# Patient Record
Sex: Female | Born: 1955 | Race: White | Hispanic: No | Marital: Married | Smoking: Never smoker
Health system: Southern US, Community
[De-identification: ages and names within clinical notes are randomized; demographics above are authoritative.]

## PROBLEM LIST (undated history)

## (undated) DIAGNOSIS — D250 Submucous leiomyoma of uterus: Secondary | ICD-10-CM

## (undated) DIAGNOSIS — M7752 Other enthesopathy of left foot: Secondary | ICD-10-CM

## (undated) DIAGNOSIS — G8918 Other acute postprocedural pain: Secondary | ICD-10-CM

## (undated) DIAGNOSIS — C801 Malignant (primary) neoplasm, unspecified: Secondary | ICD-10-CM

## (undated) DIAGNOSIS — D638 Anemia in other chronic diseases classified elsewhere: Secondary | ICD-10-CM

## (undated) DIAGNOSIS — E039 Hypothyroidism, unspecified: Secondary | ICD-10-CM

## (undated) DIAGNOSIS — E119 Type 2 diabetes mellitus without complications: Secondary | ICD-10-CM

## (undated) DIAGNOSIS — N289 Disorder of kidney and ureter, unspecified: Secondary | ICD-10-CM

## (undated) DIAGNOSIS — I10 Essential (primary) hypertension: Secondary | ICD-10-CM

## (undated) DIAGNOSIS — G4730 Sleep apnea, unspecified: Secondary | ICD-10-CM

## (undated) HISTORY — PX: TUMOR REMOVAL: SHX12

---

## 2017-03-27 ENCOUNTER — Encounter

## 2017-04-03 ENCOUNTER — Inpatient Hospital Stay: Admit: 2017-04-03 | Payer: BLUE CROSS/BLUE SHIELD | Attending: Sports Medicine | Primary: "Endocrinology

## 2017-04-03 DIAGNOSIS — Q6689 Other  specified congenital deformities of feet: Secondary | ICD-10-CM

## 2017-07-29 ENCOUNTER — Inpatient Hospital Stay
Admit: 2017-07-29 | Discharge: 2017-07-29 | Disposition: A | Discharge status: Home / Self Care | Payer: BLUE CROSS/BLUE SHIELD | Attending: Emergency Medicine

## 2017-07-29 ENCOUNTER — Emergency Department: Admit: 2017-07-29 | Payer: BLUE CROSS/BLUE SHIELD | Primary: "Endocrinology

## 2017-07-29 DIAGNOSIS — S86811A Strain of other muscle(s) and tendon(s) at lower leg level, right leg, initial encounter: Secondary | ICD-10-CM

## 2017-07-29 MED ORDER — HYDROCODONE-ACETAMINOPHEN 5 MG-325 MG TAB
5-325 mg | ORAL_TABLET | Freq: Three times a day (TID) | ORAL | 0 refills | Status: DC | PRN
Start: 2017-07-29 — End: 2017-08-04

## 2017-07-29 NOTE — ED Provider Notes (Signed)
Patient is a 61 y.o. female presenting with knee injury. The history is provided by the patient.   Knee Injury    This is a new problem. The current episode started yesterday. The problem has been gradually worsening. The pain is present in the right knee. The quality of the pain is described as aching. The pain is moderate. Associated symptoms include limited range of motion. Pertinent negatives include no numbness, no back pain and no neck pain. The symptoms are aggravated by standing. She has tried nothing for the symptoms. There has been no history of extremity trauma.      Patient reports yesterday while exiting a car, she was stepping out of the vehicle when placing right leg on the ground she felt a pop. She was ambulatory however had pain with weightbearing, went to a movie yesterday requiring a cane for ambulation and this morning woke up with significant amount of discomfort. Due to kidney disease she limits her medications. Other than the ice heat  Bio freeze and topical rubs no other agents were used.    No past medical history on file.    No past surgical history on file.      No family history on file.    Social History     Social History   ??? Marital status: MARRIED     Spouse name: N/A   ??? Number of children: N/A   ??? Years of education: N/A     Occupational History   ??? Not on file.     Social History Main Topics   ??? Smoking status: Not on file   ??? Smokeless tobacco: Not on file   ??? Alcohol use Not on file   ??? Drug use: Not on file   ??? Sexual activity: Not on file     Other Topics Concern   ??? Not on file     Social History Narrative         ALLERGIES: Bactrim [sulfamethoprim]; Sulfa (sulfonamide antibiotics); Demerol [meperidine]; Macrobid [nitrofurantoin monohyd/m-cryst]; and Macrodantin [nitrofurantoin macrocrystal]    Review of Systems   Constitutional: Negative for activity change, appetite change, chills, diaphoresis and fatigue.    Respiratory: Negative for apnea, cough, choking and chest tightness.    Musculoskeletal: Positive for gait problem. Negative for back pain, joint swelling, myalgias, neck pain and neck stiffness.   Neurological: Negative for numbness.       Vitals:    07/29/17 1057   BP: 168/78   Pulse: 94   Resp: 18   Temp: 97.7 ??F (36.5 ??C)   SpO2: 96%   Weight: 127 kg (280 lb)   Height: 5\' 8"  (1.727 m)            Physical Exam   Constitutional: She is oriented to person, place, and time. She appears well-developed and well-nourished. No distress.   HENT:   Head: Normocephalic and atraumatic.   Eyes: Pupils are equal, round, and reactive to light.   Musculoskeletal:        Right knee: She exhibits decreased range of motion and swelling. She exhibits no effusion, no ecchymosis, no deformity, no laceration and no erythema. Tenderness found. Lateral joint line tenderness noted. No LCL tenderness noted.   Neurological: She is alert and oriented to person, place, and time.   Skin: Skin is warm and dry. No rash noted. She is not diaphoretic. No erythema. No pallor.   Psychiatric: She has a normal mood and affect. Her behavior is normal. Judgment  and thought content normal.        MDM    Reviewed film results with patient, placed in knee immobilizer and crutches, PRn pain medications and follow up with Orthopedics.   ED Course       Procedures

## 2017-07-29 NOTE — ED Triage Notes (Signed)
Twisted right knee yesterday getting out of car, progressively worsened, tried ice, heat and RX medication. NO relief and this morning unable to bear weight on RLE.

## 2017-07-29 NOTE — ED Notes (Signed)
I have reviewed discharge instructions with the patient.  The patient verbalized understanding. Pt confirmed understanding of need for follow up with primary care provider.  Important sections of discharge instructions highlighted for patient.   Chauncy Lean, RN      Pt is not in any current distress and shows no evidence of clinical instability.   Pt is hemodynamically/respiratorily stable.      Armband removed.    Paperwork given by provider and reviewed with patient had the  opportunity for questions/clarification given.     Pt denies any additional complaints.     Pt was wheeled out of ED.    Patient Vitals for the past 4 hrs:   Temp Pulse Resp BP SpO2   07/29/17 1057 97.7 ??F (36.5 ??C) 94 18 168/78 96 %         Chauncy Lean, RN

## 2017-07-29 NOTE — ED Notes (Signed)
Pt had knee immobilizer placed on right knee, pt had good pms pre and post application. Pt given crutches with instructions.

## 2017-08-04 ENCOUNTER — Inpatient Hospital Stay
Admit: 2017-08-04 | Discharge: 2017-08-05 | Disposition: A | Discharge status: Home / Self Care | Payer: BLUE CROSS/BLUE SHIELD | Attending: Emergency Medicine

## 2017-08-04 DIAGNOSIS — D62 Acute posthemorrhagic anemia: Secondary | ICD-10-CM

## 2017-08-04 DIAGNOSIS — E1129 Type 2 diabetes mellitus with other diabetic kidney complication: Secondary | ICD-10-CM

## 2017-08-04 DIAGNOSIS — E039 Hypothyroidism, unspecified: Secondary | ICD-10-CM | POA: Diagnosis present

## 2017-08-04 LAB — CBC WITH AUTOMATED DIFF
ABS. BASOPHILS: 0 10*3/uL (ref 0.0–0.1)
ABS. EOSINOPHILS: 0.1 10*3/uL (ref 0.0–0.4)
ABS. IMM. GRANS.: 0.1 10*3/uL — ABNORMAL HIGH (ref 0.00–0.04)
ABS. LYMPHOCYTES: 1.2 10*3/uL (ref 0.8–3.5)
ABS. MONOCYTES: 0.7 10*3/uL (ref 0.0–1.0)
ABS. NEUTROPHILS: 11 10*3/uL — ABNORMAL HIGH (ref 1.8–8.0)
ABSOLUTE NRBC: 0 10*3/uL (ref 0.00–0.01)
BASOPHILS: 0 % (ref 0–1)
EOSINOPHILS: 1 % (ref 0–7)
HCT: 21.7 % — ABNORMAL LOW (ref 35.0–47.0)
HGB: 6.6 g/dL — ABNORMAL LOW (ref 11.5–16.0)
IMMATURE GRANULOCYTES: 1 % — ABNORMAL HIGH (ref 0.0–0.5)
LYMPHOCYTES: 9 % — ABNORMAL LOW (ref 12–49)
MCH: 25.4 PG — ABNORMAL LOW (ref 26.0–34.0)
MCHC: 30.4 g/dL (ref 30.0–36.5)
MCV: 83.5 FL (ref 80.0–99.0)
MONOCYTES: 5 % (ref 5–13)
MPV: 9.2 FL (ref 8.9–12.9)
NEUTROPHILS: 84 % — ABNORMAL HIGH (ref 32–75)
NRBC: 0 PER 100 WBC
PLATELET: 406 10*3/uL — ABNORMAL HIGH (ref 150–400)
RBC: 2.6 M/uL — ABNORMAL LOW (ref 3.80–5.20)
RDW: 14.1 % (ref 11.5–14.5)
WBC: 13.1 10*3/uL — ABNORMAL HIGH (ref 3.6–11.0)

## 2017-08-04 LAB — METABOLIC PANEL, BASIC
Anion gap: 6 mmol/L (ref 5–15)
BUN/Creatinine ratio: 20 (ref 12–20)
BUN: 53 MG/DL — ABNORMAL HIGH (ref 6–20)
CO2: 25 mmol/L (ref 21–32)
Calcium: 8.7 MG/DL (ref 8.5–10.1)
Chloride: 101 mmol/L (ref 97–108)
Creatinine: 2.6 MG/DL — ABNORMAL HIGH (ref 0.55–1.02)
GFR est AA: 23 mL/min/{1.73_m2} — ABNORMAL LOW (ref >60)
GFR est non-AA: 19 mL/min/{1.73_m2} — ABNORMAL LOW (ref >60)
Glucose: 360 mg/dL — ABNORMAL HIGH (ref 65–100)
Potassium: 5 mmol/L (ref 3.5–5.1)
Sodium: 132 mmol/L — ABNORMAL LOW (ref 136–145)

## 2017-08-04 MED ORDER — ACETAMINOPHEN 325 MG TABLET
325 mg | ORAL | Status: DC | PRN
Start: 2017-08-04 — End: 2017-08-05
  Administered 2017-08-05: 16:00:00 via ORAL

## 2017-08-04 MED ORDER — ONDANSETRON (PF) 4 MG/2 ML INJECTION
4 mg/2 mL | INTRAMUSCULAR | Status: DC | PRN
Start: 2017-08-04 — End: 2017-08-05

## 2017-08-04 MED ORDER — MORPHINE 4 MG/ML INTRAVENOUS SOLUTION
4 mg/mL | INTRAVENOUS | Status: DC | PRN
Start: 2017-08-04 — End: 2017-08-05

## 2017-08-04 MED ORDER — DEXTROSE 50% IN WATER (D50W) IV SYRG
INTRAVENOUS | Status: DC | PRN
Start: 2017-08-04 — End: 2017-08-05

## 2017-08-04 MED ORDER — INSULIN GLARGINE 100 UNIT/ML INJECTION
100 unit/mL | Freq: Every evening | SUBCUTANEOUS | Status: DC
Start: 2017-08-04 — End: 2017-08-05
  Administered 2017-08-05: 03:00:00 via SUBCUTANEOUS

## 2017-08-04 MED ORDER — LEVOTHYROXINE 125 MCG TAB
125 mcg | Freq: Every day | ORAL | Status: DC
Start: 2017-08-04 — End: 2017-08-05

## 2017-08-04 MED ORDER — SODIUM CHLORIDE 0.9 % IJ SYRG
INTRAMUSCULAR | Status: DC | PRN
Start: 2017-08-04 — End: 2017-08-05

## 2017-08-04 MED ORDER — LIRAGLUTIDE 0.6 MG/0.1 ML (18 MG/3 ML) SUB-Q PEN INJECTOR
0.6 mg/0.1 mL (18 mg/3 mL) | Freq: Every evening | SUBCUTANEOUS | Status: DC
Start: 2017-08-04 — End: 2017-08-05
  Administered 2017-08-05: 03:00:00 via SUBCUTANEOUS

## 2017-08-04 MED ORDER — GLUCOSE 4 GRAM CHEWABLE TAB
4 gram | ORAL | Status: DC | PRN
Start: 2017-08-04 — End: 2017-08-05

## 2017-08-04 MED ORDER — NALOXONE 0.4 MG/ML INJECTION
0.4 mg/mL | INTRAMUSCULAR | Status: DC | PRN
Start: 2017-08-04 — End: 2017-08-05

## 2017-08-04 MED ORDER — GABAPENTIN 100 MG CAP
100 mg | Freq: Two times a day (BID) | ORAL | Status: DC
Start: 2017-08-04 — End: 2017-08-05
  Administered 2017-08-05 (×2): via ORAL

## 2017-08-04 MED ORDER — LOSARTAN 50 MG TAB
50 mg | Freq: Every day | ORAL | Status: DC
Start: 2017-08-04 — End: 2017-08-05

## 2017-08-04 MED ORDER — INSULIN LISPRO 100 UNIT/ML INJECTION
100 unit/mL | Freq: Four times a day (QID) | SUBCUTANEOUS | Status: DC
Start: 2017-08-04 — End: 2017-08-05
  Administered 2017-08-05: 03:00:00 via SUBCUTANEOUS

## 2017-08-04 MED ORDER — LEVOTHYROXINE 75 MCG TAB
75 mcg | Freq: Every day | ORAL | Status: DC
Start: 2017-08-04 — End: 2017-08-05
  Administered 2017-08-05: 11:00:00 via ORAL

## 2017-08-04 MED ORDER — SODIUM CHLORIDE 0.9 % IJ SYRG
Freq: Three times a day (TID) | INTRAMUSCULAR | Status: DC
Start: 2017-08-04 — End: 2017-08-05
  Administered 2017-08-05 (×2): via INTRAVENOUS

## 2017-08-04 MED ORDER — DIPHENHYDRAMINE HCL 50 MG/ML IJ SOLN
50 mg/mL | INTRAMUSCULAR | Status: DC | PRN
Start: 2017-08-04 — End: 2017-08-05

## 2017-08-04 MED ORDER — SODIUM CHLORIDE 0.9 % IV
INTRAVENOUS | Status: DC | PRN
Start: 2017-08-04 — End: 2017-08-05

## 2017-08-04 MED ORDER — GLUCAGON 1 MG INJECTION
1 mg | INTRAMUSCULAR | Status: DC | PRN
Start: 2017-08-04 — End: 2017-08-05

## 2017-08-04 MED ORDER — CHOLECALCIFEROL (VITAMIN D3) 1,000 UNIT (25 MCG) TAB
Freq: Every day | ORAL | Status: DC
Start: 2017-08-04 — End: 2017-08-05
  Administered 2017-08-05: 16:00:00 via ORAL

## 2017-08-04 MED FILL — SODIUM CHLORIDE 0.9 % IV: INTRAVENOUS | Qty: 250

## 2017-08-04 MED FILL — BD POSIFLUSH NORMAL SALINE 0.9 % INJECTION SYRINGE: INTRAMUSCULAR | Qty: 10

## 2017-08-04 NOTE — ED Triage Notes (Signed)
Pt states she started her period yesterday and has been having vaginal bleeding and clots and feels faint. Pt states she has been soaking several pads in an hour.

## 2017-08-04 NOTE — H&P (Signed)
[facility]  [address], [place]  [address]  [phone]    Admission History and Physical      NAME:              Evelyn Jones   DOB:   10-11-1956   MRN:  [id number]     PCP:  Cannon Kettle, MD     Date:     08/04/2017     Chief  Complaint: Fatigue, heavy menstrual bleeding    History Of Presenting Illness:       Evelyn Jones is a 61 y.o. female who is being admitted for symptomatic acute blood loss anemia. Evelyn Jones has a Hx of uterine fibroids and a thickened endometrial lining and follows up with Dr Jackie Plum. She says she started having heavy menstrual bleeding last night consisting of both fresh blood as well as clotted blood. This has not stopped since. She has been feeling weak, dizzy and with a poor exercise tolerance and presented to our Emergency Department today where she was found to have a Hgb of 6.6. She will be admitted for transfusion and a gynecological evaluation.     Allergies   Allergen Reactions   ??? Bactrim [Sulfamethoprim] Anaphylaxis   ??? Sulfa (Sulfonamide Antibiotics) Anaphylaxis   ??? Compazine [Prochlorperazine] Rash   ??? Demerol [Meperidine] Other (comments)     Agitation and nervousness   ??? Keflex [Cephalexin] Hives   ??? Macrobid [Nitrofurantoin Monohyd/M-Cryst] Hives   ??? Macrodantin [Nitrofurantoin Macrocrystal] Hives       Prior to Admission medications    Medication Sig Start Date End Date Taking? Authorizing Provider   loratadine (CLARITIN) 10 mg tablet Take 10 mg by mouth.   Yes Phys Other, MD   levothyroxine (SYNTHROID) 150 mcg tablet Take  by mouth Daily (before breakfast).   Yes Phys Other, MD   valsartan (DIOVAN) 160 mg tablet Take  by mouth daily.   Yes Phys Other, MD   repaglinide (PRANDIN) 2 mg tablet Take 2 mg by mouth Before breakfast, lunch, and dinner.   Yes Phys Other, MD    furosemide (LASIX) 20 mg tablet Take  by mouth daily.   Yes Phys Other, MD   gabapentin (NEURONTIN) 100 mg capsule Take 200 mg by mouth three (3) times daily.   Yes Phys Other, MD   cholecalciferol, vitamin D3, (VITAMIN D3) 2,000 unit tab Take  by mouth.   Yes Phys Other, MD   insulin glargine (LANTUS U-100 INSULIN) 100 unit/mL injection 28 Units by SubCUTAneous route nightly.   Yes Phys Other, MD   Liraglutide (VICTOZA 2-PAK) 0.6 mg/0.1 mL (18 mg/3 mL) pnij 0.6 mg by SubCUTAneous route.   Yes Phys Other, MD       Past Medical History:   Diagnosis Date   ??? Chronic kidney disease     stage 3    ??? DM type 2 (diabetes mellitus, type 2) ([place])    ??? Hypertension    ??? Ill-defined condition     anemia        Past Surgical History:   Procedure Laterality Date   ??? HX CESAREAN SECTION      c section 1993   ??? HX GYN      D&C 1988   ??? HX ORTHOPAEDIC      left knee surgery       Social History   Substance Use Topics   ??? Smoking status: Never Smoker   ???  Smokeless tobacco: Never Used   ??? Alcohol use No        Family History   Problem Relation Age of Onset   ??? Cancer Mother    ??? Cancer Father         Review of Systems:    Constitutional ROS: no fever, chills, rigors or night sweats  Respiratory ROS: no cough, sputum, hemoptysis, dyspnea or pleuritic pain.  Cardiovascular ROS: no chest pain, orthopnea, PND or syncope. + palpitations  Endocrine ROS: no polydispsia, polyuria, heat or cold intolerance or major weight change.  Gastrointestinal ROS: no dysphagia, abdominal pain, nausea, vomiting or diarrhea    Genito-Urinary ROS: no dysuria, frequency, hematuria, retention or flank pain but vaginal bleeding  Musculoskeletal ROS: no joint pain, swelling or muscular tenderness  Neurological ROS: + headache but no confusion, focal weakness or any other neurological symptoms  Psychiatric ROS: no depression, anxiety, mood swings  Dermatological ROS: no rash, pruritis, or urticaria  Heme-Lymph ROS: no swollen glands, bleeding    Examination:     Constitutional:    Visit Vitals   ??? BP 155/86   ??? Pulse (!) 112   ??? Temp 98.6 ??F (37 ??C)   ??? Resp 18   ??? Ht 5\' 8"  (1.727 m)   ??? Wt 130.6 kg (288 lb)   ??? SpO2 95%   ??? BMI 43.79 kg/m2         General:  Weak and ill looking patient in no acute distress  Eyes: Pale conjunctivae, PERRLA with no discharge. Normal eye movements  Ear, Nose, Mouth & Throat: No ottorrhea, rhinorrhea, non tender sinuses, dry mucous membranes  Respiratory:  No accessory muscle use, clear breath sounds without crackles or wheezes  Cardiovascular:  No JVD or murmurs, sinus tachycardia, without thrills, bruits. + peripheral edema.   GI & GU:  Soft abdomen, obese, non-tender, non-distended, normoactive bowel sounds with no palpable organomegaly  Heme:  No cervical or axillary adenopathy.   Musculoskeletal:  No cyanosis, clubbing, atrophy or deformities  Skin:  No rashes, bruising or ulcers   Neurological: Awake and alert, speech is clear, CNs 2-12 are grossly intact and otherwise non focal  Psychiatric:  Has a good insight and is oriented x 3  ________________________________________________________________________    Data Review:    Labs:    Recent Labs      08/04/17   1537   WBC  13.1*   HGB  6.6*   HCT  21.7*   PLT  406*     Recent Labs      08/04/17   1537   NA  132*   K  5.0   CL  101   CO2  25   GLU  360*   BUN  53*   CREA  2.60*   CA  8.7     No components found for: GLPOC  No results for input(s): PH, PCO2, PO2, HCO3, FIO2 in the last 72 hours.  No results for input(s): INR in the last 72 hours.    No lab exists for component: INREXT    Other Medical tests:    Personally reviewed 12 lead EKG - sinus tachycardia with no ischemic changes    I have also reviewed available old medical records.     Assessment & Impression:     Evelyn Jones is a 61 y.o. female being evaluated for:     Active Problems:    CKD (chronic kidney disease) stage 4, GFR 15-29 ml/min (  [place]) (08/04/2017)      Type 2 diabetes mellitus with kidney complication, with long-term  current use of insulin ([place]) (08/04/2017)      Acute blood loss anemia (08/04/2017)      Vaginal bleeding, abnormal (08/04/2017)      HTN (hypertension), benign (08/04/2017)      Hyponatremia (08/04/2017)         Plan of management:    Acute blood loss anemia (08/04/2017): due to vaginal bleeding. She may also have a chronic component to this related to her CKD. Now symptomatic. Admit to hospital. Transfuse 2 units PRBCs. Follow Hgb    Vaginal bleeding, abnormal (08/04/2017): in the setting of uterine fibroids and endometrial thickening by Hx. ED has already discussed with Dr Ferrel Logan, covering for Dr Owens Shark. Team will review patient. Follow Hgb as above    HTN (hypertension), benign (08/04/2017): BP elevated. DC Valsartan due to recall. Start Losartan and follow BP    Hyponatremia (08/04/2017): mild. Maybe related to her CKD and diuretic use. Monitor    Type 2 diabetes mellitus with kidney complication, with long-term current use of insulin ([place]) (08/04/2017): A1c useless in the setting of anemia. Monitor blood glucose, SSI per protocol. DC Prandin. Resume Lantus and perhaps adjust dose, SSI per protocol, diabetic diet    Hypothyroidism (08/04/2017): resume Levothyroxine    CKD (chronic kidney disease) stage 4, GFR 15-29 ml/min ([facility]) (08/04/2017): outpatient follow up.     Code Status:  Full    Surrogate decision maker: Family    Risk of deterioration: high      Total time spent for the care of the patient: [place] discussed with: Patient, Family, Nursing Staff and ED physician    Discussed:  Code Status, Care Plan and D/C Planning    Prophylaxis:  SCD's    Probable Disposition:  Home w/Family           ___________________________________________________    Attending Physician: Wilber Bihari, MD

## 2017-08-04 NOTE — ED Notes (Signed)
TRANSFER - OUT REPORT:    Verbal report given to [place] RN on Evelyn Jones  being transferred to 524 for routine progression of care       Report consisted of patient???s Situation, Background, Assessment and   Recommendations(SBAR).     Information from the following report(s) SBAR, ED Summary, [facility] and Recent Results was reviewed with the receiving nurse.    Opportunity for questions and clarification was provided.

## 2017-08-04 NOTE — Progress Notes (Signed)
Pt. hgb 7.0 post 1st unit of RBC transfusion. RN notifies Dr. Percell Miller. MD states to complete 2nd transfusion now. Will continue to monitor.

## 2017-08-04 NOTE — Progress Notes (Addendum)
[facility]: MED RECONCILIATION    Comments/Recommendations:  ?? Patient reports compliance with current medications. Took last doses this morning.  ?? Hypertension - Patient aware that Claritin D (which contains pseudoephedrine) will adversely affect her BP, but it significantly helps her sinuses. Patient is prescribed valsartan 160 mg PO BID, however she is only taking this once daily currently. She thinks she might have just been taking it once daily by mistake.     Medications added:     ?? Claritin D (12 hour formulation) - only takes once daily usually  ?? Voltaren gel  ?? Biofreeze cream    Medications removed:    ?? Loratadine - takes Claritin D    Medications adjusted:    ?? Vitamin D3 - changed to 5000 units daily  ?? Victoza - changed dose to 1.8 mg QHS  ?? Repaglinide - changed dose to 4 mg (two of the 2 mg tablets) PO TID before meals    Information obtained from: patient, medication list from home, Rx query    Significant PMH/Disease States:   Past Medical History:   Diagnosis Date   ??? Chronic kidney disease     stage 3    ??? DM type 2 (diabetes mellitus, type 2) ([place])    ??? Hypertension    ??? Ill-defined condition     anemia     Chief Complaint for this Admission:   Chief Complaint   Patient presents with   ??? Vaginal Bleeding     Allergies: Bactrim [sulfamethoprim]; Sulfa (sulfonamide antibiotics); Compazine [prochlorperazine]; Demerol [meperidine]; Keflex [cephalexin]; Macrobid [nitrofurantoin monohyd/m-cryst]; and Macrodantin [nitrofurantoin macrocrystal]    Prior to Admission Medications:     Medication Documentation Review Audit       Reviewed by Koleen Distance, PHARMD (Pharmacist) on 08/04/17 at 1708         Medication Sig Documenting Provider Last Dose Status Taking?      cholecalciferol, VITAMIN D3, (VITAMIN D3) 5,000 unit tab tablet Take 5,000 Units by mouth daily (with lunch). Historical Provider 08/03/2017 lunch Active Yes    diclofenac (VOLTAREN) 1 % gel Apply 2 g to affected area two (2) times a  day. Apply to knees and feet Historical Provider 08/04/2017 am Active Yes    furosemide (LASIX) 20 mg tablet Take 20 mg by mouth two (2) times a day. Phys Other, MD 08/04/2017 am Active Yes    gabapentin (NEURONTIN) 100 mg capsule Take 200 mg by mouth two (2) times a day. Phys Other, MD 08/04/2017 am Active Yes    insulin glargine (LANTUS U-100 INSULIN) 100 unit/mL injection 28 Units by SubCUTAneous route nightly. Phys Other, MD 08/03/2017 pm Active Yes    levothyroxine (SYNTHROID) 125 mcg tablet Take 125 mcg by mouth daily (after lunch). Take 150 mcg in the morning and 125 mcg in the afternoon Historical Provider 08/03/2017 1500 Active Yes    levothyroxine (SYNTHROID) 150 mcg tablet Take 150 mcg by mouth Daily (before breakfast). Take 150 mcg in the morning and 125 mcg in the afternoon Phys Other, MD 08/04/2017 am Active Yes    Liraglutide (VICTOZA 2-PAK) 0.6 mg/0.1 mL (18 mg/3 mL) pnij 1.8 mg by SubCUTAneous route nightly. Phys Other, MD 08/03/2017 pm Active Yes             Med Note Glo Herring, BRAD L   Sat Aug 04, 2017  5:05 PM): Patient can provide medication from home ir ordered      loratadine-pseudoephedrine (CLARITIN-D 12 HOUR) 5-120 mg per tablet  Take 1 Tab by mouth daily. Historical Provider 08/04/2017 am Active Yes    menthol (BIOFREEZE, MENTHOL,) 4 % gel by Apply Externally route daily as needed. Historical Provider  Active Yes    repaglinide (PRANDIN) 2 mg tablet Take 4 mg by mouth Before breakfast, lunch, and dinner. Phys Other, MD 08/04/2017 am Active Yes    valsartan (DIOVAN) 160 mg tablet Take 160 mg by mouth daily. Phys Other, MD 08/04/2017 am Active Yes             Med Note Koleen Distance   Sat Aug 04, 2017  5:06 PM): Takes once daily but prescribed twice daily                    Thank you for the consult,  Brad L. Felecia Shelling D, [place]

## 2017-08-04 NOTE — Progress Notes (Signed)
TRANSFER - IN REPORT:    Verbal report received from Amanda(name) on Narissa Beaufort  being received from ED(unit) for routine progression of care      Report consisted of patient???s Situation, Background, Assessment and   Recommendations(SBAR).     Information from the following report(s) SBAR, Kardex, ED Summary, Acc[facility]ion and Recent Results was reviewed with the receiving nurse.    Opportunity for questions and clarification was provided.      Assessment completed upon patient???s arrival to unit and care assumed.

## 2017-08-04 NOTE — Other (Signed)
Primary Nurse Alfonzo Feller and Kait, RN performed a dual skin assessment on this patient No impairment noted  Braden score is 21

## 2017-08-04 NOTE — ED Notes (Signed)
Attempted to call report, unable to receive at this time

## 2017-08-04 NOTE — ED Provider Notes (Signed)
HPI Comments: 61 y.o. female with past medical history significant for hypertension, diabetes, and stage 3 chronic kidney disease who presents from home via a private vehicle with chief complaint of excessive menstrual bleeding. Pt reports she started her menstrual period yesterday around 2300 and has had unusual, excessive amounts of bleeding. Pt reports she has been going through 3-4 soaked pads every hour since yesterday. Furthermore, per spouse, pt has passed well-formed, palm sized blood clots. Pt reports feeling as if she has lost a significant amount of bleed and feels lightheaded. Pt admits she is anemic at baseline, but is unsure of what her hemoglobin is. Pt reports her last normal menstrual period was 4 weeks ago and it lasted 5 days. Pt states she does not typically have heavy periods. Pt denies being on any aspirin or other anticoagulants. Pt denies any nausea, vomiting, vaginal discharge, or syncope.    There are no other acute medical concerns at this time.    Social hx - Tobacco use: none, Alcohol Use: none    OB/GYN: Jackie Plum, MD  Nephrologist: Jinger Neighbors, MD  PCP: Cannon Kettle, MD    Note written by Kemper Durie, Scribe, as dictated by Dennison Mascot, MD 3:14 PM.        The history is provided by the patient and the spouse. No language interpreter was used.        No past medical history on file.    No past surgical history on file.      No family history on file.    Social History     Social History   ??? Marital status: MARRIED     Spouse name: N/A   ??? Number of children: N/A   ??? Years of education: N/A     Occupational History   ??? Not on file.     Social History Main Topics   ??? Smoking status: Not on file   ??? Smokeless tobacco: Not on file   ??? Alcohol use Not on file   ??? Drug use: Not on file   ??? Sexual activity: Not on file     Other Topics Concern   ??? Not on file     Social History Narrative         ALLERGIES: Bactrim [sulfamethoprim]; Sulfa (sulfonamide antibiotics);  Compazine [prochlorperazine]; Demerol [meperidine]; Macrobid [nitrofurantoin monohyd/m-cryst]; and Macrodantin [nitrofurantoin macrocrystal]    Review of Systems   Constitutional: Negative for chills and fever.   HENT: Negative for ear pain and sore throat.    Eyes: Negative for pain.   Respiratory: Negative for chest tightness and shortness of breath.    Cardiovascular: Negative for chest pain and leg swelling.   Gastrointestinal: Negative for abdominal pain, nausea and vomiting.   Genitourinary: Positive for menstrual problem and vaginal bleeding. Negative for dysuria, flank pain and vaginal discharge.   Musculoskeletal: Negative for back pain.   Skin: Negative for rash.   Neurological: Negative for syncope and headaches.   All other systems reviewed and are negative.      Vitals:    08/04/17 1442   BP: 197/88   Pulse: (!) 112   Resp: 18   Temp: 98.6 ??F (37 ??C)   SpO2: 98%   Weight: 130.6 kg (288 lb)   Height: 5\' 8"  (1.727 m)            Physical Exam   Constitutional: She appears well-developed and well-nourished. No distress.   HENT:   Head:  Normocephalic and atraumatic.   Eyes:   Pt has conjunctival pallor.    Neck: Neck supple.   Cardiovascular: Regular rhythm.  Tachycardia present.    Mildly tachycardic.    Pulmonary/Chest: Effort normal. No stridor. No respiratory distress.   Abdominal: Soft. She exhibits no distension. There is no tenderness. There is no rebound and no guarding.   Genitourinary:   Genitourinary Comments: Pelvic: clots present, no pooling, no adnexal tenderness   Musculoskeletal: Normal range of motion.   Neurological: She is alert. Coordination normal.   Skin: Skin is warm and dry.   Psychiatric: She has a normal mood and affect.   Nursing note and vitals reviewed.     Note written by Kemper Durie, Scribe, as dictated by Dennison Mascot, MD 3:15 PM.    MDM    61 y.o. female presents with menstrual bleeding starting yesterday, h/o  CKD and baseline anemia. Hb <7 here warranting transfusion. Discussed with Gyn who recommend treatment of anemia and monitoring but no acute intervention. Hospitalist was consulted for admission and will see the patient in the emergency department.     ED Course       Procedures    ED EKG interpretation:  2:48 PM  Rhythm: sinus tachycardia; and regular . Rate (approx.): 107; no other abnormalities  Note written by Kemper Durie, Scribe, as dictated by Dennison Mascot, MD 3:15 PM.    CONSULT NOTE:  4:14 PM Dennison Mascot, MD spoke with Dr. Dr. Ferrel Logan, Consult for OB/Gynecology.  Discussed available diagnostic tests and clinical findings.  Dr. Ferrel Logan recommends admitting the pt to the hospitalist to treat the anemia.    CONSULT NOTE:  4:17 PM Dennison Mascot, MD spoke with Dr. Odette Horns, Consult for Hospitalist.  Discussed available diagnostic tests and clinical findings.  Dr. Odette Horns will see and admit the pt.     5:45 PM  Patient is being admitted to the hospital.  The results of their tests and reasons for their admission have been discussed with them and/or available family.  They convey agreement and understanding for the need to be admitted and for their admission diagnosis.  Consultation will be made now with the inpatient physician for hospitalization.

## 2017-08-05 LAB — CBC WITH AUTOMATED DIFF
ABS. BASOPHILS: 0.1 10*3/uL (ref 0.0–0.1)
ABS. EOSINOPHILS: 0.4 10*3/uL (ref 0.0–0.4)
ABS. IMM. GRANS.: 0.1 10*3/uL — ABNORMAL HIGH (ref 0.00–0.04)
ABS. LYMPHOCYTES: 2.4 10*3/uL (ref 0.8–3.5)
ABS. MONOCYTES: 1 10*3/uL (ref 0.0–1.0)
ABS. NEUTROPHILS: 7.9 10*3/uL (ref 1.8–8.0)
ABSOLUTE NRBC: 0 10*3/uL (ref 0.00–0.01)
BASOPHILS: 0 % (ref 0–1)
EOSINOPHILS: 3 % (ref 0–7)
HCT: 25.8 % — ABNORMAL LOW (ref 35.0–47.0)
HGB: 8.1 g/dL — ABNORMAL LOW (ref 11.5–16.0)
IMMATURE GRANULOCYTES: 1 % — ABNORMAL HIGH (ref 0.0–0.5)
LYMPHOCYTES: 20 % (ref 12–49)
MCH: 26.4 PG (ref 26.0–34.0)
MCHC: 31.4 g/dL (ref 30.0–36.5)
MCV: 84 FL (ref 80.0–99.0)
MONOCYTES: 9 % (ref 5–13)
MPV: 9.4 FL (ref 8.9–12.9)
NEUTROPHILS: 67 % (ref 32–75)
NRBC: 0 PER 100 WBC
PLATELET: 388 10*3/uL (ref 150–400)
RBC: 3.07 M/uL — ABNORMAL LOW (ref 3.80–5.20)
RDW: 14.1 % (ref 11.5–14.5)
WBC: 11.9 10*3/uL — ABNORMAL HIGH (ref 3.6–11.0)

## 2017-08-05 LAB — IRON PROFILE
Iron % saturation: 65 % — ABNORMAL HIGH (ref 20–50)
Iron: 191 ug/dL — ABNORMAL HIGH (ref 35–150)
TIBC: 292 ug/dL (ref 250–450)

## 2017-08-05 LAB — METABOLIC PANEL, COMPREHENSIVE
A-G Ratio: 0.7 — ABNORMAL LOW (ref 1.1–2.2)
ALT (SGPT): 22 U/L (ref 12–78)
AST (SGOT): 16 U/L (ref 15–37)
Albumin: 2.5 g/dL — ABNORMAL LOW (ref 3.5–5.0)
Alk. phosphatase: 105 U/L (ref 45–117)
Anion gap: 9 mmol/L (ref 5–15)
BUN/Creatinine ratio: 20 (ref 12–20)
BUN: 50 MG/DL — ABNORMAL HIGH (ref 6–20)
Bilirubin, total: 0.5 MG/DL (ref 0.2–1.0)
CO2: 23 mmol/L (ref 21–32)
Calcium: 8.3 MG/DL — ABNORMAL LOW (ref 8.5–10.1)
Chloride: 103 mmol/L (ref 97–108)
Creatinine: 2.51 MG/DL — ABNORMAL HIGH (ref 0.55–1.02)
GFR est AA: 24 mL/min/{1.73_m2} — ABNORMAL LOW (ref >60)
GFR est non-AA: 20 mL/min/{1.73_m2} — ABNORMAL LOW (ref >60)
Globulin: 3.7 g/dL (ref 2.0–4.0)
Glucose: 208 mg/dL — ABNORMAL HIGH (ref 65–100)
Potassium: 4.7 mmol/L (ref 3.5–5.1)
Protein, total: 6.2 g/dL — ABNORMAL LOW (ref 6.4–8.2)
Sodium: 135 mmol/L — ABNORMAL LOW (ref 136–145)

## 2017-08-05 LAB — GLUCOSE, POC
Glucose (POC): 328 mg/dL — ABNORMAL HIGH (ref 65–100)
Glucose (POC): 250 mg/dL — ABNORMAL HIGH (ref 65–100)
Glucose (POC): 250 mg/dL — ABNORMAL HIGH (ref 65–100)

## 2017-08-05 LAB — HEMOGLOBIN A1C WITH EAG
Est. average glucose: 246 mg/dL
Hemoglobin A1c: 10.2 % — ABNORMAL HIGH (ref 4.2–6.3)

## 2017-08-05 LAB — FERRITIN: Ferritin: 21 NG/ML (ref 8–252)

## 2017-08-05 LAB — HGB & HCT
HCT: 22.3 % — ABNORMAL LOW (ref 35.0–47.0)
HGB: 7 g/dL — ABNORMAL LOW (ref 11.5–16.0)

## 2017-08-05 LAB — MAGNESIUM: Magnesium: 2 mg/dL (ref 1.6–2.4)

## 2017-08-05 LAB — PHOSPHORUS: Phosphorus: 3.6 MG/DL (ref 2.6–4.7)

## 2017-08-05 MED ORDER — FUROSEMIDE 20 MG TAB
20 mg | Freq: Two times a day (BID) | ORAL | Status: DC
Start: 2017-08-05 — End: 2017-08-05

## 2017-08-05 MED ORDER — HYDRALAZINE 20 MG/ML IJ SOLN
20 mg/mL | Freq: Four times a day (QID) | INTRAMUSCULAR | Status: DC | PRN
Start: 2017-08-05 — End: 2017-08-05

## 2017-08-05 MED ORDER — LABETALOL 200 MG TAB
200 mg | Freq: Two times a day (BID) | ORAL | Status: DC
Start: 2017-08-05 — End: 2017-08-05
  Administered 2017-08-05: 13:00:00 via ORAL

## 2017-08-05 MED ORDER — INSULIN LISPRO 100 UNIT/ML INJECTION
100 unit/mL | Freq: Four times a day (QID) | SUBCUTANEOUS | Status: DC
Start: 2017-08-05 — End: 2017-08-05
  Administered 2017-08-05 (×2): via SUBCUTANEOUS

## 2017-08-05 MED ORDER — REPAGLINIDE 1 MG TAB
1 mg | Freq: Three times a day (TID) | ORAL | Status: DC
Start: 2017-08-05 — End: 2017-08-05

## 2017-08-05 MED ORDER — IRON SUCROSE 100 MG/5 ML IV SOLN
100 mg iron/5 mL | Freq: Once | INTRAVENOUS | Status: AC
Start: 2017-08-05 — End: 2017-08-05
  Administered 2017-08-05: 16:00:00 via INTRAVENOUS

## 2017-08-05 MED ORDER — LABETALOL 5 MG/ML IV SYRINGE
20 mg/4 mL (5 mg/mL) | INTRAVENOUS | Status: DC | PRN
Start: 2017-08-05 — End: 2017-08-05

## 2017-08-05 MED ORDER — LOSARTAN 50 MG TAB
50 mg | ORAL_TABLET | Freq: Every day | ORAL | 1 refills | Status: DC
Start: 2017-08-05 — End: 2017-09-27

## 2017-08-05 MED ORDER — SODIUM CHLORIDE 0.9 % IV
INTRAVENOUS | Status: DC
Start: 2017-08-05 — End: 2017-08-05
  Administered 2017-08-05: 13:00:00 via INTRAVENOUS

## 2017-08-05 MED ORDER — LABETALOL 200 MG TAB
200 mg | ORAL_TABLET | Freq: Two times a day (BID) | ORAL | 1 refills | Status: DC
Start: 2017-08-05 — End: 2017-09-27

## 2017-08-05 MED FILL — INSULIN LISPRO 100 UNIT/ML INJECTION: 100 unit/mL | SUBCUTANEOUS | Qty: 1

## 2017-08-05 MED FILL — GLUCAGEN DIAGNOSTIC KIT 1 MG/ML INJECTION: 1 mg/mL | INTRAMUSCULAR | Qty: 1

## 2017-08-05 MED FILL — GABAPENTIN 100 MG CAP: 100 mg | ORAL | Qty: 2

## 2017-08-05 MED FILL — MAPAP (ACETAMINOPHEN) 325 MG TABLET: 325 mg | ORAL | Qty: 2

## 2017-08-05 MED FILL — SODIUM CHLORIDE 0.9 % IV: INTRAVENOUS | Qty: 250

## 2017-08-05 MED FILL — LEVOTHYROXINE 75 MCG TAB: 75 mcg | ORAL | Qty: 2

## 2017-08-05 MED FILL — INSULIN GLARGINE 100 UNIT/ML INJECTION: 100 unit/mL | SUBCUTANEOUS | Qty: 1

## 2017-08-05 MED FILL — VENOFER 100 MG IRON/5 ML INTRAVENOUS SOLUTION: 100 mg iron/5 mL | INTRAVENOUS | Qty: 5

## 2017-08-05 MED FILL — VITAMIN D3 25 MCG (1,000 UNIT) TABLET: 25 mcg (1,000 unit) | ORAL | Qty: 5

## 2017-08-05 MED FILL — LABETALOL 200 MG TAB: 200 mg | ORAL | Qty: 1

## 2017-08-05 NOTE — Progress Notes (Signed)
I have reviewed discharge instructions with the patient.  The patient verbalized understanding. Opportunity for question and answer provided. Patient left via wheelchair with spouse in no distress.

## 2017-08-05 NOTE — Discharge Summary (Addendum)
[facility]  [place], [place] [address]  [phone]    Physician Discharge Summary     Patient ID:  Evelyn Jones  [id number]  61 y.o.  [birth year]    Admit date: 08/04/2017    Discharge date and time: 08/05/2017    Admission Diagnoses: Acute blood loss anemia    Discharge Diagnoses:  Principal Diagnosis Acute blood loss anemia                                            Principal Problem:    Acute blood loss anemia (08/04/2017)    Active Problems:    CKD (chronic kidney disease) stage 4, GFR 15-29 ml/min (HCC) (08/04/2017)      Type 2 diabetes mellitus with kidney complication, with long-term current use of insulin ([place]) (08/04/2017)      Vaginal bleeding, abnormal (08/04/2017)      HTN (hypertension), benign (08/04/2017)      Hyponatremia (08/04/2017)      Hypothyroidism (08/04/2017)           Hospital Course:     61 yo hx of HTN, DM, CKD 4, uterine fibroids, presented w/ vaginal bleeding, blood loss anemia    1) Abnormal vaginal bleeding/uterine fibroids: bleeding now better.  Patient will f/u with her OB/GYN (Dr. Jackie Plum) this week    2) Acute blood loss anemia: due to vaginal bleeding. She may also have a chronic component to this related to her CKD.  S/p 2u RBCs.  Also checking iron levels.  Give IV iron x1 dose prior to discharge.  Hgb was stable on discharge.  patient will cont home iron supplements.  Repeat CBC with PCP or nephrologist   ??  3) HTN (hypertension), benign: uncontrolled.  Valsartan was stopped due to recall.  Will start losartan.  Also started oral labetalol  ??  4) Hyponatremia: due to volume depletion  ??  5) Type 2 diabetes mellitus with kidney complication, with long-term current use of insulin: A1c useless in the setting of anemia.  Cont home Lantus, Victoza  ??  6) Hypothyroidism: resume Levothyroxine  ??  7) CKD (chronic kidney disease) stage 4, GFR 15-29 ml/min: follows by Dr. Garner Gavel.  Recheck BMP as an outpatient     PCP: Cannon Kettle, MD      Consults: GYN    Significant Diagnostic Studies: blood transfusions    Discharge Exam:  Physical Exam:    Gen: Well-developed, well-nourished, morbidly obese, in no acute distress  HEENT:  Pink conjunctivae, PERRL, hearing intact to voice, moist mucous membranes  Neck: Supple, without masses, thyroid non-tender  Resp: No accessory muscle use, clear breath sounds without wheezes rales or rhonchi  Card: No murmurs, normal S1, S2 without thrills, bruits or peripheral edema  Abd:  Soft, non-tender, non-distended, normoactive bowel sounds are present  Lymph:  No cervical or inguinal adenopathy  Musc: No cyanosis or clubbing  Skin: No rashes  Neuro:  Cranial nerves are grossly intact, no focal motor weakness, follows commands appropriately  Psych:  Good insight, oriented to person, place and time, alert    Disposition: home  Discharge Condition: Stable    Patient Instructions:   Current Discharge Medication List      START taking these medications    Details   labetalol (NORMODYNE) 200 mg tablet Take 1 Tab  by mouth every twelve (12) hours.  Qty: 60 Tab, Refills: 1      losartan (COZAAR) 50 mg tablet Take 1 Tab by mouth daily.  Qty: 30 Tab, Refills: 1         CONTINUE these medications which have NOT CHANGED    Details   !! levothyroxine (SYNTHROID) 150 mcg tablet Take 150 mcg by mouth Daily (before breakfast). Take 150 mcg in the morning and 125 mcg in the afternoon      repaglinide (PRANDIN) 2 mg tablet Take 4 mg by mouth Before breakfast, lunch, and dinner.      furosemide (LASIX) 20 mg tablet Take 20 mg by mouth two (2) times a day.      gabapentin (NEURONTIN) 100 mg capsule Take 200 mg by mouth two (2) times a day.      insulin glargine (LANTUS U-100 INSULIN) 100 unit/mL injection 28 Units by SubCUTAneous route nightly.      Liraglutide (VICTOZA 2-PAK) 0.6 mg/0.1 mL (18 mg/3 mL) pnij 1.8 mg by SubCUTAneous route nightly.      cholecalciferol, VITAMIN D3, (VITAMIN D3) 5,000 unit tab tablet Take 5,000  Units by mouth daily (with lunch).      !! levothyroxine (SYNTHROID) 125 mcg tablet Take 125 mcg by mouth daily (after lunch). Take 150 mcg in the morning and 125 mcg in the afternoon      loratadine-pseudoephedrine (CLARITIN-D 12 HOUR) 5-120 mg per tablet Take 1 Tab by mouth daily.      diclofenac (VOLTAREN) 1 % gel Apply 2 g to affected area two (2) times a day. Apply to knees and feet      menthol (BIOFREEZE, MENTHOL,) 4 % gel by Apply Externally route daily as needed.       !! - Potential duplicate medications found. Please discuss with provider.      STOP taking these medications       valsartan (DIOVAN) 160 mg tablet Comments:   Reason for Stopping:             Activity: Activity as tolerated  Diet: Regular Diet  Wound Care: None needed    Follow-up with  Follow-up Information     Follow up With Details Comments Contact Info    Victoza - patient's own medication  Victoza - patient's own medication. Return to patient at discharge.     Rebekah [place], MD Schedule an appointment as soon as possible for a visit in 1 week  [address]  [place] [address]  [phone]      Hilda Blades, MD Schedule an appointment as soon as possible for a visit in 2 days  [place] [address]  [phone]      Edmonia Gair, MD Schedule an appointment as soon as possible for a visit in 1 week  [address]  [address]  [place] [address]  [phone]            Follow-up tests/labs: CBC, iron panel    Signed:  Brynda Greathouse, MD  08/05/2017  10:55 AM    I spent 36 min on discharge

## 2017-08-05 NOTE — Discharge Summary (Signed)
[facility]  [place], [place] [address]  [phone]    Physician Discharge Summary     Patient ID:  Evelyn Jones  [id number]  61 y.o.  December 26, 1955    Admit date: 08/04/2017    Discharge date and time: 08/05/2017    Admission Diagnoses: Acute blood loss anemia    Discharge Diagnoses:  Principal Diagnosis Acute blood loss anemia                                            Principal Problem:    Acute blood loss anemia (08/04/2017)    Active Problems:    CKD (chronic kidney disease) stage 4, GFR 15-29 ml/min (HCC) (08/04/2017)      Type 2 diabetes mellitus with kidney complication, with long-term current use of insulin ([place]) (08/04/2017)      Vaginal bleeding, abnormal (08/04/2017)      HTN (hypertension), benign (08/04/2017)      Hyponatremia (08/04/2017)      Hypothyroidism (08/04/2017)           Hospital Course:     61 yo hx of HTN, DM, CKD 4, uterine fibroids, presented w/ vaginal bleeding, blood loss anemia    1) Abnormal vaginal bleeding/uterine fibroids: bleeding now better.  Patient will f/u with her OB/GYN (Dr. Jackie Plum) this week    2) Acute blood loss anemia: due to vaginal bleeding. She may also have a chronic component to this related to her CKD.  S/p 2u RBCs.  Also checking iron levels.  Give IV iron x1 dose prior to discharge.  Hgb was stable on discharge.  patient will cont home iron supplements.  Repeat CBC with PCP or nephrologist   ??  3) HTN (hypertension), benign: uncontrolled.  Valsartan was stopped due to recall.  Will start losartan.  Also started oral labetalol  ??  4) Hyponatremia: due to volume depletion  ??  5) Type 2 diabetes mellitus with kidney complication, with long-term current use of insulin: A1c useless in the setting of anemia.  Cont home Lantus, Victoza  ??  6) Hypothyroidism: resume Levothyroxine  ??  7) CKD (chronic kidney disease) stage 4, GFR 15-29 ml/min: follows by Dr. Garner Gavel.  Recheck BMP as an outpatient     PCP: Cannon Kettle, MD     Consults:  GYN    Significant Diagnostic Studies: blood transfusions    Discharge Exam:  Physical Exam:    Gen: Well-developed, well-nourished, morbidly obese, in no acute distress  HEENT:  Pink conjunctivae, PERRL, hearing intact to voice, moist mucous membranes  Neck: Supple, without masses, thyroid non-tender  Resp: No accessory muscle use, clear breath sounds without wheezes rales or rhonchi  Card: No murmurs, normal S1, S2 without thrills, bruits or peripheral edema  Abd:  Soft, non-tender, non-distended, normoactive bowel sounds are present  Lymph:  No cervical or inguinal adenopathy  Musc: No cyanosis or clubbing  Skin: No rashes  Neuro:  Cranial nerves are grossly intact, no focal motor weakness, follows commands appropriately  Psych:  Good insight, oriented to person, place and time, alert    Disposition: home  Discharge Condition: Stable    Patient Instructions:   Current Discharge Medication List      START taking these medications    Details   labetalol (NORMODYNE) 200 mg tablet Take 1 Tab  by mouth every twelve (12) hours.  Qty: 60 Tab, Refills: 1      losartan (COZAAR) 50 mg tablet Take 1 Tab by mouth daily.  Qty: 30 Tab, Refills: 1         CONTINUE these medications which have NOT CHANGED    Details   !! levothyroxine (SYNTHROID) 150 mcg tablet Take 150 mcg by mouth Daily (before breakfast). Take 150 mcg in the morning and 125 mcg in the afternoon      repaglinide (PRANDIN) 2 mg tablet Take 4 mg by mouth Before breakfast, lunch, and dinner.      furosemide (LASIX) 20 mg tablet Take 20 mg by mouth two (2) times a day.      gabapentin (NEURONTIN) 100 mg capsule Take 200 mg by mouth two (2) times a day.      insulin glargine (LANTUS U-100 INSULIN) 100 unit/mL injection 28 Units by SubCUTAneous route nightly.      Liraglutide (VICTOZA 2-PAK) 0.6 mg/0.1 mL (18 mg/3 mL) pnij 1.8 mg by SubCUTAneous route nightly.      cholecalciferol, VITAMIN D3, (VITAMIN D3) 5,000 unit tab tablet Take 5,000 Units by mouth daily (with  lunch).      !! levothyroxine (SYNTHROID) 125 mcg tablet Take 125 mcg by mouth daily (after lunch). Take 150 mcg in the morning and 125 mcg in the afternoon      loratadine-pseudoephedrine (CLARITIN-D 12 HOUR) 5-120 mg per tablet Take 1 Tab by mouth daily.      diclofenac (VOLTAREN) 1 % gel Apply 2 g to affected area two (2) times a day. Apply to knees and feet      menthol (BIOFREEZE, MENTHOL,) 4 % gel by Apply Externally route daily as needed.       !! - Potential duplicate medications found. Please discuss with provider.      STOP taking these medications       valsartan (DIOVAN) 160 mg tablet Comments:   Reason for Stopping:             Activity: Activity as tolerated  Diet: Regular Diet  Wound Care: None needed    Follow-up with  Follow-up Information     Follow up With Details Comments Contact Info    Victoza - patient's own medication  Victoza - patient's own medication. Return to patient at discharge.     Rebekah [place], MD Schedule an appointment as soon as possible for a visit in 1 week  [address]  [place] [address]  [phone]      Hilda Blades, MD Schedule an appointment as soon as possible for a visit in 2 days  [place] [address]  [phone]      Edmonia Oshana, MD Schedule an appointment as soon as possible for a visit in 1 week  [address]  [address]  [place] [address]  [phone]            Follow-up tests/labs: CBC, iron panel    Signed:  Brynda Greathouse, MD  08/05/2017  10:55 AM    I spent 36 min on discharge

## 2017-08-06 LAB — TYPE AND SCREEN
ABO/Rh: A POS
Antibody Screen: NEGATIVE
Status: TRANSFUSED
Status: TRANSFUSED
Unit Divison: 0
Unit Divison: 0
Unit Divison: 0

## 2017-08-06 LAB — TYPE & SCREEN
ABO/Rh(D): A POS
Antibody screen: NEGATIVE
Status of unit: TRANSFUSED
Status of unit: TRANSFUSED
Unit division: 0
Unit division: 0
Unit division: 0

## 2017-08-06 LAB — CULTURE, MRSA

## 2017-08-07 LAB — EKG 12-LEAD
Atrial Rate: 107 {beats}/min
P Axis: 49 degrees
P-R Interval: 144 ms
Q-T Interval: 336 ms
QRS Duration: 90 ms
QTc Calculation (Bazett): 448 ms
R Axis: 20 degrees
T Axis: 26 degrees
Ventricular Rate: 107 {beats}/min

## 2017-08-07 LAB — EKG, 12 LEAD, INITIAL
Atrial Rate: 107 {beats}/min
Calculated P Axis: 49 degrees
Calculated R Axis: 20 degrees
Calculated T Axis: 26 degrees
P-R Interval: 144 ms
Q-T Interval: 336 ms
QRS Duration: 90 ms
QTC Calculation (Bezet): 448 ms
Ventricular Rate: 107 {beats}/min

## 2017-09-27 ENCOUNTER — Inpatient Hospital Stay: Admit: 2017-09-27 | Payer: BLUE CROSS/BLUE SHIELD | Primary: "Endocrinology

## 2017-09-27 ENCOUNTER — Inpatient Hospital Stay: Admit: 2017-09-27 | Payer: BLUE CROSS/BLUE SHIELD | Attending: Obstetrics & Gynecology | Primary: "Endocrinology

## 2017-09-27 DIAGNOSIS — Z01818 Encounter for other preprocedural examination: Secondary | ICD-10-CM

## 2017-09-27 LAB — TYPE AND SCREEN
ABO/Rh: A POS
Antibody Screen: NEGATIVE

## 2017-09-27 LAB — METABOLIC PANEL, COMPREHENSIVE
A-G Ratio: 0.8 — ABNORMAL LOW (ref 1.1–2.2)
ALT (SGPT): 24 U/L (ref 12–78)
AST (SGOT): 15 U/L (ref 15–37)
Albumin: 3 g/dL — ABNORMAL LOW (ref 3.5–5.0)
Alk. phosphatase: 132 U/L — ABNORMAL HIGH (ref 45–117)
Anion gap: 9 mmol/L (ref 5–15)
BUN/Creatinine ratio: 18 (ref 12–20)
BUN: 47 MG/DL — ABNORMAL HIGH (ref 6–20)
Bilirubin, total: 0.1 MG/DL — ABNORMAL LOW (ref 0.2–1.0)
CO2: 24 mmol/L (ref 21–32)
Calcium: 9.2 MG/DL (ref 8.5–10.1)
Chloride: 104 mmol/L (ref 97–108)
Creatinine: 2.57 MG/DL — ABNORMAL HIGH (ref 0.55–1.02)
GFR est AA: 23 mL/min/{1.73_m2} — ABNORMAL LOW (ref >60)
GFR est non-AA: 19 mL/min/{1.73_m2} — ABNORMAL LOW (ref >60)
Globulin: 3.8 g/dL (ref 2.0–4.0)
Glucose: 279 mg/dL — ABNORMAL HIGH (ref 65–100)
Potassium: 4.6 mmol/L (ref 3.5–5.1)
Protein, total: 6.8 g/dL (ref 6.4–8.2)
Sodium: 137 mmol/L (ref 136–145)

## 2017-09-27 LAB — CBC WITH AUTOMATED DIFF
ABS. BASOPHILS: 0 10*3/uL (ref 0.0–0.1)
ABS. EOSINOPHILS: 0.2 10*3/uL (ref 0.0–0.4)
ABS. IMM. GRANS.: 0 10*3/uL (ref 0.00–0.04)
ABS. LYMPHOCYTES: 1.7 10*3/uL (ref 0.8–3.5)
ABS. MONOCYTES: 0.9 10*3/uL (ref 0.0–1.0)
ABS. NEUTROPHILS: 8.8 10*3/uL — ABNORMAL HIGH (ref 1.8–8.0)
ABSOLUTE NRBC: 0 10*3/uL (ref 0.00–0.01)
BASOPHILS: 0 % (ref 0–1)
EOSINOPHILS: 2 % (ref 0–7)
HCT: 32.8 % — ABNORMAL LOW (ref 35.0–47.0)
HGB: 9.9 g/dL — ABNORMAL LOW (ref 11.5–16.0)
IMMATURE GRANULOCYTES: 0 % (ref 0.0–0.5)
LYMPHOCYTES: 15 % (ref 12–49)
MCH: 27.3 PG (ref 26.0–34.0)
MCHC: 30.2 g/dL (ref 30.0–36.5)
MCV: 90.6 FL (ref 80.0–99.0)
MONOCYTES: 8 % (ref 5–13)
MPV: 10.2 FL (ref 8.9–12.9)
NEUTROPHILS: 75 % (ref 32–75)
NRBC: 0 PER 100 WBC
PLATELET: 359 10*3/uL (ref 150–400)
RBC: 3.62 M/uL — ABNORMAL LOW (ref 3.80–5.20)
RDW: 16.1 % — ABNORMAL HIGH (ref 11.5–14.5)
WBC: 11.7 10*3/uL — ABNORMAL HIGH (ref 3.6–11.0)

## 2017-09-27 LAB — TYPE & SCREEN
ABO/Rh(D): A POS
Antibody screen: NEGATIVE

## 2017-09-27 NOTE — Other (Signed)
[place]   [address]    MAIN OR [phone]    MAIN PRE OP [phone]    AMBULATORY PRE OP [phone]    PRE-ADMISSION TESTING [phone]       Surgery Date:   10/05/17      Is surgery arrival time given by surgeon?  NO  If ???NO???, [facility] staff will call you between 3 and 7pm the day before your surgery with your arrival time. (If your surgery is on a Monday, we will call you the Friday before.)    Call [phone] after 7pm Monday-Friday if you did not receive your arrival time.    Answers to Common Questions   When You  Arrive   Arrive at the [facility] on the day of your surgery  Have your insurance card, photo ID, and any copayment (if needed)     Food   and   Drink   NO food or drink after midnight the night before surgery    This means NO water, gum, mints, coffee, juice, etc.  No alcohol (beer, wine, liquor) 24 hours before and after surgery     Medicine to   TAKE   Morning of Surgery   MEDICATIONS TO TAKE THE MORNING OF SURGERY WITH A SIP OF WATER:   ? Neurontin,Synthroid  Patient will call Dr.Wigand for insulin instructions   Medicine  To  STOP   FOR PAIN  ? You can take Tylenol ??? follow instructions on the bottle  ? NO Aspirin for pain   ? NO Non-Steroidal Anti-Inflammatory Drugs (NSAID???s:   for example, Ibuprofen (Advil, Motrin), Naproxen (Aleve)  ? STOP herbal supplements and vitamins 1 week before surgery     Blood  Thinners   ? If you take Aspirin, Plavix, Coumadin, blood-thinning or anti-clot medicine, talk to your surgeon and/or follow the instructions from the doctor who told you to take that [place]  ? Wear loose, comfortable clothes  ? Wear glasses instead of contacts  ? Leave money, jewelry and valuables at home  ? No make-up, particularly mascara, the day of surgery   ? REMOVE ALL piercings, rings, and jewelry - leave at home  ? Wear hair loose or down; no pony-tails, buns, or metal hair clips    BATHING  ? Follow all special bath instructions (for total joint replacement, spine and bowel surgeries.)  ? If you shower the morning of surgery, please do not apply any lotions, powders, or deodorants afterwards.  Do not shave or trim anywhere 24 hours before surgery.     Going Home  or  Spending the Night   ? SAME-DAY SURGERY: You must have a responsible adult drive you home and stay with you 24 hours after surgery  ? ADMITS: If your doctor is keeping you into the hospital after surgery, leave personal belongings/luggage in your car until you have a hospital room number.    Hospital discharge time is 12 noon  Drivers must be here before 12 noon unless you are told differently       Follow all instructions so your surgery won???t be cancelled.  Please, be on time.  If a situation occurs and you are delayed the day of surgery, call [phone] .    If your physical  condition changes (like a fever, cold, flu, etc.) call your surgeon as soon as possible.      The Preadmission Testing staff can be reached at [phone].    OTHER SPECIAL INSTRUCTIONS:  Free  Valet parking,Bring completed PTA medication list with you on the day of your surgery.    The patient was contacted  in person.   She  verbalize  understanding of all instructions and does not  need reinforcement.

## 2017-09-27 NOTE — Other (Signed)
Ts plan faxed to Dr. Dutch Gray for Nasal swab result.

## 2017-09-27 NOTE — Other (Signed)
Patient with a history of +MRSA in groin in 2017.  Order for contact isolation placed in connect care under multiphase, sign and held orders for the day of surgery.  DOS: 10/05/2017

## 2017-09-28 ENCOUNTER — Encounter: Primary: "Endocrinology

## 2017-09-29 LAB — CULTURE, MRSA

## 2017-10-05 ENCOUNTER — Ambulatory Visit

## 2017-10-05 ENCOUNTER — Inpatient Hospital Stay: Payer: BLUE CROSS/BLUE SHIELD

## 2017-10-05 LAB — TYPE AND SCREEN
ABO/Rh: A POS
Antibody Screen: NEGATIVE

## 2017-10-05 LAB — GLUCOSE, POC
Glucose (POC): 283 mg/dL — ABNORMAL HIGH (ref 65–100)
Glucose (POC): 196 mg/dL — ABNORMAL HIGH (ref 65–100)

## 2017-10-05 LAB — TYPE & SCREEN
ABO/Rh(D): A POS
Antibody screen: NEGATIVE

## 2017-10-05 LAB — HCG URINE, QL. - POC: Pregnancy test,urine (POC): NEGATIVE

## 2017-10-05 MED ORDER — FAMOTIDINE (PF) 20 MG/2 ML IV
20 mg/2 mL | INTRAVENOUS | Status: AC
Start: 2017-10-05 — End: ?

## 2017-10-05 MED ORDER — GLYCOPYRROLATE 0.2 MG/ML IJ SOLN
0.2 mg/mL | INTRAMUSCULAR | Status: DC | PRN
Start: 2017-10-05 — End: 2017-10-05
  Administered 2017-10-05: 15:00:00 via INTRAVENOUS

## 2017-10-05 MED ORDER — LIDOCAINE (PF) 20 MG/ML (2 %) IV SYRINGE
100 mg/5 mL (2 %) | INTRAVENOUS | Status: AC
Start: 2017-10-05 — End: ?

## 2017-10-05 MED ORDER — ESMOLOL 10 MG/ML IV SOLN
100 mg/10 mL (10 mg/mL) | INTRAVENOUS | Status: AC
Start: 2017-10-05 — End: ?

## 2017-10-05 MED ORDER — MIDAZOLAM 1 MG/ML IJ SOLN
1 mg/mL | INTRAMUSCULAR | Status: DC | PRN
Start: 2017-10-05 — End: 2017-10-05
  Administered 2017-10-05: 14:00:00 via INTRAVENOUS

## 2017-10-05 MED ORDER — MIDAZOLAM 1 MG/ML IJ SOLN
1 mg/mL | INTRAMUSCULAR | Status: AC
Start: 2017-10-05 — End: ?

## 2017-10-05 MED ORDER — ONDANSETRON (PF) 4 MG/2 ML INJECTION
4 mg/2 mL | INTRAMUSCULAR | Status: DC | PRN
Start: 2017-10-05 — End: 2017-10-05
  Administered 2017-10-05: 14:00:00 via INTRAVENOUS

## 2017-10-05 MED ORDER — SODIUM CHLORIDE 0.9 % IJ SYRG
INTRAMUSCULAR | Status: DC | PRN
Start: 2017-10-05 — End: 2017-10-05

## 2017-10-05 MED ORDER — DEXAMETHASONE SODIUM PHOSPHATE 4 MG/ML IJ SOLN
4 mg/mL | INTRAMUSCULAR | Status: AC
Start: 2017-10-05 — End: ?

## 2017-10-05 MED ORDER — ROCURONIUM 10 MG/ML IV
10 mg/mL | INTRAVENOUS | Status: DC | PRN
Start: 2017-10-05 — End: 2017-10-05
  Administered 2017-10-05 (×2): via INTRAVENOUS

## 2017-10-05 MED ORDER — CLINDAMYCIN PHOSPHATE 150 MG/ML IJ SOLN
150 mg/mL | INTRAMUSCULAR | Status: AC
Start: 2017-10-05 — End: ?

## 2017-10-05 MED ORDER — GLYCOPYRROLATE 0.2 MG/ML IJ SOLN
0.2 mg/mL | INTRAMUSCULAR | Status: AC
Start: 2017-10-05 — End: ?

## 2017-10-05 MED ORDER — FENTANYL CITRATE (PF) 50 MCG/ML IJ SOLN
50 mcg/mL | INTRAMUSCULAR | Status: DC | PRN
Start: 2017-10-05 — End: 2017-10-05
  Administered 2017-10-05 (×3): via INTRAVENOUS

## 2017-10-05 MED ORDER — SILVER NITRATE APPLICATORS 75 %-25 % TOPICAL STICK
75-25 % | CUTANEOUS | Status: AC
Start: 2017-10-05 — End: ?

## 2017-10-05 MED ORDER — PROTAMINE 10 MG/ML IV
10 mg/mL | INTRAVENOUS | Status: AC
Start: 2017-10-05 — End: ?

## 2017-10-05 MED ORDER — PROPOFOL 10 MG/ML IV EMUL
10 mg/mL | INTRAVENOUS | Status: DC | PRN
Start: 2017-10-05 — End: 2017-10-05
  Administered 2017-10-05 (×2): via INTRAVENOUS

## 2017-10-05 MED ORDER — SUCCINYLCHOLINE CHLORIDE 100 MG/5 ML (20 MG/ML) IV SYRINGE
100 mg/5 mL (20 mg/mL) | INTRAVENOUS | Status: AC
Start: 2017-10-05 — End: ?

## 2017-10-05 MED ORDER — PROPOFOL 10 MG/ML IV EMUL
10 mg/mL | INTRAVENOUS | Status: AC
Start: 2017-10-05 — End: ?

## 2017-10-05 MED ORDER — INSULIN LISPRO 100 UNIT/ML INJECTION
100 unit/mL | Freq: Once | SUBCUTANEOUS | Status: AC
Start: 2017-10-05 — End: 2017-10-05
  Administered 2017-10-05: 13:00:00 via SUBCUTANEOUS

## 2017-10-05 MED ORDER — NEOSTIGMINE METHYLSULFATE 3 MG/3 ML (1 MG/ML) IV SYRINGE
3 mg/ mL (1 mg/mL) | INTRAVENOUS | Status: AC
Start: 2017-10-05 — End: ?

## 2017-10-05 MED ORDER — DEXTROSE 50% IN WATER (D50W) IV
INTRAVENOUS | Status: DC | PRN
Start: 2017-10-05 — End: 2017-10-05

## 2017-10-05 MED ORDER — FENTANYL CITRATE (PF) 50 MCG/ML IJ SOLN
50 mcg/mL | INTRAMUSCULAR | Status: AC
Start: 2017-10-05 — End: ?

## 2017-10-05 MED ORDER — HYDROMORPHONE 0.5 MG/0.5 ML SYRINGE
0.5 mg/ mL | INTRAMUSCULAR | Status: DC | PRN
Start: 2017-10-05 — End: 2017-10-05
  Administered 2017-10-05: 16:00:00 via INTRAVENOUS

## 2017-10-05 MED ORDER — KETOROLAC TROMETHAMINE 30 MG/ML INJECTION
30 mg/mL (1 mL) | INTRAMUSCULAR | Status: DC | PRN
Start: 2017-10-05 — End: 2017-10-05
  Administered 2017-10-05: 15:00:00 via INTRAVENOUS

## 2017-10-05 MED ORDER — CLINDAMYCIN IN D5W 600 MG/50 ML IV PIGGY BACK
600 mg/50 mL | INTRAVENOUS | Status: DC | PRN
Start: 2017-10-05 — End: 2017-10-05
  Administered 2017-10-05: 15:00:00 via INTRAVENOUS

## 2017-10-05 MED ORDER — METOCLOPRAMIDE 5 MG/ML IJ SOLN
5 mg/mL | INTRAMUSCULAR | Status: DC | PRN
Start: 2017-10-05 — End: 2017-10-05
  Administered 2017-10-05: 14:00:00 via INTRAVENOUS

## 2017-10-05 MED ORDER — ONDANSETRON (PF) 4 MG/2 ML INJECTION
4 mg/2 mL | INTRAMUSCULAR | Status: AC
Start: 2017-10-05 — End: ?

## 2017-10-05 MED ORDER — LACTATED RINGERS IV
INTRAVENOUS | Status: DC
Start: 2017-10-05 — End: 2017-10-05
  Administered 2017-10-05: 13:00:00 via INTRAVENOUS

## 2017-10-05 MED ORDER — LIDOCAINE (PF) 20 MG/ML (2 %) IJ SOLN
20 mg/mL (2 %) | INTRAMUSCULAR | Status: DC | PRN
Start: 2017-10-05 — End: 2017-10-05
  Administered 2017-10-05: 14:00:00 via INTRAVENOUS

## 2017-10-05 MED ORDER — SUCCINYLCHOLINE CHLORIDE 20 MG/ML INJECTION
20 mg/mL | INTRAMUSCULAR | Status: DC | PRN
Start: 2017-10-05 — End: 2017-10-05
  Administered 2017-10-05: 14:00:00 via INTRAVENOUS

## 2017-10-05 MED ORDER — GLUCAGON 1 MG INJECTION
1 mg | INTRAMUSCULAR | Status: DC | PRN
Start: 2017-10-05 — End: 2017-10-05

## 2017-10-05 MED ORDER — GLUCOSE 4 GRAM CHEWABLE TAB
4 gram | ORAL | Status: DC | PRN
Start: 2017-10-05 — End: 2017-10-05

## 2017-10-05 MED ORDER — OXYCODONE-ACETAMINOPHEN 5 MG-325 MG TAB
5-325 mg | ORAL_TABLET | ORAL | 0 refills | Status: AC | PRN
Start: 2017-10-05 — End: ?

## 2017-10-05 MED ORDER — FAMOTIDINE (PF) 20 MG/2 ML IV
20 mg/2 mL | INTRAVENOUS | Status: DC | PRN
Start: 2017-10-05 — End: 2017-10-05
  Administered 2017-10-05: 14:00:00 via INTRAVENOUS

## 2017-10-05 MED ORDER — OXYCODONE-ACETAMINOPHEN 5 MG-325 MG TAB
5-325 mg | Freq: Once | ORAL | Status: DC
Start: 2017-10-05 — End: 2017-10-05

## 2017-10-05 MED ORDER — SODIUM CHLORIDE 0.9 % IJ SYRG
Freq: Three times a day (TID) | INTRAMUSCULAR | Status: DC
Start: 2017-10-05 — End: 2017-10-05

## 2017-10-05 MED ORDER — ROCURONIUM 10 MG/ML IV
10 mg/mL | INTRAVENOUS | Status: AC
Start: 2017-10-05 — End: ?

## 2017-10-05 MED ORDER — METOCLOPRAMIDE 5 MG/ML IJ SOLN
5 mg/mL | INTRAMUSCULAR | Status: AC
Start: 2017-10-05 — End: ?

## 2017-10-05 MED ORDER — NEOSTIGMINE METHYLSULFATE 1 MG/ML INJECTION
1 mg/mL | INTRAMUSCULAR | Status: DC | PRN
Start: 2017-10-05 — End: 2017-10-05
  Administered 2017-10-05: 15:00:00 via INTRAVENOUS

## 2017-10-05 MED ORDER — LACTATED RINGERS IV
INTRAVENOUS | Status: DC
Start: 2017-10-05 — End: 2017-10-05

## 2017-10-05 MED ORDER — DIPHENHYDRAMINE HCL 50 MG/ML IJ SOLN
50 mg/mL | INTRAMUSCULAR | Status: DC | PRN
Start: 2017-10-05 — End: 2017-10-05

## 2017-10-05 MED FILL — ROCURONIUM 10 MG/ML IV: 10 mg/mL | INTRAVENOUS | Qty: 10

## 2017-10-05 MED FILL — BD POSIFLUSH NORMAL SALINE 0.9 % INJECTION SYRINGE: INTRAMUSCULAR | Qty: 10

## 2017-10-05 MED FILL — DILAUDID (PF) 0.5 MG/0.5 ML INJECTION SYRINGE: 0.5 mg/ mL | INTRAMUSCULAR | Qty: 1

## 2017-10-05 MED FILL — ESMOLOL 10 MG/ML IV SOLN: 100 mg/10 mL (10 mg/mL) | INTRAVENOUS | Qty: 10

## 2017-10-05 MED FILL — SILVER NITRATE APPLICATORS 75 %-25 % TOPICAL STICK: 75-25 % | CUTANEOUS | Qty: 1

## 2017-10-05 MED FILL — CLINDAMYCIN PHOSPHATE 150 MG/ML IJ SOLN: 150 mg/mL | INTRAMUSCULAR | Qty: 4

## 2017-10-05 MED FILL — DIPRIVAN 10 MG/ML INTRAVENOUS EMULSION: 10 mg/mL | INTRAVENOUS | Qty: 50

## 2017-10-05 MED FILL — ONDANSETRON (PF) 4 MG/2 ML INJECTION: 4 mg/2 mL | INTRAMUSCULAR | Qty: 4

## 2017-10-05 MED FILL — INSULIN LISPRO 100 UNIT/ML INJECTION: 100 unit/mL | SUBCUTANEOUS | Qty: 1

## 2017-10-05 MED FILL — FAMOTIDINE (PF) 20 MG/2 ML IV: 20 mg/2 mL | INTRAVENOUS | Qty: 2

## 2017-10-05 MED FILL — GLYCOPYRROLATE 0.2 MG/ML IJ SOLN: 0.2 mg/mL | INTRAMUSCULAR | Qty: 2

## 2017-10-05 MED FILL — PROTAMINE 10 MG/ML IV: 10 mg/mL | INTRAVENOUS | Qty: 5

## 2017-10-05 MED FILL — SUCCINYLCHOLINE CHLORIDE 100 MG/5 ML (20 MG/ML) IV SYRINGE: 100 mg/5 mL (20 mg/mL) | INTRAVENOUS | Qty: 5

## 2017-10-05 MED FILL — NEOSTIGMINE METHYLSULFATE 3 MG/3 ML (1 MG/ML) IV SYRINGE: 3 mg/ mL (1 mg/mL) | INTRAVENOUS | Qty: 3

## 2017-10-05 MED FILL — LACTATED RINGERS IV: INTRAVENOUS | Qty: 1000

## 2017-10-05 MED FILL — METOCLOPRAMIDE 5 MG/ML IJ SOLN: 5 mg/mL | INTRAMUSCULAR | Qty: 2

## 2017-10-05 MED FILL — FENTANYL CITRATE (PF) 50 MCG/ML IJ SOLN: 50 mcg/mL | INTRAMUSCULAR | Qty: 5

## 2017-10-05 MED FILL — LIDOCAINE (PF) 20 MG/ML (2 %) IV SYRINGE: 100 mg/5 mL (2 %) | INTRAVENOUS | Qty: 15

## 2017-10-05 MED FILL — DIPRIVAN 10 MG/ML INTRAVENOUS EMULSION: 10 mg/mL | INTRAVENOUS | Qty: 100

## 2017-10-05 MED FILL — MIDAZOLAM 1 MG/ML IJ SOLN: 1 mg/mL | INTRAMUSCULAR | Qty: 2

## 2017-10-05 MED FILL — DEXAMETHASONE SODIUM PHOSPHATE 4 MG/ML IJ SOLN: 4 mg/mL | INTRAMUSCULAR | Qty: 1

## 2017-10-05 NOTE — Anesthesia Pre-Procedure Evaluation (Signed)
Anesthetic History   No history of anesthetic complications            Review of Systems / Medical History  Patient summary reviewed and pertinent labs reviewed    Pulmonary        Sleep apnea: No treatment        Comments: Seasonal allergies   Neuro/Psych   Within defined limits           Cardiovascular    Hypertension              Exercise tolerance: >4 METS     GI/Hepatic/Renal     GERD (food related): well controlled    Renal disease: CRI       Endo/Other    Diabetes: well controlled, type 2, using insulin  Hypothyroidism: well controlled  Arthritis and anemia     Other Findings   Comments: Diabetic neuropathy           Physical Exam    Airway  Mallampati: III  TM Distance: 4 - 6 cm  Neck ROM: normal range of motion   Mouth opening: Normal     Cardiovascular    Rhythm: regular  Rate: normal         Dental    Dentition: Upper dentition intact and Lower dentition intact     Pulmonary  Breath sounds clear to auscultation               Abdominal         Other Findings            Anesthetic Plan    ASA: 3  Anesthesia type: general          Induction: Intravenous  Anesthetic plan and risks discussed with: Patient

## 2017-10-05 NOTE — Brief Op Note (Signed)
BRIEF OPERATIVE NOTE    Date of Procedure: 10/05/2017   Preoperative Diagnosis: SUBMUCOSAL FIBROID, POST MENOPAUSAL BLEEDING, PROLAPSING FIBROID  Postoperative Diagnosis: SUBMUCOSAL FIBROID, POST MENOPAUSAL BLEEDING,     Procedure(s):  HYSTEROSCOPY, DILITATION AND CURETTAGE, MYOMECTOMY, polypectomy  Surgeon(s) and Role:     * Erasmo Downer, MD - Primary         Surgical Assistant: Dyann Ruddle    Surgical Staff:  Circ-1: Shaune Spittle, RN  Circ-2: Dora Sims, RN  Scrub RN-1: Annalee Genta, RN  Surg Asst-1: Berline Lopes Mlot, RN  Event Time In   Incision Start 1016   Incision Close 1122     Anesthesia: General   Estimated Blood Loss: 25cc  Specimens:   ID Type Source Tests Collected by Time Destination   1 : polyp, fibroma Preservative Uterus  Erasmo Downer, MD 10/05/2017 1115 Pathology      Findings: multiple fibroids, endometrial polyps   Complications: none  Implants: * No implants in log *

## 2017-10-05 NOTE — Other (Signed)
Blood sugar 283.  Dr Toribio Harbour ordered 7 units of insulin.

## 2017-10-05 NOTE — Op Note (Signed)
[facility]  OPERATIVE REPORT    Name:Evelyn Jones, Evelyn Jones  MR#: [id number]  DOB: 07/06/1956  ACCOUNT #: [id number]   DATE OF SERVICE: 10/05/2017    PREOPERATIVE DIAGNOSIS:  Postmenopausal bleeding, prolapsing submucosal fibroid.    POSTOPERATIVE DIAGNOSIS:  Postmenopausal bleeding, prolapsing submucosal fibroid and endometrial polyps.    PROCEDURES PERFORMED:  Hysteroscopy, myomectomy, D and C.  TruClear device was used.    SURGEON:  P. Mckenzye Cutright     ANESTHESIA:  General, Dr. Toribio Harbour is the MD and Mr. Caroline More is the CRNA.       ASSISTANT:  Lenon Oms     TECH:  Coralyn Mark.    Patient was advised of risks, benefits and alternatives of the procedure.  Risks including those of bleeding, infection, injury to surrounding structures.  All questions were answered and the procedure was done.  She was placed in lithotomy position after anesthetic was administered.  She was prepped and draped.  Findings were as follows:  She was morbidly obese and it was difficult to do the procedure and several assistants were scrubbed in to help with retracting vaginal walls.  The labia were gently taped to the drapes as they were over shadowing the entrance and making surgery suboptimal, so they were taped away from the surgical area with Allis forceps.  Self-retaining speculum was placed in the vaginal wall.  Anterior and 2 sidewall retractors were used because of the limited visualization.  These measures helped improve visualization significantly.  She had a large submucosal fibroid prolapsing out of the cervix.  The cervix was high above the level of the fibroid, which was about 4-5 cm in dimension.  The fibroid was then grasped with Kocher clamp and at first I transfixed the base of it, which was high up into the uterine cavity, but I went up just at the level of the endocervix where I could reach, tied off with an 0 Vicryl and then resected most of it and then I held the  stump with Kocher clamps again and then placed a figure-of-eight suture and was able to push it up with a laparoscopic knot pusher away from the endocervical level and tied it off and then some of the stalk was resected below the tie and hemostasis was achieved.  The cervical os was extremely patulous and now the cervix was seen and appeared to be normal.  The lateral part of the cervical tissue was held with a long Allis forceps and then the cervical os was already open to 6-8 Hegar dilator without any dilatation.  The uterine length measured at 7 cm.  The hysteroscope was introduced into the uterine cavity.  Visualization was excellent.  Normal saline with distention, medium use.  350 mL deficit was noted at the end of the procedure.  She had multiple endometrial polyps coming from the right uterine wall and also submucosal fibroid.  The stalk of which went high up into the right fundal area and the suture was helping with the hemostasis.  The TruClear device was introduced into the uterine cavity and the polyp was then resected with that and some of the myoma was also resected with that under direct visualization.  The instruments were then removed.  Cavity was curetted and endometrial curettings were sent to Pathology along with the polyp and the TruClear excision as well.      SPECIMENS REMOVED:  Fibroid and curettings    ESTIMATED BLOOD LOSS:  About 25 mL.  COMPLICATIONS:  There were no complications.      IMPLANTS:  none    Sponge count and instrument counts were correct.  The patient was taken to recovery room in stable condition at the end of the operative procedure.  She did receive 1 dose of antibiotic at the beginning of the surgery and timeout was done prior to the start of the surgery as well.      Erasmo Downer, MD       PS / MN  D: 10/05/2017 11:31     T: 10/05/2017 15:33  JOB #: [id number]

## 2017-10-05 NOTE — Other (Signed)
Fluid deficit 350

## 2017-10-05 NOTE — Anesthesia Post-Procedure Evaluation (Signed)
Post-Anesthesia Evaluation and Assessment    Patient: Asako Saliba MRN: [id number]  SSN: [id number]    Date of Birth: [birth year]  Age: 61 y.o.  Sex: female       Cardiovascular Function/Vital Signs  Visit Vitals   ??? BP 123/59   ??? Pulse 82   ??? Temp 37.1 ??C (98.8 ??F)   ??? Resp 17   ??? SpO2 93%       Patient is status post general anesthesia for Procedure(s):  HYSTEROSCOPY, DILITATION AND CURETTAGE, MYOMECTOMY, polypectomy.    Nausea/Vomiting: None    Postoperative hydration reviewed and adequate.    Pain:  Pain Scale 1: Numeric (0 - 10) (10/05/17 1158)  Pain Intensity 1: 4 (10/05/17 1158)   Managed    Neurological Status:   Neuro (WDL): Within Defined Limits (10/05/17 1135)   At baseline    Mental Status and Level of Consciousness: Arousable    Pulmonary Status:   O2 Device: Nasal cannula (10/05/17 1137)   Adequate oxygenation and airway patent    Complications related to anesthesia: None    Post-anesthesia assessment completed. No concerns    Signed By: Danella Sensing, MD     October 05, 2017

## 2017-10-05 NOTE — Other (Signed)
Pt fall protocol  Yellow arm band on patient, yellow non skid socks on   bed in low position, all side rails up, call bell in reach  Pt  & family instructed in "call don't fall" protocol     Use your call bell,and wait for assistance, staff not family will assist you to get up &   move about  Pt & family verbalize understanding of fall precautions and "call don't fall" protocol

## 2017-10-05 NOTE — Op Note (Signed)
[place]  OPERATIVE REPORT    Name:Sedeno, Yasmene  MR#: [id number]  DOB: 12-Aug-1956  ACCOUNT #: [id number]   DATE OF SERVICE: 10/05/2017    PREOPERATIVE DIAGNOSIS:  Postmenopausal bleeding, prolapsing submucosal fibroid.    POSTOPERATIVE DIAGNOSIS:  Postmenopausal bleeding, prolapsing submucosal fibroid and endometrial polyps.    PROCEDURES PERFORMED:  Hysteroscopy, myomectomy, D and C.  TruClear device was used.    SURGEON:  P. Tage Feggins     ANESTHESIA:  General, Dr. Toribio Harbour is the MD and Mr. Caroline More is the CRNA.       ASSISTANT:  Lenon Oms     TECH:  Coralyn Mark.    Patient was advised of risks, benefits and alternatives of the procedure.  Risks including those of bleeding, infection, injury to surrounding structures.  All questions were answered and the procedure was done.  She was placed in lithotomy position after anesthetic was administered.  She was prepped and draped.  Findings were as follows:  She was morbidly obese and it was difficult to do the procedure and several assistants were scrubbed in to help with retracting vaginal walls.  The labia were gently taped to the drapes as they were over shadowing the entrance and making surgery suboptimal, so they were taped away from the surgical area with Allis forceps.  Self-retaining speculum was placed in the vaginal wall.  Anterior and 2 sidewall retractors were used because of the limited visualization.  These measures helped improve visualization significantly.  She had a large submucosal fibroid prolapsing out of the cervix.  The cervix was high above the level of the fibroid, which was about 4-5 cm in dimension.  The fibroid was then grasped with Kocher clamp and at first I transfixed the base of it, which was high up into the uterine cavity, but I went up just at the level of the endocervix where I could reach, tied off with an 0 Vicryl and then resected most of it and then I held the stump with Kocher clamps again and then  placed a figure-of-eight suture and was able to push it up with a laparoscopic knot pusher away from the endocervical level and tied it off and then some of the stalk was resected below the tie and hemostasis was achieved.  The cervical os was extremely patulous and now the cervix was seen and appeared to be normal.  The lateral part of the cervical tissue was held with a long Allis forceps and then the cervical os was already open to 6-8 Hegar dilator without any dilatation.  The uterine length measured at 7 cm.  The hysteroscope was introduced into the uterine cavity.  Visualization was excellent.  Normal saline with distention, medium use.  350 mL deficit was noted at the end of the procedure.  She had multiple endometrial polyps coming from the right uterine wall and also submucosal fibroid.  The stalk of which went high up into the right fundal area and the suture was helping with the hemostasis.  The TruClear device was introduced into the uterine cavity and the polyp was then resected with that and some of the myoma was also resected with that under direct visualization.  The instruments were then removed.  Cavity was curetted and endometrial curettings were sent to Pathology along with the polyp and the TruClear excision as well.      SPECIMENS REMOVED:  Fibroid and curettings    ESTIMATED BLOOD LOSS:  About 25 mL.  COMPLICATIONS:  There were no complications.      IMPLANTS:  none    Sponge count and instrument counts were correct.  The patient was taken to recovery room in stable condition at the end of the operative procedure.  She did receive 1 dose of antibiotic at the beginning of the surgery and timeout was done prior to the start of the surgery as well.      Erasmo Downer, MD       PS / MN  D: 10/05/2017 11:31     T: 10/05/2017 15:33  JOB #: [id number]

## 2017-10-07 MED FILL — ONDANSETRON (PF) 4 MG/2 ML INJECTION: 4 mg/2 mL | INTRAMUSCULAR | Qty: 4

## 2017-10-07 MED FILL — GLYCOPYRROLATE 0.2 MG/ML IJ SOLN: 0.2 mg/mL | INTRAMUSCULAR | Qty: 0.5

## 2017-10-07 MED FILL — CLEOCIN 600 MG/50 ML IN 5 % DEXTROSE INTRAVENOUS PIGGYBACK: 600 mg/50 mL | INTRAVENOUS | Qty: 600

## 2017-10-07 MED FILL — FAMOTIDINE (PF) 20 MG/2 ML IV: 20 mg/2 mL | INTRAVENOUS | Qty: 20

## 2017-10-07 MED FILL — DIPRIVAN 10 MG/ML INTRAVENOUS EMULSION: 10 mg/mL | INTRAVENOUS | Qty: 220

## 2017-10-07 MED FILL — METOCLOPRAMIDE 5 MG/ML IJ SOLN: 5 mg/mL | INTRAMUSCULAR | Qty: 10

## 2017-10-07 MED FILL — KETOROLAC TROMETHAMINE 30 MG/ML INJECTION: 30 mg/mL (1 mL) | INTRAMUSCULAR | Qty: 30

## 2017-10-07 MED FILL — ROCURONIUM 10 MG/ML IV: 10 mg/mL | INTRAVENOUS | Qty: 30

## 2017-10-07 MED FILL — LIDOCAINE (PF) 20 MG/ML (2 %) IJ SOLN: 20 mg/mL (2 %) | INTRAMUSCULAR | Qty: 50

## 2017-10-07 MED FILL — NEOSTIGMINE METHYLSULFATE 1 MG/ML INJECTION: 1 mg/mL | INTRAMUSCULAR | Qty: 3

## 2017-10-07 MED FILL — ANECTINE 20 MG/ML INJECTION SOLUTION: 20 mg/mL | INTRAMUSCULAR | Qty: 100

## 2017-11-27 DIAGNOSIS — G4733 Obstructive sleep apnea (adult) (pediatric): Secondary | ICD-10-CM

## 2018-11-29 DIAGNOSIS — I10 Essential (primary) hypertension: Secondary | ICD-10-CM | POA: Insufficient documentation

## 2022-01-17 ENCOUNTER — Inpatient Hospital Stay
Admission: EM | Admit: 2022-01-17 | Discharge: 2022-02-22 | DRG: 091 | Disposition: E | Payer: BLUE CROSS/BLUE SHIELD | Attending: Internal Medicine | Admitting: Internal Medicine

## 2022-01-17 ENCOUNTER — Emergency Department: Payer: BLUE CROSS/BLUE SHIELD

## 2022-01-17 ENCOUNTER — Other Ambulatory Visit: Payer: Self-pay

## 2022-01-17 DIAGNOSIS — Z882 Allergy status to sulfonamides status: Secondary | ICD-10-CM

## 2022-01-17 DIAGNOSIS — E669 Obesity, unspecified: Secondary | ICD-10-CM | POA: Diagnosis present

## 2022-01-17 DIAGNOSIS — G9341 Metabolic encephalopathy: Secondary | ICD-10-CM

## 2022-01-17 DIAGNOSIS — Z881 Allergy status to other antibiotic agents status: Secondary | ICD-10-CM

## 2022-01-17 DIAGNOSIS — Z978 Presence of other specified devices: Secondary | ICD-10-CM

## 2022-01-17 DIAGNOSIS — Z992 Dependence on renal dialysis: Secondary | ICD-10-CM | POA: Diagnosis not present

## 2022-01-17 DIAGNOSIS — J9602 Acute respiratory failure with hypercapnia: Secondary | ICD-10-CM | POA: Diagnosis not present

## 2022-01-17 DIAGNOSIS — C541 Malignant neoplasm of endometrium: Secondary | ICD-10-CM

## 2022-01-17 DIAGNOSIS — Z794 Long term (current) use of insulin: Secondary | ICD-10-CM

## 2022-01-17 DIAGNOSIS — E875 Hyperkalemia: Secondary | ICD-10-CM | POA: Diagnosis present

## 2022-01-17 DIAGNOSIS — I6782 Cerebral ischemia: Secondary | ICD-10-CM | POA: Diagnosis not present

## 2022-01-17 DIAGNOSIS — E039 Hypothyroidism, unspecified: Secondary | ICD-10-CM | POA: Diagnosis present

## 2022-01-17 DIAGNOSIS — N2581 Secondary hyperparathyroidism of renal origin: Secondary | ICD-10-CM | POA: Diagnosis present

## 2022-01-17 DIAGNOSIS — J9601 Acute respiratory failure with hypoxia: Secondary | ICD-10-CM | POA: Diagnosis not present

## 2022-01-17 DIAGNOSIS — I120 Hypertensive chronic kidney disease with stage 5 chronic kidney disease or end stage renal disease: Secondary | ICD-10-CM | POA: Diagnosis present

## 2022-01-17 DIAGNOSIS — Z8616 Personal history of COVID-19: Secondary | ICD-10-CM | POA: Diagnosis not present

## 2022-01-17 DIAGNOSIS — Z7189 Other specified counseling: Secondary | ICD-10-CM | POA: Diagnosis not present

## 2022-01-17 DIAGNOSIS — I674 Hypertensive encephalopathy: Secondary | ICD-10-CM | POA: Diagnosis present

## 2022-01-17 DIAGNOSIS — Z20822 Contact with and (suspected) exposure to covid-19: Secondary | ICD-10-CM | POA: Diagnosis present

## 2022-01-17 DIAGNOSIS — Z046 Encounter for general psychiatric examination, requested by authority: Secondary | ICD-10-CM

## 2022-01-17 DIAGNOSIS — Z888 Allergy status to other drugs, medicaments and biological substances status: Secondary | ICD-10-CM

## 2022-01-17 DIAGNOSIS — N186 End stage renal disease: Secondary | ICD-10-CM | POA: Diagnosis present

## 2022-01-17 DIAGNOSIS — Z66 Do not resuscitate: Secondary | ICD-10-CM | POA: Diagnosis not present

## 2022-01-17 DIAGNOSIS — Z9115 Patient's noncompliance with renal dialysis: Secondary | ICD-10-CM

## 2022-01-17 DIAGNOSIS — I160 Hypertensive urgency: Secondary | ICD-10-CM | POA: Diagnosis present

## 2022-01-17 DIAGNOSIS — G928 Other toxic encephalopathy: Principal | ICD-10-CM | POA: Diagnosis present

## 2022-01-17 DIAGNOSIS — R001 Bradycardia, unspecified: Secondary | ICD-10-CM | POA: Diagnosis not present

## 2022-01-17 DIAGNOSIS — I639 Cerebral infarction, unspecified: Secondary | ICD-10-CM | POA: Diagnosis not present

## 2022-01-17 DIAGNOSIS — E1122 Type 2 diabetes mellitus with diabetic chronic kidney disease: Secondary | ICD-10-CM | POA: Diagnosis present

## 2022-01-17 DIAGNOSIS — E872 Acidosis, unspecified: Secondary | ICD-10-CM | POA: Diagnosis present

## 2022-01-17 DIAGNOSIS — E8770 Fluid overload, unspecified: Secondary | ICD-10-CM

## 2022-01-17 DIAGNOSIS — E1129 Type 2 diabetes mellitus with other diabetic kidney complication: Secondary | ICD-10-CM

## 2022-01-17 DIAGNOSIS — E1142 Type 2 diabetes mellitus with diabetic polyneuropathy: Secondary | ICD-10-CM | POA: Diagnosis present

## 2022-01-17 DIAGNOSIS — D631 Anemia in chronic kidney disease: Secondary | ICD-10-CM | POA: Diagnosis present

## 2022-01-17 DIAGNOSIS — Z9911 Dependence on respirator [ventilator] status: Secondary | ICD-10-CM

## 2022-01-17 DIAGNOSIS — G4733 Obstructive sleep apnea (adult) (pediatric): Secondary | ICD-10-CM | POA: Diagnosis present

## 2022-01-17 DIAGNOSIS — R778 Other specified abnormalities of plasma proteins: Secondary | ICD-10-CM | POA: Diagnosis present

## 2022-01-17 DIAGNOSIS — Z6839 Body mass index (BMI) 39.0-39.9, adult: Secondary | ICD-10-CM

## 2022-01-17 DIAGNOSIS — I2609 Other pulmonary embolism with acute cor pulmonale: Secondary | ICD-10-CM

## 2022-01-17 DIAGNOSIS — R0902 Hypoxemia: Secondary | ICD-10-CM

## 2022-01-17 DIAGNOSIS — E785 Hyperlipidemia, unspecified: Secondary | ICD-10-CM | POA: Diagnosis present

## 2022-01-17 DIAGNOSIS — D472 Monoclonal gammopathy: Secondary | ICD-10-CM | POA: Diagnosis present

## 2022-01-17 DIAGNOSIS — Z515 Encounter for palliative care: Secondary | ICD-10-CM | POA: Diagnosis not present

## 2022-01-17 DIAGNOSIS — Z79890 Hormone replacement therapy (postmenopausal): Secondary | ICD-10-CM

## 2022-01-17 DIAGNOSIS — J9600 Acute respiratory failure, unspecified whether with hypoxia or hypercapnia: Secondary | ICD-10-CM

## 2022-01-17 DIAGNOSIS — A419 Sepsis, unspecified organism: Secondary | ICD-10-CM | POA: Diagnosis not present

## 2022-01-17 DIAGNOSIS — E1165 Type 2 diabetes mellitus with hyperglycemia: Secondary | ICD-10-CM | POA: Diagnosis present

## 2022-01-17 DIAGNOSIS — R6521 Severe sepsis with septic shock: Secondary | ICD-10-CM | POA: Diagnosis not present

## 2022-01-17 DIAGNOSIS — Z7985 Long-term (current) use of injectable non-insulin antidiabetic drugs: Secondary | ICD-10-CM

## 2022-01-17 DIAGNOSIS — Z79899 Other long term (current) drug therapy: Secondary | ICD-10-CM

## 2022-01-17 DIAGNOSIS — J15211 Pneumonia due to Methicillin susceptible Staphylococcus aureus: Secondary | ICD-10-CM | POA: Diagnosis not present

## 2022-01-17 DIAGNOSIS — Z8542 Personal history of malignant neoplasm of other parts of uterus: Secondary | ICD-10-CM

## 2022-01-17 DIAGNOSIS — N185 Chronic kidney disease, stage 5: Secondary | ICD-10-CM

## 2022-01-17 DIAGNOSIS — G939 Disorder of brain, unspecified: Secondary | ICD-10-CM

## 2022-01-17 DIAGNOSIS — E035 Myxedema coma: Secondary | ICD-10-CM | POA: Diagnosis present

## 2022-01-17 DIAGNOSIS — Z923 Personal history of irradiation: Secondary | ICD-10-CM

## 2022-01-17 DIAGNOSIS — Z4659 Encounter for fitting and adjustment of other gastrointestinal appliance and device: Secondary | ICD-10-CM

## 2022-01-17 DIAGNOSIS — Z885 Allergy status to narcotic agent status: Secondary | ICD-10-CM

## 2022-01-17 DIAGNOSIS — Z9114 Patient's other noncompliance with medication regimen: Secondary | ICD-10-CM

## 2022-01-17 HISTORY — DX: Disorder of kidney and ureter, unspecified: N28.9

## 2022-01-17 HISTORY — DX: Sleep apnea, unspecified: G47.30

## 2022-01-17 HISTORY — DX: Type 2 diabetes mellitus without complications: E11.9

## 2022-01-17 HISTORY — DX: Hypothyroidism, unspecified: E03.9

## 2022-01-17 HISTORY — DX: Anemia in other chronic diseases classified elsewhere: D63.8

## 2022-01-17 HISTORY — DX: Essential (primary) hypertension: I10

## 2022-01-17 HISTORY — DX: Malignant (primary) neoplasm, unspecified: C80.1

## 2022-01-17 LAB — CBG MONITORING, ED: Glucose-Capillary: 215 mg/dL — ABNORMAL HIGH (ref 70–99)

## 2022-01-17 LAB — COMPREHENSIVE METABOLIC PANEL
ALT: 22 U/L (ref 0–44)
AST: 39 U/L (ref 15–41)
Albumin: 3.5 g/dL (ref 3.5–5.0)
Alkaline Phosphatase: 132 U/L — ABNORMAL HIGH (ref 38–126)
Anion gap: 13 (ref 5–15)
BUN: 49 mg/dL — ABNORMAL HIGH (ref 8–23)
CO2: 16 mmol/L — ABNORMAL LOW (ref 22–32)
Calcium: 9.7 mg/dL (ref 8.9–10.3)
Chloride: 105 mmol/L (ref 98–111)
Creatinine, Ser: 5.9 mg/dL — ABNORMAL HIGH (ref 0.44–1.00)
GFR, Estimated: 8 mL/min — ABNORMAL LOW (ref >60)
Glucose, Bld: 211 mg/dL — ABNORMAL HIGH (ref 70–99)
Potassium: 4.4 mmol/L (ref 3.5–5.1)
Sodium: 134 mmol/L — ABNORMAL LOW (ref 135–145)
Total Bilirubin: 0.8 mg/dL (ref 0.3–1.2)
Total Protein: 7.6 g/dL (ref 6.5–8.1)

## 2022-01-17 LAB — CBC WITH DIFFERENTIAL/PLATELET
Abs Immature Granulocytes: 0.04 10*3/uL (ref 0.00–0.07)
Basophils Absolute: 0.1 10*3/uL (ref 0.0–0.1)
Basophils Relative: 0 %
Eosinophils Absolute: 0.3 10*3/uL (ref 0.0–0.5)
Eosinophils Relative: 3 %
HCT: 33.7 % — ABNORMAL LOW (ref 36.0–46.0)
Hemoglobin: 10.9 g/dL — ABNORMAL LOW (ref 12.0–15.0)
Immature Granulocytes: 0 %
Lymphocytes Relative: 10 %
Lymphs Abs: 1.1 10*3/uL (ref 0.7–4.0)
MCH: 29.8 pg (ref 26.0–34.0)
MCHC: 32.3 g/dL (ref 30.0–36.0)
MCV: 92.1 fL (ref 80.0–100.0)
Monocytes Absolute: 0.8 10*3/uL (ref 0.1–1.0)
Monocytes Relative: 7 %
Neutro Abs: 8.9 10*3/uL — ABNORMAL HIGH (ref 1.7–7.7)
Neutrophils Relative %: 80 %
Platelets: 281 10*3/uL (ref 150–400)
RBC: 3.66 MIL/uL — ABNORMAL LOW (ref 3.87–5.11)
RDW: 13.6 % (ref 11.5–15.5)
WBC: 11.3 10*3/uL — ABNORMAL HIGH (ref 4.0–10.5)
nRBC: 0 % (ref 0.0–0.2)

## 2022-01-17 LAB — BLOOD GAS, VENOUS
Acid-base deficit: 9.3 mmol/L — ABNORMAL HIGH (ref 0.0–2.0)
Bicarbonate: 16.2 mmol/L — ABNORMAL LOW (ref 20.0–28.0)
O2 Saturation: 83.8 %
Patient temperature: 37
pCO2, Ven: 33 mmHg — ABNORMAL LOW (ref 44.0–60.0)
pH, Ven: 7.3 (ref 7.250–7.430)
pO2, Ven: 54 mmHg — ABNORMAL HIGH (ref 32.0–45.0)

## 2022-01-17 LAB — RESP PANEL BY RT-PCR (FLU A&B, COVID) ARPGX2
Influenza A by PCR: NEGATIVE
Influenza B by PCR: NEGATIVE
SARS Coronavirus 2 by RT PCR: NEGATIVE

## 2022-01-17 LAB — TROPONIN I (HIGH SENSITIVITY)
Troponin I (High Sensitivity): 42 ng/L — ABNORMAL HIGH (ref <18)
Troponin I (High Sensitivity): 43 ng/L — ABNORMAL HIGH (ref <18)

## 2022-01-17 LAB — BRAIN NATRIURETIC PEPTIDE: B Natriuretic Peptide: 329.4 pg/mL — ABNORMAL HIGH (ref 0.0–100.0)

## 2022-01-17 IMAGING — CT CT HEAD W/O CM
4 series · 16 of 47 positions shown, 18 images · non-contrast
Comparison: None.

CLINICAL DATA: Mental status change, unknown cause



[Series 2: head bone · axial · 0.42mm/px · z∈[-113,-81]mm · 3 of 81 slices shown]
[im 9/81  bone]
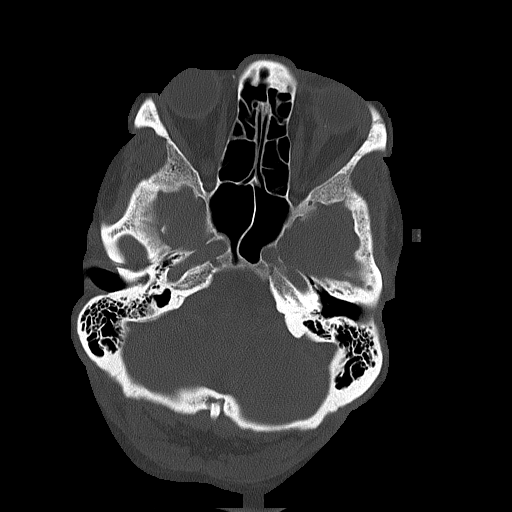
[im 17/81  bone]
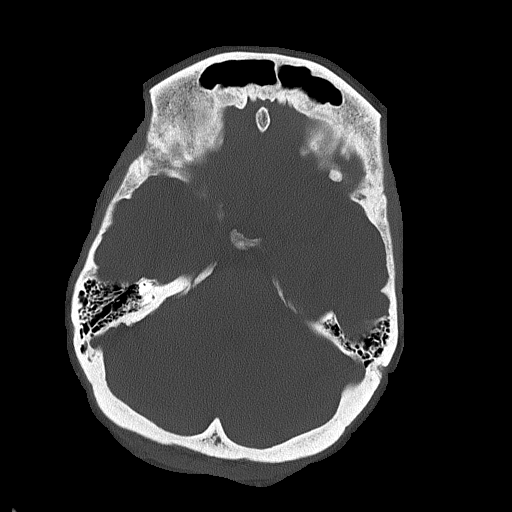
[im 25/81  bone]
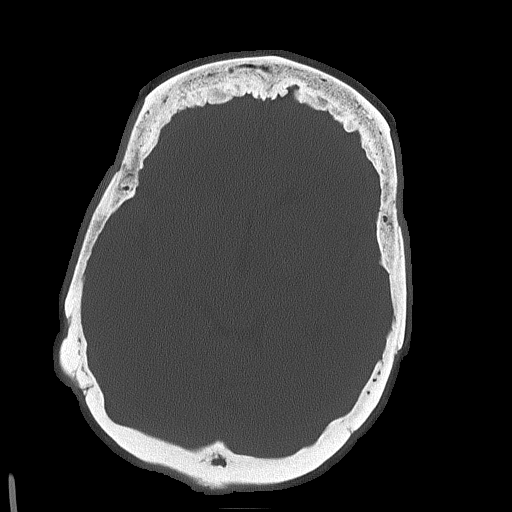

[Series 3: head wo · axial · 0.42mm/px · z∈[-109,+11]mm · 7 of 33 slices shown, 9 images]
[im 5/33  brain]
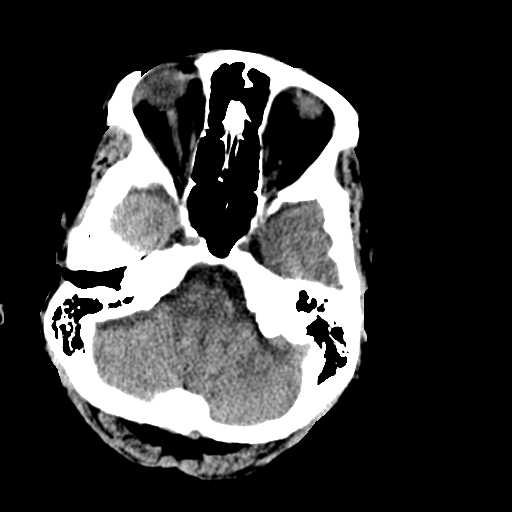
[im 5/33  bone]
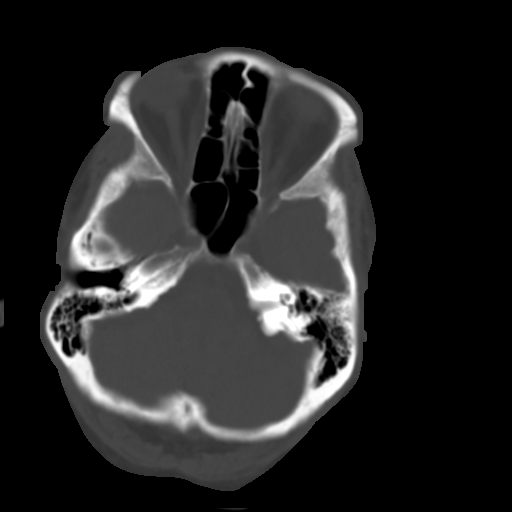
[im 9/33  brain]
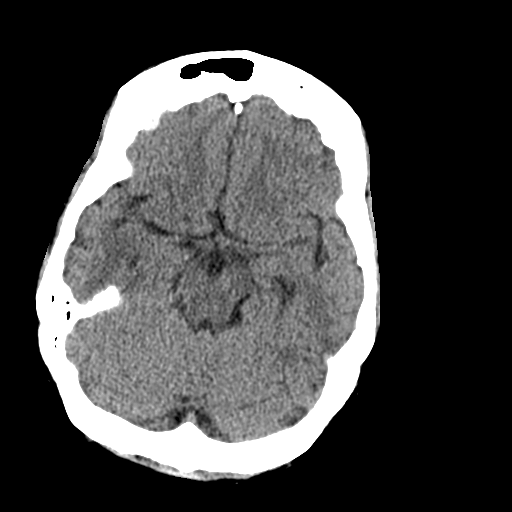
[im 13/33  brain]
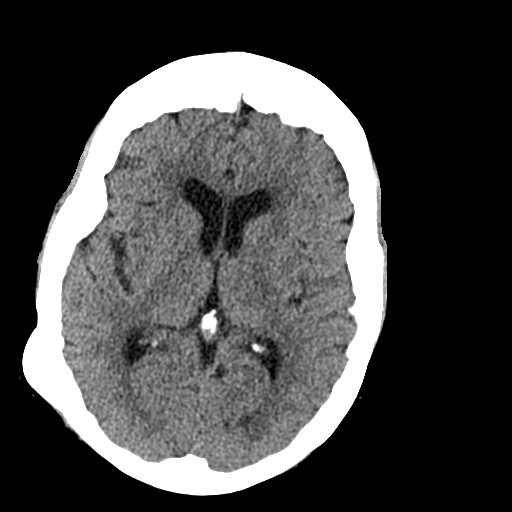
[im 17/33  brain]
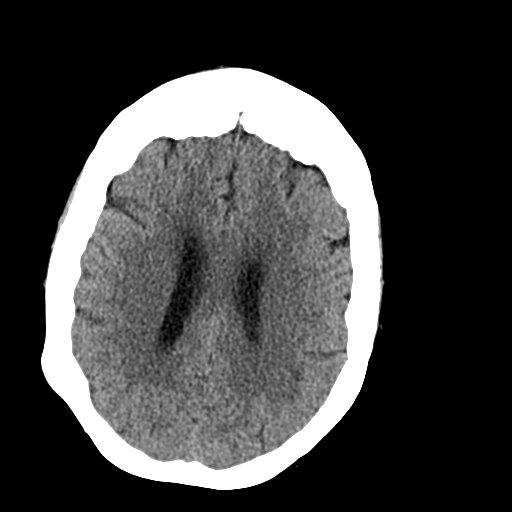
[im 21/33  brain]
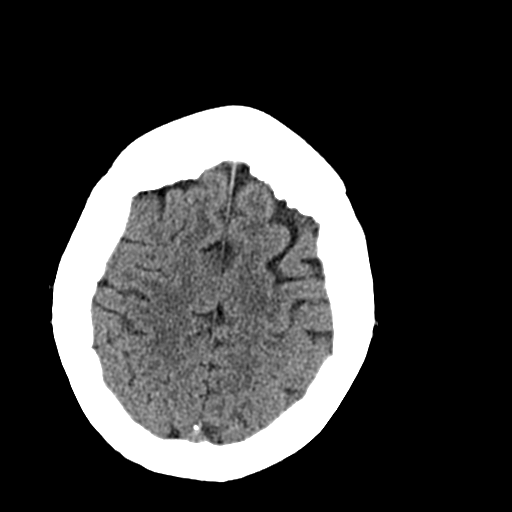
[im 21/33  bone]
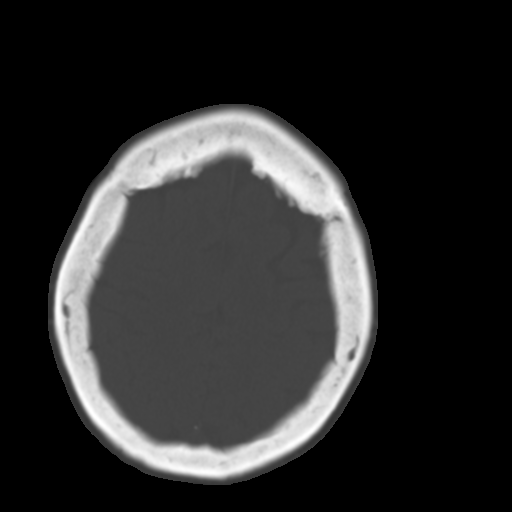
[im 25/33  brain]
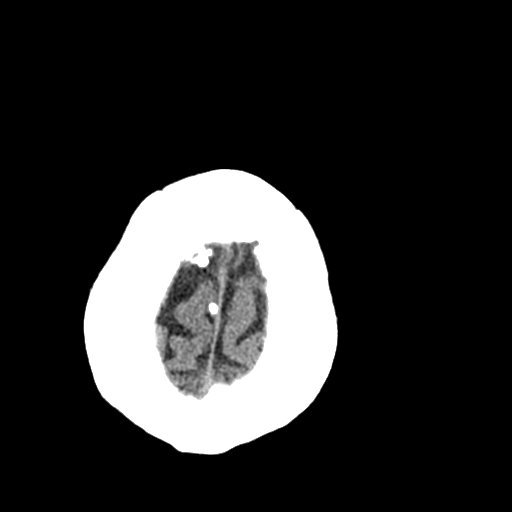
[im 29/33  brain]
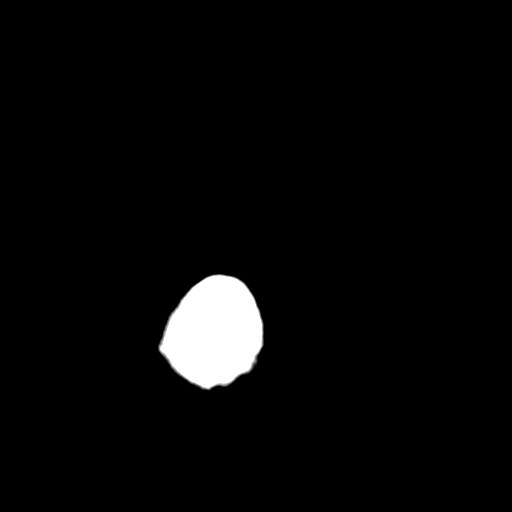

[Series 4: coronal soft tissue · coronal · 0.32mm/px · 3 of 65 slices shown]
[im 22/65  brain]
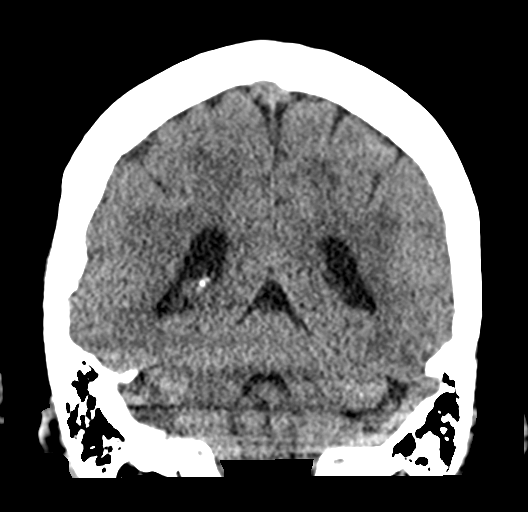
[im 29/65  brain]
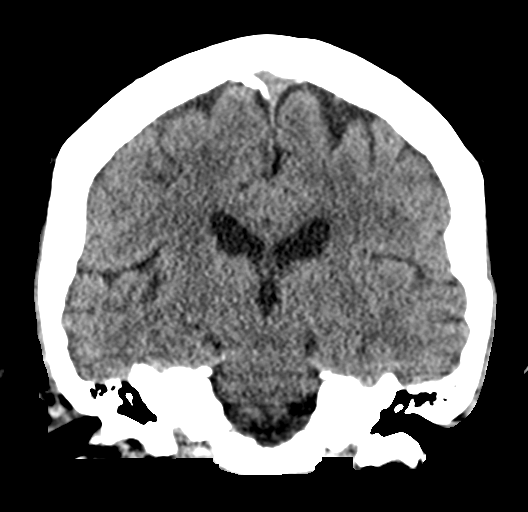
[im 36/65  brain]
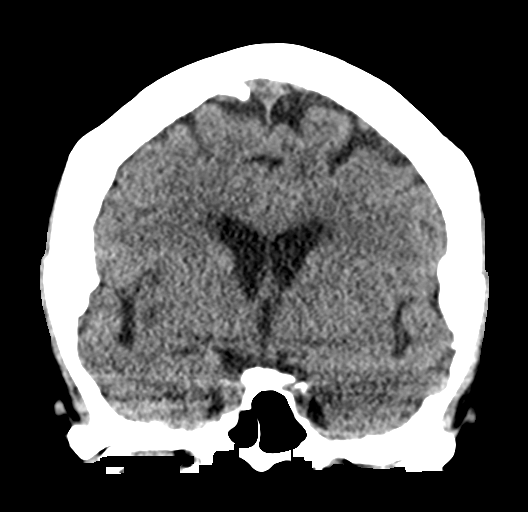

[Series 5: sagittal soft tissue · sagittal · 0.29mm/px · 3 of 56 slices shown]
[im 19/56  brain]
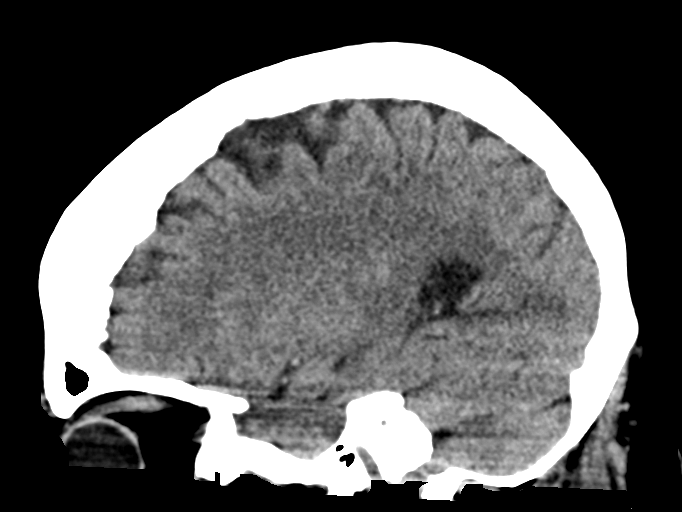
[im 28/56  brain]
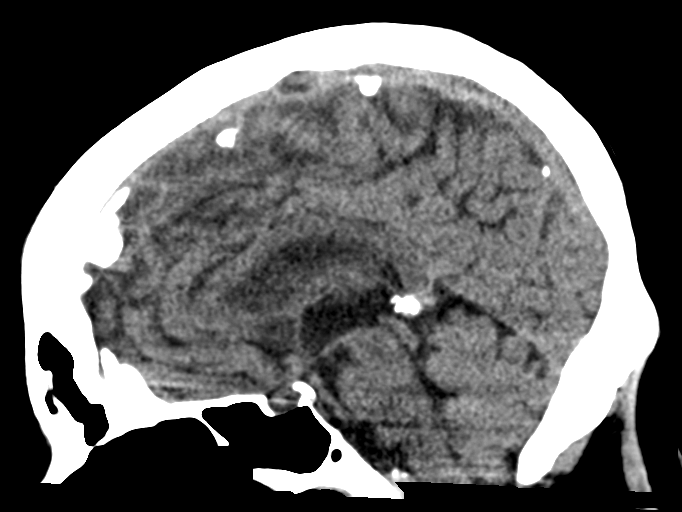
[im 37/56  brain]
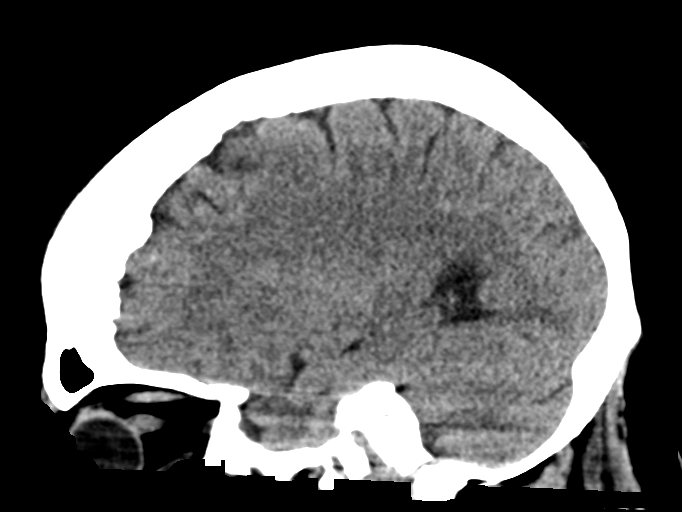

[16 of 47 positions shown; findings below may reference images not displayed]

FINDINGS: Brain: No intracranial hemorrhage, mass effect, or midline shift. No
hydrocephalus. The basilar cisterns are patent. Mild periventricular
and deep white matter hypodensity typical of chronic small vessel
ischemia. No evidence of territorial infarct or acute ischemia. No
extra-axial or intracranial fluid collection.

Vascular: Atherosclerosis of skullbase vasculature without
hyperdense vessel or abnormal calcification.

Skull: Sclerotic cortical thickening of the outer table of the right
occipital bone, nonaggressive in appearance. Frontal hyperostosis.
No fracture or suspicious bone lesion.

Sinuses/Orbits: Paranasal sinuses and mastoid air cells are clear.
The visualized orbits are unremarkable.

Other: None.
IMPRESSION: 1. No acute intracranial abnormality.
2. Mild chronic small vessel ischemia.

## 2022-01-17 IMAGING — DX DG CHEST 1V PORT
1 series · 1 of 1 positions shown · non-contrast
Comparison: None.

CLINICAL DATA: Missed dialysis

EXAM:
PORTABLE CHEST 1 VIEW

[chest ap]
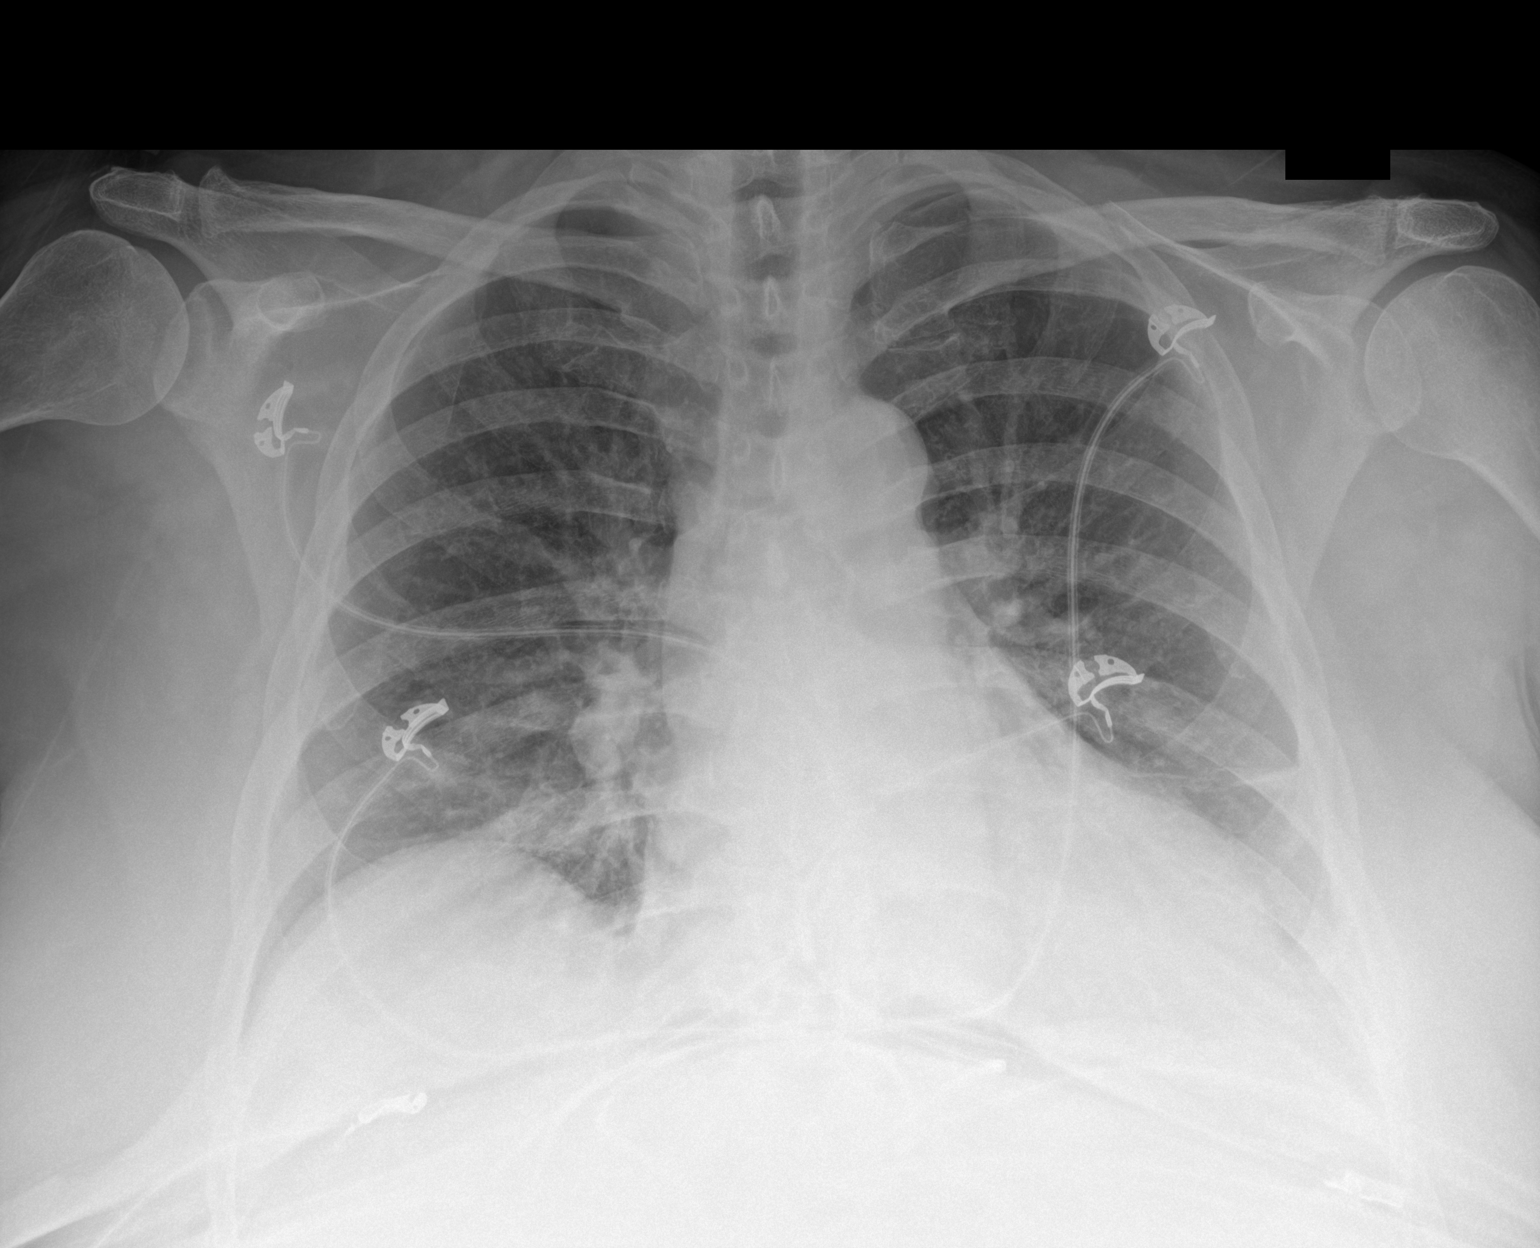

[1 of 1 positions shown; findings below may reference images not displayed]

FINDINGS: Mild cardiomegaly with vascular congestion. Possible small left
effusion. No consolidation or pneumothorax
IMPRESSION: Mild cardiomegaly with vascular congestion and possible small left
effusion

## 2022-01-17 MED ORDER — HEPARIN SODIUM (PORCINE) 5000 UNIT/ML IJ SOLN
5000.0000 [IU] | Freq: Three times a day (TID) | INTRAMUSCULAR | Status: DC
  Administered 2022-01-17 – 2022-01-27 (×30): 5000 [IU] via SUBCUTANEOUS
  Filled 2022-01-17 (×30): qty 1

## 2022-01-17 MED ORDER — FUROSEMIDE 10 MG/ML IJ SOLN
40.0000 mg | Freq: Two times a day (BID) | INTRAMUSCULAR | Status: DC
  Administered 2022-01-17: 23:00:00 40 mg via INTRAVENOUS
  Filled 2022-01-17 (×2): qty 4

## 2022-01-17 MED ORDER — ACETAMINOPHEN 650 MG RE SUPP: 650.0000 mg | Freq: Four times a day (QID) | RECTAL | Status: DC | PRN

## 2022-01-17 MED ORDER — INSULIN ASPART 100 UNIT/ML IJ SOLN
0.0000 [IU] | Freq: Three times a day (TID) | INTRAMUSCULAR | Status: DC
  Administered 2022-01-18: 12:00:00 1 [IU] via SUBCUTANEOUS
  Administered 2022-01-18: 09:00:00 2 [IU] via SUBCUTANEOUS
  Administered 2022-01-18 – 2022-01-19 (×3): 1 [IU] via SUBCUTANEOUS
  Filled 2022-01-17 (×5): qty 1

## 2022-01-17 MED ORDER — LABETALOL HCL 5 MG/ML IV SOLN
10.0000 mg | Freq: Once | INTRAVENOUS | Status: AC
  Administered 2022-01-17: 23:00:00 10 mg via INTRAVENOUS
  Filled 2022-01-17: qty 4

## 2022-01-17 MED ORDER — HALOPERIDOL LACTATE 5 MG/ML IJ SOLN
0.5000 mg | Freq: Four times a day (QID) | INTRAMUSCULAR | Status: AC | PRN
  Administered 2022-01-17: 0.5 mg via INTRAVENOUS
  Filled 2022-01-17: qty 1

## 2022-01-17 MED ORDER — ONDANSETRON HCL 4 MG PO TABS: 4.0000 mg | ORAL_TABLET | Freq: Four times a day (QID) | ORAL | Status: DC | PRN

## 2022-01-17 MED ORDER — LABETALOL HCL 5 MG/ML IV SOLN
10.0000 mg | INTRAVENOUS | Status: DC | PRN
  Administered 2022-01-18 (×3): 10 mg via INTRAVENOUS
  Filled 2022-01-17 (×3): qty 4

## 2022-01-17 MED ORDER — INSULIN ASPART 100 UNIT/ML IJ SOLN
0.0000 [IU] | Freq: Every day | INTRAMUSCULAR | Status: DC
  Administered 2022-01-17 – 2022-01-18 (×2): 2 [IU] via SUBCUTANEOUS
  Filled 2022-01-17 (×2): qty 1

## 2022-01-17 MED ORDER — ACETAMINOPHEN 325 MG PO TABS
650.0000 mg | ORAL_TABLET | Freq: Four times a day (QID) | ORAL | Status: DC | PRN
  Filled 2022-01-17: qty 2

## 2022-01-17 MED ORDER — ONDANSETRON HCL 4 MG/2ML IJ SOLN: 4.0000 mg | Freq: Four times a day (QID) | INTRAMUSCULAR | Status: DC | PRN

## 2022-01-17 NOTE — ED Notes (Signed)
Pt placed on cardiac, pulse ox, BP monitors.

## 2022-01-17 NOTE — ED Notes (Signed)
Pt placed back on cardiac, pulse ox, bp monitors.

## 2022-01-17 NOTE — ED Triage Notes (Signed)
Pt BIB EMS from home for AMS x48 hours. Pt has not had her home hemodialysis in about 1 wk. Pt brought in with IVC.

## 2022-01-17 NOTE — ED Notes (Signed)
Light green, lavender, blue, red, dark green on ice, gray on ice tubes sent to lab.

## 2022-01-17 NOTE — ED Notes (Signed)
VBG & COVID swab sent to lab.   This RN called & let RT know that the VBG was being sent to lab.

## 2022-01-17 NOTE — ED Notes (Signed)
Xray at BS 

## 2022-01-17 NOTE — ED Notes (Signed)
Pt BIB EMS from home with IVC for AMS x48 hrs. Pt has not had home hemodialysis in at least 1 week. Pts husband is unsure of what of her daily meds she is taking, if any. Pt A&O to self & place only.

## 2022-01-17 NOTE — H&P (Deleted)
History and Physical    Regina Jackson YFV:[id number] DOB: 1956/05/08 DOA: 01/13/2022  PCP: System, Provider Not In   Patient coming from: home  I have personally briefly reviewed patient's relevant medical records in [place]  Chief Complaint: altered mental status, agitation  HPI: Regina Jackson is a 66 y.o. 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  History is limited due to mental status.  She has had no vomiting or diarrhea, no chest pain or shortness of breath and no abdominal pain.  Has had no falls.    ED course: On arrival, BP 204/107, pulse 96 with otherwise normal vitals Blood work: VBG with pH 7.30, PCO2 33 CBC: WBC 11.3 and hemoglobin 10.9 CMP with bicarb of 16 and otherwise in keeping with ESRD Troponin 42 and BNP 329 COVID and influenza PCR negative Urinalysis pending  EKG, personally viewed and interpreted: Sinus rhythm at 97 with no acute ST-T wave changes  Imaging: CT head nonacute Chest x-ray: Mild cardiomegaly with vascular congestion and possible small left effusion  The ED provider spoke with nephrologist, Dr. Candiss Norse who will dialyze in the a.m. Hospitalist consulted for admission.  TSH ordered at admission   Review of Systems: As per HPI otherwise all other systems on review of systems negative.   Assessment/Plan    Acute metabolic encephalopathy with agitation requiring involuntary commitment -CT head negative - Possible mild uremia, no stigmata of infection - Follow-up TSH, follow-up UA - Continue IVC for now - Haldol as needed - Fall and aspiration precautions -Addendum post admission: TSH returned at 58,000. Possible Myxedema. Patient started on IV  levothyroxine --Possible myxedema crisis: per UpToDate, "a more activated presentation may occur with prominent psychotic features..."     Hypothyroidism ? Myxedema crisis   H/O radioactive iodine thyroid ablation - Follow-up TSH with IV levothyroxine if elevated -Addendum post admission: TSH returned at 58,000.  Patient started on IV levothyroxine -IV levothyroxine 200mg  - Place in stepdown - Obtain T4, cortisol level     Hypertensive urgency - Secondary to noncompliance with dialysis - Labetalol as needed  Fluid overload Non-compliance with home hemodialysis [facility]) - Nephrology consulted and will dialyze in the a.m. - IV Lasix as patient still produces urine    Elevated troponin - Suspect demand ischemia from fluid overload - Continue to trend    Anemia of chronic kidney failure, stage 5 (HCC) - Monitor H&H    Type 2 diabetes mellitus with kidney complication, with long-term current use of insulin  - Sliding scale insulin coverage    Endometrial cancer (HCC) - No acute issues suspected   DVT prophylaxis: Heparin Code Status: full code  Family Communication:  none  Disposition Plan: Back to previous home environment Consults called: Nephrology Status:At the time of admission, it appears that the appropriate admission status for this patient is INPATIENT. This is judged to be reasonable and necessary in order to provide the required intensity of service to ensure the patient's safety given the presenting symptoms, physical exam findings, and initial radiographic and laboratory data in the context of their  Comorbid conditions.   Patient requires inpatient status due to high intensity of service, high risk for further deterioration and high frequency of surveillance required.  I certify that at the point of admission it is my clinical judgment that the patient will require inpatient hospital care spanning beyond 2 midnights     Physical Exam: Vitals:   01/09/2022 2015  01/01/2022 2030 01/04/2022 2100 01/21/2022 2230  BP: (!) 194/87 (!) 165/153 (!) 188/88 (!) 169/87  Pulse: 93 92 95   Resp: 17 17    Temp:      TempSrc:      SpO2: 100% 99% 100%   Height:       Constitutional: Alert, , confused . Not in any apparent distress HEENT:      Head: Normocephalic and atraumatic.         Eyes: PERLA, EOMI, Conjunctivae are normal. Sclera is non-icteric.       Mouth/Throat: Mucous membranes are moist.       Neck: Supple with no signs of meningismus. Cardiovascular: Regular rate and rhythm. No murmurs, gallops, or rubs. 2+ symmetrical distal pulses are present . No JVD. No  LE edema Respiratory: Respiratory effort normal .Lungs sounds clear bilaterally. No wheezes, crackles, or rhonchi.  Gastrointestinal: Soft, non tender, non distended. Positive bowel sounds.  Genitourinary: No CVA tenderness. Musculoskeletal: Nontender with normal range of motion in all extremities. No cyanosis, or erythema of extremities. Neurologic:  Face is symmetric. Moving all extremities. No gross focal neurologic deficits . Skin: Skin is warm, dry.  No rash or ulcers Psychiatric: restless    Past Medical History:  Diagnosis Date   Hypertension    Renal disorder     History reviewed. No pertinent surgical history.   has no history on file for tobacco use, alcohol use, and drug use.  No Known Allergies  History reviewed. No pertinent family history.    Prior to Admission medications   Not on File      Labs on Admission: I have personally reviewed following labs and imaging studies  CBC: Recent Labs  Lab 01/03/2022 1857  WBC 11.3*  NEUTROABS 8.9*  HGB 10.9*  HCT 33.7*  MCV 92.1  PLT 174   Basic Metabolic Panel: Recent Labs  Lab 01/18/2022 1857  NA 134*  K 4.4  CL 105  CO2 16*  GLUCOSE 211*  BUN 49*  CREATININE 5.90*  CALCIUM 9.7   GFR: CrCl cannot be calculated (Unknown ideal weight.). Liver Function Tests: Recent Labs  Lab 12/26/2021 1857  AST 39  ALT  22  ALKPHOS 132*  BILITOT 0.8  PROT 7.6  ALBUMIN 3.5   No results for input(s): LIPASE, AMYLASE in the last 168 hours. No results for input(s): AMMONIA in the last 168 hours. Coagulation Profile: No results for input(s): INR, PROTIME in the last 168 hours. Cardiac Enzymes: No results for input(s): CKTOTAL, CKMB, CKMBINDEX, TROPONINI in the last 168 hours. BNP (last 3 results) No results for input(s): PROBNP in the last 8760 hours. HbA1C: No results for input(s): HGBA1C in the last 72 hours. CBG: No results for input(s): GLUCAP in the last 168 hours. Lipid Profile: No results for input(s): CHOL, HDL, LDLCALC, TRIG, CHOLHDL, LDLDIRECT in the last 72 hours. Thyroid Function Tests: No results for input(s): TSH, T4TOTAL, FREET4, T3FREE, THYROIDAB in the last 72 hours. Anemia Panel: No results for input(s): VITAMINB12, FOLATE, FERRITIN, TIBC, IRON, RETICCTPCT in the last 72 hours. Urine analysis: No results found for: COLORURINE, APPEARANCEUR, LABSPEC, [place], GLUCOSEU, HGBUR, BILIRUBINUR, KETONESUR, PROTEINUR, UROBILINOGEN, NITRITE, LEUKOCYTESUR  Radiological Exams on Admission: CT Head Wo Contrast  Result Date: 01/20/2022 CLINICAL DATA:  Mental status  change, unknown cause EXAM: CT HEAD WITHOUT CONTRAST TECHNIQUE: Contiguous axial images were obtained from the base of the skull through the vertex without intravenous contrast. RADIATION DOSE REDUCTION: This exam was performed according to the departmental dose-optimization program which includes automated exposure control, adjustment of the mA and/or kV according to patient size and/or use of iterative reconstruction technique. COMPARISON:  None. FINDINGS: Brain: No intracranial hemorrhage, mass effect, or midline shift. No hydrocephalus. The basilar cisterns are patent. Mild periventricular and deep white matter hypodensity typical of chronic small vessel ischemia. No evidence of territorial infarct or acute ischemia. No extra-axial or  intracranial fluid collection. Vascular: Atherosclerosis of skullbase vasculature without hyperdense vessel or abnormal calcification. Skull: Sclerotic cortical thickening of the outer table of the right occipital bone, nonaggressive in appearance. Frontal hyperostosis. No fracture or suspicious bone lesion. Sinuses/Orbits: Paranasal sinuses and mastoid air cells are clear. The visualized orbits are unremarkable. Other: None. IMPRESSION: 1. No acute intracranial abnormality. 2. Mild chronic small vessel ischemia. Electronically Signed   By: Keith Rake M.D.   On: 01/23/2022 22:32   DG Chest Port 1 View  Result Date: 01/09/2022 CLINICAL DATA:  Missed dialysis EXAM: PORTABLE CHEST 1 VIEW COMPARISON:  None. FINDINGS: Mild cardiomegaly with vascular congestion. Possible small left effusion. No consolidation or pneumothorax IMPRESSION: Mild cardiomegaly with vascular congestion and possible small left effusion Electronically Signed   By: Donavan Foil M.D.   On: 12/25/2021 20:29       Athena Masse MD [facility]   01/24/2022, 11:00 PM

## 2022-01-17 NOTE — ED Provider Notes (Signed)
[facility] Provider Note    Event Date/Time   First MD Initiated Contact with Patient 01/15/2022 1839     (approximate)   History   Altered Mental Status  HPI Regina Jackson is a 66 y.o. female with a past medical history of end-stage renal disease on hemodialysis at home 3 times a week who presents via EMS and under IVC for altered mental status.  Patient denies any complaints at this time however she is only oriented to self and therefore further history obtained from EMS obtained from patient's husband.  Patient has been noncompliant with her hemodialysis over the past week and husband was concerned as she has become increasingly agitated over this time.     Physical Exam   Triage Vital Signs: ED Triage Vitals  Enc Vitals Group     BP      Pulse      Resp      Temp      Temp src      SpO2      Weight      Height      Head Circumference      Peak Flow      Pain Score      Pain Loc      Pain Edu?      Excl. in [place]?     Most recent vital signs: Vitals:   01/16/2022 2100 12/29/2021 2230  BP: (!) 188/88 (!) 169/87  Pulse: 95   Resp:    Temp:    SpO2: 100%     General: Awake, no distress.  CV:  Good peripheral perfusion.  Resp:  Normal effort.  Abd:  No distention.  Other:  Oriented only to person.  Cooperative   ED Results / Procedures / Treatments   Labs (all labs ordered are listed, but only abnormal results are displayed) Labs Reviewed  BRAIN NATRIURETIC PEPTIDE - Abnormal; Notable for the following components:      Result Value   B Natriuretic Peptide 329.4 (*)    All other components within normal limits  COMPREHENSIVE METABOLIC PANEL - Abnormal; Notable for the following components:   Sodium 134 (*)    CO2 16 (*)    Glucose, Bld 211 (*)    BUN 49 (*)    Creatinine, Ser 5.90 (*)    Alkaline Phosphatase 132 (*)    GFR, Estimated 8 (*)    All other components within normal limits  CBC WITH DIFFERENTIAL/PLATELET - Abnormal;  Notable for the following components:   WBC 11.3 (*)    RBC 3.66 (*)    Hemoglobin 10.9 (*)    HCT 33.7 (*)    Neutro Abs 8.9 (*)    All other components within normal limits  BLOOD GAS, VENOUS - Abnormal; Notable for the following components:   pCO2, Ven 33 (*)    pO2, Ven 54.0 (*)    Bicarbonate 16.2 (*)    Acid-base deficit 9.3 (*)    All other components within normal limits  TROPONIN I (HIGH SENSITIVITY) - Abnormal; Notable for the following components:   Troponin I (High Sensitivity) 42 (*)    All other components within normal limits  RESP PANEL BY RT-PCR (FLU A&B, COVID) ARPGX2  URINALYSIS, ROUTINE W REFLEX MICROSCOPIC  TSH  TROPONIN I (HIGH SENSITIVITY)     EKG ED ECG REPORT I, Naaman Plummer, the attending physician, personally viewed and interpreted this ECG.  Date: 01/02/2022 EKG Time: 1906  Rate: 97 Rhythm: normal sinus rhythm QRS Axis: normal Intervals: normal ST/T Wave abnormalities: normal Narrative Interpretation: no evidence of acute ischemia   RADIOLOGY ED MD interpretation: CT of the head without contrast shows no evidence of acute abnormalities including no intracerebral hemorrhage, obvious masses, or significant edema  Chest x-ray single view portable shows mild cardiomegaly with vascular congestion and possible small left pleural effusion  Agree with radiology assessment  Official radiology report(s): CT Head Wo Contrast  Result Date: 01/05/2022 CLINICAL DATA:  Mental status change, unknown cause EXAM: CT HEAD WITHOUT CONTRAST TECHNIQUE: Contiguous axial images were obtained from the base of the skull through the vertex without intravenous contrast. RADIATION DOSE REDUCTION: This exam was performed according to the departmental dose-optimization program which includes automated exposure control, adjustment of the mA and/or kV according to patient size and/or use of iterative reconstruction technique. COMPARISON:  None. FINDINGS: Brain: No  intracranial hemorrhage, mass effect, or midline shift. No hydrocephalus. The basilar cisterns are patent. Mild periventricular and deep white matter hypodensity typical of chronic small vessel ischemia. No evidence of territorial infarct or acute ischemia. No extra-axial or intracranial fluid collection. Vascular: Atherosclerosis of skullbase vasculature without hyperdense vessel or abnormal calcification. Skull: Sclerotic cortical thickening of the outer table of the right occipital bone, nonaggressive in appearance. Frontal hyperostosis. No fracture or suspicious bone lesion. Sinuses/Orbits: Paranasal sinuses and mastoid air cells are clear. The visualized orbits are unremarkable. Other: None. IMPRESSION: 1. No acute intracranial abnormality. 2. Mild chronic small vessel ischemia. Electronically Signed   By: Keith Rake M.D.   On: 01/03/2022 22:32   DG Chest Port 1 View  Result Date: 01/11/2022 CLINICAL DATA:  Missed dialysis EXAM: PORTABLE CHEST 1 VIEW COMPARISON:  None. FINDINGS: Mild cardiomegaly with vascular congestion. Possible small left effusion. No consolidation or pneumothorax IMPRESSION: Mild cardiomegaly with vascular congestion and possible small left effusion Electronically Signed   By: Donavan Foil M.D.   On: 01/08/2022 20:29      PROCEDURES:  Critical Care performed: No  .1-3 Lead EKG Interpretation Performed by: Naaman Plummer, MD Authorized by: Naaman Plummer, MD     Interpretation: normal     ECG rate:  95   ECG rate assessment: normal     Rhythm: sinus rhythm     Ectopy: none     Conduction: normal     MEDICATIONS ORDERED IN ED: Medications  labetalol (NORMODYNE) injection 10 mg (10 mg Intravenous Given 01/23/2022 2233)     IMPRESSION / MDM / ASSESSMENT AND PLAN / ED COURSE  I reviewed the triage vital signs and the nursing notes.                             The patient is on the cardiac monitor to evaluate for evidence of arrhythmia and/or significant  heart rate changes. Patient presents for altered mental status of unknown origin  Will obtain medical workup and discuss with social work to try to obtain collateral information.  Given History, Physical, and Workup there is no overt concern for a dangerous emergent cause such as, but not limited to, CNS infection, severe Toxidrome, severe metabolic derangement, or stroke. CBC shows mild leukocytosis of 11.3 and stable anemia at 10.9 High-sensitivity troponin elevated at 42 which is likely baseline for patient given ESRD CMP concerning for: Sodium 134 CO2 16 Creatinine/BUN 5.9/49  head CT shows no evidence of acute abnormalities Chest x-ray shows cardiomegaly with  pulmonary edema and pleural effusion  Disposition: Admit; the patient is suffering altered mental status that is persistent and therefore they will be admitted.  Clinical Course as of 01/03/2022 2252  Tue Jan 17, 2022  2127 BUN(!): 49 [EB]    Clinical Course User Index [EB] Naaman Plummer, MD     FINAL CLINICAL IMPRESSION(S) / ED DIAGNOSES   Final diagnoses:  None     Rx / DC Orders   ED Discharge Orders     None        Note:  This document was prepared using Dragon voice recognition software and may include unintentional dictation errors.   Naaman Plummer, MD 01/07/2022 2256

## 2022-01-17 NOTE — ED Notes (Signed)
This RN called & spoke with lab about receiving blood sent down & orders just recently put in. They will receive this.

## 2022-01-18 ENCOUNTER — Encounter: Payer: Self-pay | Admitting: Internal Medicine

## 2022-01-18 DIAGNOSIS — I160 Hypertensive urgency: Secondary | ICD-10-CM | POA: Diagnosis not present

## 2022-01-18 DIAGNOSIS — G9341 Metabolic encephalopathy: Secondary | ICD-10-CM | POA: Diagnosis not present

## 2022-01-18 DIAGNOSIS — Z9115 Patient's noncompliance with renal dialysis: Secondary | ICD-10-CM

## 2022-01-18 DIAGNOSIS — E039 Hypothyroidism, unspecified: Secondary | ICD-10-CM

## 2022-01-18 LAB — URINALYSIS, ROUTINE W REFLEX MICROSCOPIC
Bilirubin Urine: NEGATIVE
Glucose, UA: 100 mg/dL — AB
Ketones, ur: 15 mg/dL — AB
Leukocytes,Ua: NEGATIVE
Nitrite: NEGATIVE
Protein, ur: >300 mg/dL — AB
Specific Gravity, Urine: 1.02 (ref 1.005–1.030)
pH: 5 (ref 5.0–8.0)

## 2022-01-18 LAB — URINALYSIS, MICROSCOPIC (REFLEX)

## 2022-01-18 LAB — CBG MONITORING, ED
Glucose-Capillary: 225 mg/dL — ABNORMAL HIGH (ref 70–99)
Glucose-Capillary: 178 mg/dL — ABNORMAL HIGH (ref 70–99)
Glucose-Capillary: 183 mg/dL — ABNORMAL HIGH (ref 70–99)
Glucose-Capillary: 215 mg/dL — ABNORMAL HIGH (ref 70–99)

## 2022-01-18 LAB — HIV ANTIBODY (ROUTINE TESTING W REFLEX): HIV Screen 4th Generation wRfx: NONREACTIVE

## 2022-01-18 LAB — T4, FREE: Free T4: 0.87 ng/dL (ref 0.61–1.12)

## 2022-01-18 LAB — CORTISOL: Cortisol, Plasma: 22.7 ug/dL

## 2022-01-18 LAB — TSH: TSH: 58.812 u[IU]/mL — ABNORMAL HIGH (ref 0.350–4.500)

## 2022-01-18 MED ORDER — LORAZEPAM 2 MG/ML IJ SOLN
0.5000 mg | Freq: Once | INTRAMUSCULAR | Status: AC
  Administered 2022-01-18: 11:00:00 0.5 mg via INTRAVENOUS
  Filled 2022-01-18: qty 1

## 2022-01-18 MED ORDER — HYDRALAZINE HCL 50 MG PO TABS
25.0000 mg | ORAL_TABLET | Freq: Three times a day (TID) | ORAL | Status: DC
  Administered 2022-01-18 – 2022-01-20 (×4): 25 mg via ORAL
  Filled 2022-01-18 (×5): qty 1

## 2022-01-18 MED ORDER — LEVOTHYROXINE SODIUM 100 MCG/5ML IV SOLN
100.0000 ug | Freq: Every day | INTRAVENOUS | Status: DC
  Filled 2022-01-18: qty 5

## 2022-01-18 MED ORDER — ROPINIROLE HCL 0.25 MG PO TABS
0.2500 mg | ORAL_TABLET | Freq: Once | ORAL | Status: AC
  Administered 2022-01-18: 16:00:00 0.25 mg via ORAL
  Filled 2022-01-18 (×2): qty 1

## 2022-01-18 MED ORDER — ROPINIROLE HCL 0.25 MG PO TABS
0.2500 mg | ORAL_TABLET | Freq: Every day | ORAL | Status: DC
  Administered 2022-01-18 – 2022-01-20 (×2): 0.25 mg via ORAL
  Filled 2022-01-18 (×4): qty 1

## 2022-01-18 MED ORDER — HALOPERIDOL LACTATE 5 MG/ML IJ SOLN
5.0000 mg | Freq: Four times a day (QID) | INTRAMUSCULAR | Status: DC | PRN
  Administered 2022-01-18 – 2022-01-19 (×4): 5 mg via INTRAMUSCULAR
  Filled 2022-01-18 (×5): qty 1

## 2022-01-18 MED ORDER — LEVOTHYROXINE SODIUM 100 MCG/5ML IV SOLN
100.0000 ug | Freq: Every day | INTRAVENOUS | Status: DC
  Administered 2022-01-19 – 2022-01-25 (×7): 100 ug via INTRAVENOUS
  Filled 2022-01-18 (×7): qty 5

## 2022-01-18 MED ORDER — HALOPERIDOL LACTATE 5 MG/ML IJ SOLN
10.0000 mg | Freq: Once | INTRAMUSCULAR | Status: AC
  Administered 2022-01-18: 12:00:00 10 mg via INTRAMUSCULAR

## 2022-01-18 MED ORDER — AMLODIPINE BESYLATE 10 MG PO TABS
10.0000 mg | ORAL_TABLET | Freq: Every day | ORAL | Status: DC
  Administered 2022-01-18 – 2022-01-19 (×2): 10 mg via ORAL
  Filled 2022-01-18: qty 1
  Filled 2022-01-18: qty 2

## 2022-01-18 MED ORDER — HALOPERIDOL LACTATE 5 MG/ML IJ SOLN
INTRAMUSCULAR | Status: AC
  Filled 2022-01-18: qty 2

## 2022-01-18 MED ORDER — OLANZAPINE 5 MG PO TABS
5.0000 mg | ORAL_TABLET | Freq: Once | ORAL | Status: AC
  Administered 2022-01-18: 05:00:00 5 mg via ORAL
  Filled 2022-01-18: qty 1

## 2022-01-18 MED ORDER — LEVOTHYROXINE SODIUM 100 MCG/5ML IV SOLN
200.0000 ug | Freq: Every day | INTRAVENOUS | Status: DC
  Administered 2022-01-18: 02:00:00 200 ug via INTRAVENOUS
  Filled 2022-01-18: qty 10

## 2022-01-18 MED ORDER — QUETIAPINE FUMARATE 25 MG PO TABS: 25.0000 mg | ORAL_TABLET | Freq: Every day | ORAL | Status: DC

## 2022-01-18 MED ORDER — QUETIAPINE FUMARATE 25 MG PO TABS
25.0000 mg | ORAL_TABLET | Freq: Two times a day (BID) | ORAL | Status: DC
  Administered 2022-01-18 – 2022-01-19 (×3): 25 mg via ORAL
  Filled 2022-01-18 (×3): qty 1

## 2022-01-18 NOTE — ED Notes (Signed)
Verbal consent for dialysis gotten from husband Saavi Mceachron. This nurse and Dr.Singh witnessed and signed consent form.

## 2022-01-18 NOTE — Progress Notes (Signed)
Progress Note    Regina Jackson  MEQ:[id number] DOB: 15-May-1956  DOA: 01/12/2022 PCP: System, Provider Not In      Brief Narrative:    Medical records reviewed and are as summarized below:  Regina Jackson is a 66 y.o. female with medical history significant for ESRD on hemodialysis, medical nonadherence with dialysis, anemia of chronic disease, endometrial cancer, hypothyroidism, radioactive iodine, hypertension, diabetes mellitus.  She was brought to the hospital because of altered mental status and increasing agitation.  She was placed under IVC.      Assessment/Plan:   Principal Problem:   Acute metabolic encephalopathy Active Problems:   Chronic home hemodialysis status (HCC)   Hypertensive urgency   Non-compliance with renal dialysis (HCC)   Anemia of chronic kidney failure, stage 5 (HCC)   Elevated troponin   Fluid overload   Involuntary commitment   Type 2 diabetes mellitus with kidney complication, with long-term current use of insulin (HCC)   Hypothyroidism   Endometrial cancer ([place])   H/O radioactive iodine thyroid ablation    Acute metabolic encephalopathy with agitation requiring involuntary commitment: Use IM Haldol as needed for agitation.  Start Seroquel to help with agitation as well.  Hypertensive encephalopathy/emergency: Unknown if elevated blood pressure is contributing to change in mental status.  Start amlodipine and hydralazine.  Use IV labetalol for severe hypertension.    Hypothyroidism: TSH was 58.812 but free T4 was normal at 0.87.  Repeat thyroid function tests.  Continue IV levothyroxine for now.  ESRD: She has been evaluated by nephrologist.  She too agitated for hemodialysis today.  Plan for hemodialysis tomorrow  Elevated troponin: This is likely from demand ischemia  Type II DM with hyperglycemia: Use NovoLog as needed  History of medical nonadherence, endometrial cancer  Diet Order             Diet heart healthy/carb  modified Room service appropriate? Yes; Fluid consistency: Thin  Diet effective now                      Consultants: Nephrologist  Procedures: None    Medications:    heparin  5,000 Units Subcutaneous Q8H   insulin aspart  0-5 Units Subcutaneous QHS   insulin aspart  0-6 Units Subcutaneous TID WC   [START ON 01/19/2022] levothyroxine  100 mcg Intravenous Daily   QUEtiapine  25 mg Oral BID   rOPINIRole  0.25 mg Oral QHS   Continuous Infusions:   Anti-infectives (From admission, onward)    None              Family Communication/Anticipated D/C date and plan/Code Status   DVT prophylaxis: heparin injection 5,000 Units Start: 12/25/2021 2315     Code Status: Full Code  Family Communication: I called her husband at [phone] but there was no response.  I left a voice message for him to call back. Disposition Plan: Plan to discharge home when medically stable   Status is: Inpatient  Remains inpatient appropriate because: Altered mental status           Subjective:   Interval events noted.  She is confused and agitated.  She is unable to provide any history.  Objective:    Vitals:   01/18/22 1230 01/18/22 1300 01/18/22 1330 01/18/22 1408  BP: (!) 181/88 (!) 197/158 (!) 198/114 (!) 197/88  Pulse: 81 86 85 79  Resp: 18 (!) 21 20 19   Temp:  TempSrc:      SpO2: 99% 98% 98% 97%  Height:       No data found.  No intake or output data in the 24 hours ending 01/18/22 1635 There were no vitals filed for this visit.  Exam:  GEN: NAD SKIN: Warm and dry EYES: No pallor or icterus ENT: MMM CV: RRR PULM: CTA B ABD: soft, obese, NT, +BS CNS: Alert but confused. EXT: No edema or tenderness PSYCH: She was very agitated during my encounter       Data Reviewed:   I have personally reviewed following labs and imaging studies:  Labs: Labs show the following:   Basic Metabolic Panel: Recent Labs  Lab 01/16/2022 1857  NA  134*  K 4.4  CL 105  CO2 16*  GLUCOSE 211*  BUN 49*  CREATININE 5.90*  CALCIUM 9.7   GFR CrCl cannot be calculated (Unknown ideal weight.). Liver Function Tests: Recent Labs  Lab 01/22/2022 1857  AST 39  ALT 22  ALKPHOS 132*  BILITOT 0.8  PROT 7.6  ALBUMIN 3.5   No results for input(s): LIPASE, AMYLASE in the last 168 hours. No results for input(s): AMMONIA in the last 168 hours. Coagulation profile No results for input(s): INR, PROTIME in the last 168 hours.  CBC: Recent Labs  Lab 12/31/2021 1857  WBC 11.3*  NEUTROABS 8.9*  HGB 10.9*  HCT 33.7*  MCV 92.1  PLT 281   Cardiac Enzymes: No results for input(s): CKTOTAL, CKMB, CKMBINDEX, TROPONINI in the last 168 hours. BNP (last 3 results) No results for input(s): PROBNP in the last 8760 hours. CBG: Recent Labs  Lab 01/06/2022 2319 01/18/22 0739 01/18/22 1126 01/18/22 1618  GLUCAP 215* 225* 178* 183*   D-Dimer: No results for input(s): DDIMER in the last 72 hours. Hgb A1c: No results for input(s): HGBA1C in the last 72 hours. Lipid Profile: No results for input(s): CHOL, HDL, LDLCALC, TRIG, CHOLHDL, LDLDIRECT in the last 72 hours. Thyroid function studies: Recent Labs    01/09/2022 April 07, 2007  TSH 58.812*   Anemia work up: No results for input(s): VITAMINB12, FOLATE, FERRITIN, TIBC, IRON, RETICCTPCT in the last 72 hours. Sepsis Labs: Recent Labs  Lab 01/08/2022 1857  WBC 11.3*    Microbiology Recent Results (from the past 240 hour(s))  Resp Panel by RT-PCR (Flu A&B, Covid) Nasopharyngeal Swab     Status: None   Collection Time: 01/16/2022  8:30 PM   Specimen: Nasopharyngeal Swab; Nasopharyngeal(NP) swabs in vial transport medium  Result Value Ref Range Status   SARS Coronavirus 2 by RT PCR NEGATIVE NEGATIVE Final    Comment: (NOTE) SARS-CoV-2 target nucleic acids are NOT DETECTED.  The SARS-CoV-2 RNA is generally detectable in upper respiratory specimens during the acute phase of infection. The  lowest concentration of SARS-CoV-2 viral copies this assay can detect is 138 copies/mL. A negative result does not preclude SARS-Cov-2 infection and should not be used as the sole basis for treatment or other patient management decisions. A negative result may occur with  improper specimen collection/handling, submission of specimen other than nasopharyngeal swab, presence of viral mutation(s) within the areas targeted by this assay, and inadequate number of viral copies(<138 copies/mL). A negative result must be combined with clinical observations, patient history, and epidemiological information. The expected result is Negative.  Fact Sheet for Patients:  EntrepreneurPulse.com.au  Fact Sheet for Healthcare Providers:  IncredibleEmployment.be  This test is no t yet approved or cleared by the [place] FDA and  has  been authorized for detection and/or diagnosis of SARS-CoV-2 by FDA under an Emergency Use Authorization (EUA). This EUA will remain  in effect (meaning this test can be used) for the duration of the COVID-19 declaration under Section 564(b)(1) of the Act, 21 U.S.C.section 360bbb-3(b)(1), unless the authorization is terminated  or revoked sooner.       Influenza A by PCR NEGATIVE NEGATIVE Final   Influenza B by PCR NEGATIVE NEGATIVE Final    Comment: (NOTE) The Xpert Xpress SARS-CoV-2/FLU/RSV plus assay is intended as an aid in the diagnosis of influenza from Nasopharyngeal swab specimens and should not be used as a sole basis for treatment. Nasal washings and aspirates are unacceptable for Xpert Xpress SARS-CoV-2/FLU/RSV testing.  Fact Sheet for Patients: EntrepreneurPulse.com.au  Fact Sheet for Healthcare Providers: IncredibleEmployment.be  This test is not yet approved or cleared by the [place] FDA and has been authorized for detection and/or diagnosis of SARS-CoV-2 by FDA under  an Emergency Use Authorization (EUA). This EUA will remain in effect (meaning this test can be used) for the duration of the COVID-19 declaration under Section 564(b)(1) of the Act, 21 U.S.C. section 360bbb-3(b)(1), unless the authorization is terminated or revoked.  Performed at [facility], [address] [place],  [address]     Procedures and diagnostic studies:  CT Head Wo Contrast  Result Date: 01/18/2022 CLINICAL DATA:  Mental status change, unknown cause EXAM: CT HEAD WITHOUT CONTRAST TECHNIQUE: Contiguous axial images were obtained from the base of the skull through the vertex without intravenous contrast. RADIATION DOSE REDUCTION: This exam was performed according to the departmental dose-optimization program which includes automated exposure control, adjustment of the mA and/or kV according to patient size and/or use of iterative reconstruction technique. COMPARISON:  None. FINDINGS: Brain: No intracranial hemorrhage, mass effect, or midline shift. No hydrocephalus. The basilar cisterns are patent. Mild periventricular and deep white matter hypodensity typical of chronic small vessel ischemia. No evidence of territorial infarct or acute ischemia. No extra-axial or intracranial fluid collection. Vascular: Atherosclerosis of skullbase vasculature without hyperdense vessel or abnormal calcification. Skull: Sclerotic cortical thickening of the outer table of the right occipital bone, nonaggressive in appearance. Frontal hyperostosis. No fracture or suspicious bone lesion. Sinuses/Orbits: Paranasal sinuses and mastoid air cells are clear. The visualized orbits are unremarkable. Other: None. IMPRESSION: 1. No acute intracranial abnormality. 2. Mild chronic small vessel ischemia. Electronically Signed   By: Keith Rake M.D.   On: 12/30/2021 22:32   DG Chest Port 1 View  Result Date: 01/18/2022 CLINICAL DATA:  Missed dialysis EXAM: PORTABLE CHEST 1 VIEW COMPARISON:  None.  FINDINGS: Mild cardiomegaly with vascular congestion. Possible small left effusion. No consolidation or pneumothorax IMPRESSION: Mild cardiomegaly with vascular congestion and possible small left effusion Electronically Signed   By: Donavan Foil M.D.   On: 01/12/2022 20:29               LOS: 1 day   Jacquez Sheetz  [facility]   Pager on [email or web address]. If 7PM-7AM, please contact night-coverage at [email or web address]     01/18/2022, 4:35 PM

## 2022-01-18 NOTE — ED Notes (Signed)
Pt given ice water.

## 2022-01-18 NOTE — Consult Note (Signed)
[facility] Consult Note: 01/18/2022    Date of Admission:  12/25/2021           Reason for Consult: ESRD    Referring Provider: Jennye Boroughs, MD Primary Care Provider: System, Provider Not In   History of Presenting Illness:  Regina Jackson is a 66 y.o. female with multiple Medical problems Admitted for confusion Information is obtained from the patient's husband as she is confused and not able to provide any meaningful information. Patient has been on dialysis for about 2 years.  Her husband has been doing home hemodialysis for her.  He reports patient has not been compliant.  She is supposed to do her dialysis 4 times per week but she will need another 2-3 times per week about 2 hours at a time.  After 2 hours she starts to feel cramping.  She has a left arm AV fistula.  She has good residual function and makes about 2 to 3 L of urine daily. Work-up so far shows TSH ESR elevated at 59, viral panel including SARS-CoV-2 and influenza AMB are negative.  CT of the head did not show any acute stroke but shows mild chronic small vessel ischemia.  Chest x-ray shows mild cardiomegaly and vascular congestion possible small left effusion.  ABG shows pH of 7.30, PCO2 of 33. Nephrology consult has been requested to evaluate for hemodialysis.  Nursing staff reports that patient had made the small bite to eat.  Oral intake is poor.   Review of Systems: ROS-not available due to patient's medical condition  Past Medical History:  Diagnosis Date   Hypertension    Renal disorder     Social History   Tobacco Use   Smoking status: Unknown    History reviewed. No pertinent family history.   OBJECTIVE: Blood pressure (!) 165/97, pulse 78, temperature 97.7 F (36.5 C), temperature source Oral, resp. rate 14, height 5' 7.5" (1.715 m), SpO2 100 %.  Physical Exam Physical Exam: General:  No acute distress, laying in the bed  HEENT  anicteric, dry oral mucous membrane   Pulm/lungs  normal breathing effort, lungs are clear to auscultation  CVS/Heart  irregular rhythm, no rub or gallop  Abdomen:   Soft, nontender  Extremities: Trace to 1+ peripheral edema over the feet  Neurologic:  Alert, moaning, asking to go home, not oriented to time or place  Skin:  No acute rashes     Lab Results Lab Results  Component Value Date   WBC 11.3 (H) 12/29/2021   HGB 10.9 (L) 01/24/2022   HCT 33.7 (L) 01/05/2022   MCV 92.1 01/12/2022   PLT 281 01/14/2022    Lab Results  Component Value Date   CREATININE 5.90 (H) 01/09/2022   BUN 49 (H) 01/09/2022   NA 134 (L) 01/13/2022   K 4.4 01/08/2022   CL 105 01/01/2022   CO2 16 (L) 01/01/2022    Lab Results  Component Value Date   ALT 22 01/05/2022   AST 39 01/14/2022   ALKPHOS 132 (H) 01/16/2022   BILITOT 0.8 01/18/2022     Microbiology: Recent Results (from the past 240 hour(s))  Resp Panel by RT-PCR (Flu A&B, Covid) Nasopharyngeal Swab     Status: None   Collection Time: 01/18/2022  8:30 PM   Specimen: Nasopharyngeal Swab; Nasopharyngeal(NP) swabs in vial transport medium  Result Value Ref Range Status   SARS Coronavirus 2 by RT PCR NEGATIVE NEGATIVE Final    Comment: (NOTE) SARS-CoV-2 target  nucleic acids are NOT DETECTED.  The SARS-CoV-2 RNA is generally detectable in upper respiratory specimens during the acute phase of infection. The lowest concentration of SARS-CoV-2 viral copies this assay can detect is 138 copies/mL. A negative result does not preclude SARS-Cov-2 infection and should not be used as the sole basis for treatment or other patient management decisions. A negative result may occur with  improper specimen collection/handling, submission of specimen other than nasopharyngeal swab, presence of viral mutation(s) within the areas targeted by this assay, and inadequate number of viral copies(<138 copies/mL). A negative result must be combined with clinical observations, patient history, and  epidemiological information. The expected result is Negative.  Fact Sheet for Patients:  EntrepreneurPulse.com.au  Fact Sheet for Healthcare Providers:  IncredibleEmployment.be  This test is no t yet approved or cleared by the [place] FDA and  has been authorized for detection and/or diagnosis of SARS-CoV-2 by FDA under an Emergency Use Authorization (EUA). This EUA will remain  in effect (meaning this test can be used) for the duration of the COVID-19 declaration under Section 564(b)(1) of the Act, 21 U.S.C.section 360bbb-3(b)(1), unless the authorization is terminated  or revoked sooner.       Influenza A by PCR NEGATIVE NEGATIVE Final   Influenza B by PCR NEGATIVE NEGATIVE Final    Comment: (NOTE) The Xpert Xpress SARS-CoV-2/FLU/RSV plus assay is intended as an aid in the diagnosis of influenza from Nasopharyngeal swab specimens and should not be used as a sole basis for treatment. Nasal washings and aspirates are unacceptable for Xpert Xpress SARS-CoV-2/FLU/RSV testing.  Fact Sheet for Patients: EntrepreneurPulse.com.au  Fact Sheet for Healthcare Providers: IncredibleEmployment.be  This test is not yet approved or cleared by the [place] FDA and has been authorized for detection and/or diagnosis of SARS-CoV-2 by FDA under an Emergency Use Authorization (EUA). This EUA will remain in effect (meaning this test can be used) for the duration of the COVID-19 declaration under Section 564(b)(1) of the Act, 21 U.S.C. section 360bbb-3(b)(1), unless the authorization is terminated or revoked.  Performed at [facility], [address] [place], [place] [address]     Medications: Scheduled Meds:  furosemide  40 mg Intravenous Q12H   heparin  5,000 Units Subcutaneous Q8H   insulin aspart  0-5 Units Subcutaneous QHS   insulin aspart  0-6 Units Subcutaneous TID WC   [START ON  01/19/2022] levothyroxine  100 mcg Intravenous Daily   Continuous Infusions: PRN Meds:.acetaminophen **OR** acetaminophen, labetalol, ondansetron **OR** ondansetron (ZOFRAN) IV  No Known Allergies  Urinalysis: No results for input(s): COLORURINE, LABSPEC, PHURINE, GLUCOSEU, HGBUR, BILIRUBINUR, KETONESUR, PROTEINUR, UROBILINOGEN, NITRITE, LEUKOCYTESUR in the last 72 hours.  Invalid input(s): APPERANCEUR    Imaging: CT Head Wo Contrast  Result Date: 01/20/2022 CLINICAL DATA:  Mental status change, unknown cause EXAM: CT HEAD WITHOUT CONTRAST TECHNIQUE: Contiguous axial images were obtained from the base of the skull through the vertex without intravenous contrast. RADIATION DOSE REDUCTION: This exam was performed according to the departmental dose-optimization program which includes automated exposure control, adjustment of the mA and/or kV according to patient size and/or use of iterative reconstruction technique. COMPARISON:  None. FINDINGS: Brain: No intracranial hemorrhage, mass effect, or midline shift. No hydrocephalus. The basilar cisterns are patent. Mild periventricular and deep white matter hypodensity typical of chronic small vessel ischemia. No evidence of territorial infarct or acute ischemia. No extra-axial or intracranial fluid collection. Vascular: Atherosclerosis of skullbase vasculature without hyperdense vessel or abnormal calcification. Skull: Sclerotic cortical  thickening of the outer table of the right occipital bone, nonaggressive in appearance. Frontal hyperostosis. No fracture or suspicious bone lesion. Sinuses/Orbits: Paranasal sinuses and mastoid air cells are clear. The visualized orbits are unremarkable. Other: None. IMPRESSION: 1. No acute intracranial abnormality. 2. Mild chronic small vessel ischemia. Electronically Signed   By: Keith Rake M.D.   On: 01/21/2022 22:32   DG Chest Port 1 View  Result Date: 01/20/2022 CLINICAL DATA:  Missed dialysis EXAM: PORTABLE  CHEST 1 VIEW COMPARISON:  None. FINDINGS: Mild cardiomegaly with vascular congestion. Possible small left effusion. No consolidation or pneumothorax IMPRESSION: Mild cardiomegaly with vascular congestion and possible small left effusion Electronically Signed   By: Donavan Foil M.D.   On: 01/12/2022 20:29      Assessment/Plan:  Regina Jackson is a 66 y.o. female with medical problems of  End-stage renal disease, on dialysis since 2015, hypothyroidism, hyperlipidemia, diabetes, peripheral neuropathy, morbid obesity, mono, gammopathy, ischemic optic neuropathy, regarding oral thrush, COVID-19 in September 2022 history of endometrial carcinoma, obstructive sleep apnea Home hemodialysis, followed by Dr. Blenda Bridegroom, [place]  was admitted on 01/18/2022 for :  Acute metabolic encephalopathy [U13.24]  #End-stage renal disease- HHD  #Type 2 diabetes with CKD-insulin-dependent #Anemia of chronic kidney disease #Hypothyroidism with critically elevated TSH #Acute metabolic encephalopathy and confusion #Secondary hyperparathyroidism   #End-stage renal disease #Acute metabolic encephalopathy and confusion Patient has not been compliant with her dialysis regimen but does have good residual function.  Her BUN today is 49 and potassium of 4.4.  Her mouth is dry and she appears volume depleted.  Due to confusion and agitation, I do not think she can safely perform dialysis today therefore will defer and reevaluate tomorrow. Confusion could be related to underlying uremia versus metabolic encephalopathy from hypothyroidism.  Suggest also to obtain urinalysis to rule out UTI.  #Type 2 diabetes with CKD-insulin-dependent Blood sugar and insulin dosing as per primary team. No results found for: HGBA1C   #Anemia of chronic kidney disease Lab Results  Component Value Date   HGB 10.9 (L) 12/30/2021   Monitor  #Secondary hyperparathyroidism  Lab Results  Component Value Date   CALCIUM  9.7 01/23/2022   Monitor ca/phos this admission    Jerry Clyne 01/18/22

## 2022-01-18 NOTE — ED Notes (Signed)
Mittens applied

## 2022-01-18 NOTE — ED Notes (Signed)
IV attempt x2, unsuccessful, will attempt again when pt is more calm.

## 2022-01-18 NOTE — ED Notes (Signed)
Pt crying. When asked what is wrong? Pt states nothing. Pt denies pain at this time.

## 2022-01-18 NOTE — ED Notes (Signed)
Pt transferred from rm 4 to rm 25. Pt placed on cardiac monitor at this time. RN aware and in rm.

## 2022-01-18 NOTE — ED Notes (Signed)
Pt crying, yelling at staff, and continually trying to get up. House coverage NP messaged in regards to medication to calm pt down. NP to room to observe pt.

## 2022-01-18 NOTE — ED Notes (Signed)
Pt husband at bedside. Brought pt clothes and depends. Depend placed on pt.

## 2022-01-18 NOTE — ED Notes (Signed)
Pulled tylenol due to the sitter stating the pt has a headache. When this RN attempted to give pt tylenol, pt refused medication and denied a headache. Medication wasted in pyxis.

## 2022-01-18 NOTE — ED Notes (Signed)
Pt becoming more agitated and restless with family at bedside. Family noted the same as well.

## 2022-01-18 NOTE — ED Notes (Signed)
Pt out of recliner again and trying to walk out of room naked. Pt redirected back to recliner and gown placed back on pt. Chair alarm in place.

## 2022-01-18 NOTE — ED Notes (Signed)
Husband left bedside, left a bag of pt belongings with pt. Husband states pt glasses and toiletries are in bag if needed. Pt appeared to become more calm and relaxed with family not at bedside.

## 2022-01-18 NOTE — ED Notes (Signed)
Pt extremely confused and agitated trying to get out of bed. Unable to redirect pt. Pt screaming and yelling saying she is going to the bathroom, pt thinks the bed is the toilet. Pt urinated in the bed, sheets changed, pt placed in new gown. Unable to calm pt down or redirect her. Provider at bedside.

## 2022-01-18 NOTE — ED Notes (Signed)
Pt got up out of recliner and came to door, pt began urinating on the floor and had to be redirected back to bed. Pt cleaned up, given new warm blankets and placed back in recliner with chair alarm in place. New socks placed on pt as well.

## 2022-01-18 NOTE — ED Notes (Signed)
Pt attempting to get out of bed again, trying to sit at end of bed and falling asleep. Unable to redirect pt back to the bed in a safe position. Attempting to find recliner for pt to sit in.

## 2022-01-18 NOTE — ED Notes (Signed)
Pt placed in recliner and appears more comfortable. NAD at current time.

## 2022-01-18 NOTE — ED Notes (Signed)
Mittens removed

## 2022-01-18 NOTE — H&P (Addendum)
History and Physical    Regina Jackson JJH:[id number] DOB: 09/03/1956 DOA: 01/08/2022  PCP: System, Provider Not In   Patient coming from: home  I have personally briefly reviewed patient's relevant medical records in [place]  Chief Complaint: altered mental status, agitation  HPI: Regina Jackson is a 66 y.o. 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  History is limited due to mental status.  She has had no vomiting or diarrhea, no chest pain or shortness of breath and no abdominal pain.  Has had no falls.    ED course: On arrival, BP 204/107, pulse 96 with otherwise normal vitals Blood work: VBG with pH 7.30, PCO2 33 CBC: WBC 11.3 and hemoglobin 10.9 CMP with bicarb of 16 and otherwise in keeping with ESRD Troponin 42 and BNP 329 COVID and influenza PCR negative Urinalysis pending  EKG, personally viewed and interpreted: Sinus rhythm at 97 with no acute ST-T wave changes  Imaging: CT head nonacute Chest x-ray: Mild cardiomegaly with vascular congestion and possible small left effusion  The ED provider spoke with nephrologist, Dr. Candiss Norse who will dialyze in the a.m. Hospitalist consulted for admission.  TSH ordered at admission   Review of Systems: As per HPI otherwise all other systems on review of systems negative.   Assessment/Plan    Acute metabolic encephalopathy with agitation requiring involuntary commitment -CT head negative - Possible early uremia, no stigmata of infection - Follow-up TSH, follow-up UA -Consider MRI when able to evaluate for PRES - Continue IVC for now - Haldol as needed - Fall and aspiration precautions -Addendum post admission: TSH returned at 58,000.  --No signs  of myxedema- hypotension, hypothermia, bradycardia, hypoglycemia, hyponatremia... --per [employer], "a more activated presentation may occur with prominent psychotic features..."     Hypothyroidism    H/O radioactive iodine thyroid ablation - Follow-up TSH with IV levothyroxine if elevated -Addendum post admission: TSH returned at 58,000.  Patient started on IV levothyroxine -IV levothyroxine 200mg  x1 then 100mg  daiy - Place in stepdown - follow upT4, cortisol level  --Endocrinology consult in the am    Hypertensive urgency - Secondary to noncompliance with dialysis - Labetalol as needed  Fluid overload Non-compliance with home hemodialysis [facility]) - Nephrology consulted and will dialyze in the a.m. - IV Lasix as patient still produces urine    Elevated troponin - Suspect demand ischemia from fluid overload - Continue to trend    Anemia of chronic kidney failure, stage 5 (HCC) - Monitor H&H    Type 2 diabetes mellitus with kidney complication, with long-term current use of insulin  - Sliding scale insulin coverage    Endometrial cancer (HCC) - No acute issues suspected   DVT prophylaxis: Heparin Code Status: full code  Family Communication:  none  Disposition Plan: Back to previous home environment Consults called: Nephrology Status:At the time of admission, it appears that the appropriate admission status for this patient is INPATIENT. This is judged to be reasonable and necessary in order to provide the required intensity of service to ensure the patient's safety given the presenting symptoms, physical exam findings, and initial radiographic and laboratory data in the context of their  Comorbid conditions.   Patient requires inpatient status due to high  intensity of service, high risk for further deterioration and high frequency of surveillance required.   I certify that at the point of admission it is my clinical judgment that the patient will require inpatient hospital care  spanning beyond 2 midnights     Physical Exam: Vitals:   01/14/2022 2015 01/20/2022 2030 01/08/2022 2100 01/07/2022 2230  BP: (!) 194/87 (!) 165/153 (!) 188/88 (!) 169/87  Pulse: 93 92 95   Resp: 17 17    Temp:      TempSrc:      SpO2: 100% 99% 100%   Height:       Constitutional: Alert, , confused . Not in any apparent distress HEENT:      Head: Normocephalic and atraumatic.         Eyes: PERLA, EOMI, Conjunctivae are normal. Sclera is non-icteric.       Mouth/Throat: Mucous membranes are moist.       Neck: Supple with no signs of meningismus. Cardiovascular: Regular rate and rhythm. No murmurs, gallops, or rubs. 2+ symmetrical distal pulses are present . No JVD. No  LE edema Respiratory: Respiratory effort normal .Lungs sounds clear bilaterally. No wheezes, crackles, or rhonchi.  Gastrointestinal: Soft, non tender, non distended. Positive bowel sounds.  Genitourinary: No CVA tenderness. Musculoskeletal: Nontender with normal range of motion in all extremities. No cyanosis, or erythema of extremities. Neurologic:  Face is symmetric. Moving all extremities. No gross focal neurologic deficits . Skin: Skin is warm, dry.  No rash or ulcers Psychiatric: restless    Past Medical History:  Diagnosis Date   Hypertension    Renal disorder     History reviewed. No pertinent surgical history.   has no history on file for tobacco use, alcohol use, and drug use.  No Known Allergies  History reviewed. No pertinent family history.    Prior to Admission medications   Not on File      Labs on Admission: I have personally reviewed following labs and imaging studies  CBC: Recent Labs  Lab 01/07/2022 1857  WBC 11.3*  NEUTROABS 8.9*  HGB 10.9*  HCT 33.7*  MCV 92.1  PLT 431   Basic Metabolic Panel: Recent Labs  Lab 01/14/2022 1857  NA 134*  K 4.4  CL 105  CO2 16*  GLUCOSE 211*  BUN 49*  CREATININE 5.90*  CALCIUM 9.7   GFR: CrCl cannot be calculated (Unknown ideal  weight.). Liver Function Tests: Recent Labs  Lab 01/04/2022 1857  AST 39  ALT 22  ALKPHOS 132*  BILITOT 0.8  PROT 7.6  ALBUMIN 3.5   No results for input(s): LIPASE, AMYLASE in the last 168 hours. No results for input(s): AMMONIA in the last 168 hours. Coagulation Profile: No results for input(s): INR, PROTIME in the last 168 hours. Cardiac Enzymes: No results for input(s): CKTOTAL, CKMB, CKMBINDEX, TROPONINI in the last 168 hours. BNP (last 3 results) No results for input(s): PROBNP in the last 8760 hours. HbA1C: No results for input(s): HGBA1C in the last 72 hours. CBG: No results for input(s): GLUCAP in the last 168 hours. Lipid Profile: No results for input(s): CHOL, HDL, LDLCALC, TRIG, CHOLHDL, LDLDIRECT in the last 72 hours. Thyroid Function Tests: No results for input(s): TSH, T4TOTAL, FREET4, T3FREE, THYROIDAB in the last 72 hours. Anemia Panel: No results for input(s): VITAMINB12, FOLATE, FERRITIN, TIBC, IRON, RETICCTPCT in the last 72 hours. Urine analysis: No results found for: COLORURINE, [place]ARANCEUR, [place], [place], [place], [place], [place], [place], [place], [place], [place], [place]  Exams on Admission: CT Head Wo Contrast  Result Date: 01/18/2022 CLINICAL DATA:  Mental status change, unknown cause EXAM: CT HEAD WITHOUT CONTRAST TECHNIQUE: Contiguous axial images were obtained from the base of the skull through the vertex without intravenous contrast. RADIATION DOSE REDUCTION: This exam was performed according to the departmental dose-optimization program which includes automated exposure control, adjustment of the mA and/or kV according to patient size and/or use of iterative reconstruction technique. COMPARISON:  None. FINDINGS: Brain: No intracranial hemorrhage, mass effect, or midline shift. No hydrocephalus. The basilar cisterns are patent. Mild periventricular and deep white matter hypodensity typical of chronic small vessel  ischemia. No evidence of territorial infarct or acute ischemia. No extra-axial or intracranial fluid collection. Vascular: Atherosclerosis of skullbase vasculature without hyperdense vessel or abnormal calcification. Skull: Sclerotic cortical thickening of the outer table of the right occipital bone, nonaggressive in appearance. Frontal hyperostosis. No fracture or suspicious bone lesion. Sinuses/Orbits: Paranasal sinuses and mastoid air cells are clear. The visualized orbits are unremarkable. Other: None. IMPRESSION: 1. No acute intracranial abnormality. 2. Mild chronic small vessel ischemia. Electronically Signed   By: Keith Rake M.D.   On: 01/12/2022 22:32   DG Chest Port 1 View  Result Date: 01/23/2022 CLINICAL DATA:  Missed dialysis EXAM: PORTABLE CHEST 1 VIEW COMPARISON:  None. FINDINGS: Mild cardiomegaly with vascular congestion. Possible small left effusion. No consolidation or pneumothorax IMPRESSION: Mild cardiomegaly with vascular congestion and possible small left effusion Electronically Signed   By: Donavan Foil M.D.   On: 01/04/2022 20:29       Athena Masse MD [facility]   01/24/2022, 11:00 PM

## 2022-01-18 NOTE — ED Notes (Signed)
Patient is resting comfortably at this time

## 2022-01-18 NOTE — ED Notes (Signed)
Pt very confused, crying out asking for water, talking to her father who is not there. Pt crying, upset and anxious, wanting to get out of bed and leave room. Provider notified.

## 2022-01-18 NOTE — ED Notes (Signed)
Pt in recliner yelling to help her, talking to her "father".

## 2022-01-18 NOTE — ED Notes (Signed)
Pt in bed screaming intermittently to help her, pt checked on and in NAD.

## 2022-01-18 NOTE — ED Notes (Signed)
Brother at bedside 

## 2022-01-19 ENCOUNTER — Encounter: Payer: Self-pay | Admitting: Internal Medicine

## 2022-01-19 DIAGNOSIS — G9341 Metabolic encephalopathy: Secondary | ICD-10-CM | POA: Diagnosis not present

## 2022-01-19 DIAGNOSIS — Z9115 Patient's noncompliance with renal dialysis: Secondary | ICD-10-CM | POA: Diagnosis not present

## 2022-01-19 LAB — CBC WITH DIFFERENTIAL/PLATELET
Abs Immature Granulocytes: 0.06 10*3/uL (ref 0.00–0.07)
Basophils Absolute: 0.1 10*3/uL (ref 0.0–0.1)
Basophils Relative: 0 %
Eosinophils Absolute: 0.1 10*3/uL (ref 0.0–0.5)
Eosinophils Relative: 1 %
HCT: 31.6 % — ABNORMAL LOW (ref 36.0–46.0)
Hemoglobin: 10.2 g/dL — ABNORMAL LOW (ref 12.0–15.0)
Immature Granulocytes: 0 %
Lymphocytes Relative: 6 %
Lymphs Abs: 0.8 10*3/uL (ref 0.7–4.0)
MCH: 29.4 pg (ref 26.0–34.0)
MCHC: 32.3 g/dL (ref 30.0–36.0)
MCV: 91.1 fL (ref 80.0–100.0)
Monocytes Absolute: 0.9 10*3/uL (ref 0.1–1.0)
Monocytes Relative: 7 %
Neutro Abs: 12.1 10*3/uL — ABNORMAL HIGH (ref 1.7–7.7)
Neutrophils Relative %: 86 %
Platelets: 260 10*3/uL (ref 150–400)
RBC: 3.47 MIL/uL — ABNORMAL LOW (ref 3.87–5.11)
RDW: 13.7 % (ref 11.5–15.5)
WBC: 14 10*3/uL — ABNORMAL HIGH (ref 4.0–10.5)
nRBC: 0 % (ref 0.0–0.2)

## 2022-01-19 LAB — COMPREHENSIVE METABOLIC PANEL
ALT: 27 U/L (ref 0–44)
AST: 50 U/L — ABNORMAL HIGH (ref 15–41)
Albumin: 3.3 g/dL — ABNORMAL LOW (ref 3.5–5.0)
Alkaline Phosphatase: 119 U/L (ref 38–126)
Anion gap: 15 (ref 5–15)
BUN: 53 mg/dL — ABNORMAL HIGH (ref 8–23)
CO2: 13 mmol/L — ABNORMAL LOW (ref 22–32)
Calcium: 9.6 mg/dL (ref 8.9–10.3)
Chloride: 110 mmol/L (ref 98–111)
Creatinine, Ser: 6.39 mg/dL — ABNORMAL HIGH (ref 0.44–1.00)
GFR, Estimated: 7 mL/min — ABNORMAL LOW (ref >60)
Glucose, Bld: 188 mg/dL — ABNORMAL HIGH (ref 70–99)
Potassium: 4.7 mmol/L (ref 3.5–5.1)
Sodium: 138 mmol/L (ref 135–145)
Total Bilirubin: 0.8 mg/dL (ref 0.3–1.2)
Total Protein: 7.1 g/dL (ref 6.5–8.1)

## 2022-01-19 LAB — GLUCOSE, CAPILLARY
Glucose-Capillary: 190 mg/dL — ABNORMAL HIGH (ref 70–99)
Glucose-Capillary: 168 mg/dL — ABNORMAL HIGH (ref 70–99)
Glucose-Capillary: 139 mg/dL — ABNORMAL HIGH (ref 70–99)
Glucose-Capillary: 199 mg/dL — ABNORMAL HIGH (ref 70–99)

## 2022-01-19 LAB — TSH: TSH: 66.381 u[IU]/mL — ABNORMAL HIGH (ref 0.350–4.500)

## 2022-01-19 LAB — T4, FREE: Free T4: 0.83 ng/dL (ref 0.61–1.12)

## 2022-01-19 LAB — HEMOGLOBIN A1C
Hgb A1c MFr Bld: 9.2 % — ABNORMAL HIGH (ref 4.8–5.6)
Mean Plasma Glucose: 217 mg/dL

## 2022-01-19 LAB — AMMONIA: Ammonia: 16 umol/L (ref 9–35)

## 2022-01-19 MED ORDER — CHLORHEXIDINE GLUCONATE CLOTH 2 % EX PADS
6.0000 | MEDICATED_PAD | Freq: Every day | CUTANEOUS | Status: DC
  Administered 2022-01-19 – 2022-01-27 (×8): 6 via TOPICAL

## 2022-01-19 MED ORDER — DIAZEPAM 5 MG/ML IJ SOLN: 5.0000 mg | Freq: Four times a day (QID) | INTRAMUSCULAR | Status: DC | PRN

## 2022-01-19 MED ORDER — RISPERIDONE 1 MG PO TBDP
0.5000 mg | ORAL_TABLET | Freq: Three times a day (TID) | ORAL | Status: DC
  Administered 2022-01-20: 0.5 mg via ORAL
  Filled 2022-01-19 (×5): qty 0.5

## 2022-01-19 MED ORDER — HALOPERIDOL LACTATE 5 MG/ML IJ SOLN
2.0000 mg | Freq: Four times a day (QID) | INTRAMUSCULAR | Status: DC | PRN
  Administered 2022-01-19: 2 mg via INTRAVENOUS
  Filled 2022-01-19: qty 1

## 2022-01-19 MED ORDER — DIAZEPAM 5 MG/ML IJ SOLN
5.0000 mg | Freq: Four times a day (QID) | INTRAMUSCULAR | Status: DC | PRN
  Administered 2022-01-19 (×2): 5 mg via INTRAVENOUS
  Filled 2022-01-19 (×2): qty 2

## 2022-01-19 MED ORDER — HALOPERIDOL LACTATE 5 MG/ML IJ SOLN
5.0000 mg | Freq: Once | INTRAMUSCULAR | Status: AC
  Administered 2022-01-19: 5 mg via INTRAMUSCULAR

## 2022-01-19 MED ORDER — HALOPERIDOL LACTATE 5 MG/ML IJ SOLN: 5.0000 mg | Freq: Four times a day (QID) | INTRAMUSCULAR | Status: DC | PRN

## 2022-01-19 NOTE — Progress Notes (Signed)
Spoke with patient husband Cherree Conerly. Completed admission. Provided patient update.

## 2022-01-19 NOTE — ED Notes (Signed)
Pt raptly saying "daddy daddy daddy hurry"

## 2022-01-19 NOTE — Consult Note (Signed)
[facility] Face-to-Face Psychiatry Consult   Reason for Consult: Consult for 66 year old woman with multiple medical problems currently delirious Referring Physician:  Mal Misty Patient Identification: Regina Jackson MRN:  [id number] Principal Diagnosis: Acute metabolic encephalopathy Diagnosis:  Principal Problem:   Acute metabolic encephalopathy Active Problems:   Chronic home hemodialysis status ([place])   Hypertensive urgency   Non-compliance with renal dialysis ([place])   Anemia of chronic kidney failure, stage 5 (HCC)   Elevated troponin   Fluid overload   Involuntary commitment   Type 2 diabetes mellitus with kidney complication, with long-term current use of insulin ([place])   Hypothyroidism   Endometrial cancer ([place])   H/O radioactive iodine thyroid ablation   Total Time spent with patient: 45 minutes  Subjective:   Regina Jackson is a 67 y.o. female patient admitted with patient not able to give any information.  She was admitted to the hospital with altered mental status in the context of failure to keep up with dialysis.  HPI: 66 year old woman brought to the hospital with altered mental status and confusion.  Reportedly had not been compliant with outpatient dialysis.  Patient has been confused with encephalopathy since coming into the hospital.  Came to see the patient this evening found her with a sitter who is struggling to keep the patient in bed.  Patient was rolling over and crawling out of bed but not with any evidence of any intent.  Patient was not able to follow any commands.  Seemed to have no understanding that another person was there were trying to assist her.  Vocalizing only grunting noises.  Staring off into space.  Labs reviewed.  Multiple abnormalities including elevated white count still, elevated BUN and creatinine of course, extremely elevated TSH indicating nontreatment of her thyroid outside the hospital.  Patient has been receiving 25 mg of Seroquel a couple times a day  and there is an order for as needed intramuscular haloperidol and for diazepam.  Past Psychiatric History: No evidence that I see in the chart of past psychiatric history.  No report of previous depression and psychosis or confusion.  No report of substance abuse  Risk to Self:   Risk to Others:   Prior Inpatient Therapy:   Prior Outpatient Therapy:    Past Medical History:  Past Medical History:  Diagnosis Date   Cancer ([place])    Diabetes mellitus without complication ([place])    Hypertension    Hypothyroidism    Renal disorder    Sleep apnea     Past Surgical History:  Procedure Laterality Date   TUMOR REMOVAL     endometrial   Family History: History reviewed. No pertinent family history. Family Psychiatric  History: None reported Social History:  Social History   Substance and Sexual Activity  Alcohol Use Never     Social History   Substance and Sexual Activity  Drug Use Never    Social History   Socioeconomic History   Marital status: Married    Spouse name: Not on file   Number of children: Not on file   Years of education: Not on file   Highest education level: Not on file  Occupational History   Not on file  Tobacco Use   Smoking status: Never    Passive exposure: Never   Smokeless tobacco: Never  Vaping Use   Vaping Use: Never used  Substance and Sexual Activity   Alcohol use: Never   Drug use: Never   Sexual activity: Yes  Other Topics Concern   Not on file  Social History Narrative   Not on file   Social Determinants of Health   Financial Resource Strain: Not on file  Food Insecurity: Not on file  Transportation Needs: Not on file  Physical Activity: Not on file  Stress: Not on file  Social Connections: Not on file   Additional Social History:    Allergies:   Allergies  Allergen Reactions   Amoxicillin    Augmentin [Amoxicillin-Pot Clavulanate]    Ceftin [Cefuroxime]    Compazine [Prochlorperazine]    Demerol [Meperidine Hcl]     Macrobid [Nitrofurantoin]    Phenergan [Promethazine]    Sulfa Antibiotics     Labs:  Results for orders placed or performed during the hospital encounter of 01/18/2022 (from the past 48 hour(s))  Brain natriuretic peptide     Status: Abnormal   Collection Time: 12/28/2021  6:57 PM  Result Value Ref Range   B Natriuretic Peptide 329.4 (H) 0.0 - 100.0 pg/mL    Comment: Performed at [facility], [address] [place],  [place] [address]  Comprehensive metabolic panel     Status: Abnormal   Collection Time: 01/15/2022  6:57 PM  Result Value Ref Range   Sodium 134 (L) 135 - 145 mmol/L   Potassium 4.4 3.5 - 5.1 mmol/L   Chloride 105 98 - 111 mmol/L   CO2 16 (L) 22 - 32 mmol/L   Glucose, Bld 211 (H) 70 - 99 mg/dL    Comment: Glucose reference range applies only to samples taken after fasting for at least 8 hours.   BUN 49 (H) 8 - 23 mg/dL   Creatinine, Ser 5.90 (H) 0.44 - 1.00 mg/dL   Calcium 9.7 8.9 - 10.3 mg/dL   Total Protein 7.6 6.5 - 8.1 g/dL   Albumin 3.5 3.5 - 5.0 g/dL   AST 39 15 - 41 U/L   ALT 22 0 - 44 U/L   Alkaline Phosphatase 132 (H) 38 - 126 U/L   Total Bilirubin 0.8 0.3 - 1.2 mg/dL   GFR, Estimated 8 (L) >60 mL/min    Comment: (NOTE) Calculated using the CKD-EPI Creatinine Equation (2021)    Anion gap 13 5 - 15    Comment: Performed at [facility] [place], [place] [address]  Troponin I (High Sensitivity)     Status: Abnormal   Collection Time: 01/06/2022  6:57 PM  Result Value Ref Range   Troponin I (High Sensitivity) 42 (H) <18 ng/L    Comment: (NOTE) Elevated high sensitivity troponin I (hsTnI) values and significant  changes across serial measurements may suggest ACS but many other  chronic and acute conditions are known to elevate hsTnI results.  Refer to the "Links" section for chest pain algorithms and additional  guidance. Performed at [facility], [address] [place], [place] [address]   CBC with  Differential     Status: Abnormal   Collection Time: 12/25/2021  6:57 PM  Result Value Ref Range   WBC 11.3 (H) 4.0 - 10.5 K/uL   RBC 3.66 (L) 3.87 - 5.11 MIL/uL   Hemoglobin 10.9 (L) 12.0 - 15.0 g/dL   HCT 33.7 (L) 36.0 - 46.0 %   MCV 92.1 80.0 - 100.0 fL   MCH 29.8 26.0 - 34.0 pg   MCHC 32.3 30.0 - 36.0 g/dL   RDW 13.6 11.5 - 15.5 %   Platelets 281 150 - 400 K/uL   nRBC 0.0  0.0 - 0.2 %   Neutrophils Relative % 80 %   Neutro Abs 8.9 (H) 1.7 - 7.7 K/uL   Lymphocytes Relative 10 %   Lymphs Abs 1.1 0.7 - 4.0 K/uL   Monocytes Relative 7 %   Monocytes Absolute 0.8 0.1 - 1.0 K/uL   Eosinophils Relative 3 %   Eosinophils Absolute 0.3 0.0 - 0.5 K/uL   Basophils Relative 0 %   Basophils Absolute 0.1 0.0 - 0.1 K/uL   Immature Granulocytes 0 %   Abs Immature Granulocytes 0.04 0.00 - 0.07 K/uL    Comment: Performed at [facility], [address] [place], [place] [address]  Urinalysis, Routine w reflex microscopic Urine, Clean Catch     Status: Abnormal   Collection Time: 12/27/2021  8:08 PM  Result Value Ref Range   Color, Urine YELLOW YELLOW   APPearance CLEAR CLEAR   Specific Gravity, Urine 1.020 1.005 - 1.030   pH 5.0 5.0 - 8.0   Glucose, UA 100 (A) NEGATIVE mg/dL   Hgb urine dipstick MODERATE (A) NEGATIVE   Bilirubin Urine NEGATIVE NEGATIVE   Ketones, ur 15 (A) NEGATIVE mg/dL   Protein, ur >300 (A) NEGATIVE mg/dL   Nitrite NEGATIVE NEGATIVE   Leukocytes,Ua NEGATIVE NEGATIVE    Comment: Performed at [facility], [address] [place], [place] [address]  TSH     Status: Abnormal   Collection Time: 01/24/2022  8:08 PM  Result Value Ref Range   TSH 58.812 (H) 0.350 - 4.500 uIU/mL    Comment: Performed by a 3rd Generation assay with a functional sensitivity of <=0.01 uIU/mL. Performed at [facility], [address] [place], [place] [address]   Urinalysis, Microscopic (reflex)     Status: Abnormal   Collection Time: 01/04/2022  8:08 PM  Result Value  Ref Range   RBC / HPF 0-5 0 - 5 RBC/hpf   WBC, UA 6-10 0 - 5 WBC/hpf   Bacteria, UA RARE (A) NONE SEEN   Squamous Epithelial / LPF 0-5 0 - 5   Mucus PRESENT     Comment: Performed at [facility], [address] [place], [place] [address]  Blood gas, venous     Status: Abnormal   Collection Time: 12/28/2021  8:30 PM  Result Value Ref Range   pH, Ven 7.30 7.250 - 7.430   pCO2, Ven 33 (L) 44.0 - 60.0 mmHg   pO2, Ven 54.0 (H) 32.0 - 45.0 mmHg   Bicarbonate 16.2 (L) 20.0 - 28.0 mmol/L   Acid-base deficit 9.3 (H) 0.0 - 2.0 mmol/L   O2 Saturation 83.8 %   Patient temperature 37.0    Collection site VEIN    Sample type VENOUS     Comment: Performed at [facility], [address] [place],  [address]  Resp Panel by RT-PCR (Flu A&B, Covid) Nasopharyngeal Swab     Status: None   Collection Time: 01/02/2022  8:30 PM   Specimen: Nasopharyngeal Swab; Nasopharyngeal(NP) swabs in vial transport medium  Result Value Ref Range   SARS Coronavirus 2 by RT PCR NEGATIVE NEGATIVE    Comment: (NOTE) SARS-CoV-2 target nucleic acids are NOT DETECTED.  The SARS-CoV-2 RNA is generally detectable in upper respiratory specimens during the acute phase of infection. The lowest concentration of SARS-CoV-2 viral copies this assay can detect is 138 copies/mL. A negative result does not preclude SARS-Cov-2 infection and should not be used as the sole basis for treatment or other patient management  decisions. A negative result may occur with  improper specimen collection/handling, submission of specimen other than nasopharyngeal swab, presence of viral mutation(s) within the areas targeted by this assay, and inadequate number of viral copies(<138 copies/mL). A negative result must be combined with clinical observations, patient history, and epidemiological information. The expected result is Negative.  Fact Sheet for Patients:  EntrepreneurPulse.com.au  Fact Sheet  for Healthcare Providers:  IncredibleEmployment.be  This test is no t yet approved or cleared by the [place] FDA and  has been authorized for detection and/or diagnosis of SARS-CoV-2 by FDA under an Emergency Use Authorization (EUA). This EUA will remain  in effect (meaning this test can be used) for the duration of the COVID-19 declaration under Section 564(b)(1) of the Act, 21 U.S.C.section 360bbb-3(b)(1), unless the authorization is terminated  or revoked sooner.       Influenza A by PCR NEGATIVE NEGATIVE   Influenza B by PCR NEGATIVE NEGATIVE    Comment: (NOTE) The Xpert Xpress SARS-CoV-2/FLU/RSV plus assay is intended as an aid in the diagnosis of influenza from Nasopharyngeal swab specimens and should not be used as a sole basis for treatment. Nasal washings and aspirates are unacceptable for Xpert Xpress SARS-CoV-2/FLU/RSV testing.  Fact Sheet for Patients: EntrepreneurPulse.com.au  Fact Sheet for Healthcare Providers: IncredibleEmployment.be  This test is not yet approved or cleared by the [place] FDA and has been authorized for detection and/or diagnosis of SARS-CoV-2 by FDA under an Emergency Use Authorization (EUA). This EUA will remain in effect (meaning this test can be used) for the duration of the COVID-19 declaration under Section 564(b)(1) of the Act, 21 U.S.C. section 360bbb-3(b)(1), unless the authorization is terminated or revoked.  Performed at [facility], [place], [place] [address]   Troponin I (High Sensitivity)     Status: Abnormal   Collection Time: 12/27/2021 10:34 PM  Result Value Ref Range   Troponin I (High Sensitivity) 43 (H) <18 ng/L    Comment: (NOTE) Elevated high sensitivity troponin I (hsTnI) values and significant  changes across serial measurements may suggest ACS but many other  chronic and acute conditions are known to elevate hsTnI results.   Refer to the "Links" section for chest pain algorithms and additional  guidance. Performed at [facility], [address] [place], [place] [address]   CBG monitoring, ED     Status: Abnormal   Collection Time: 12/27/2021 11:19 PM  Result Value Ref Range   Glucose-Capillary 215 (H) 70 - 99 mg/dL    Comment: Glucose reference range applies only to samples taken after fasting for at least 8 hours.  HIV Antibody (routine testing w rflx)     Status: None   Collection Time: 01/18/22  6:25 AM  Result Value Ref Range   HIV Screen 4th Generation wRfx Non Reactive Non Reactive    Comment: Performed at [facility], [address] [place], [place] [address]  Hemoglobin A1c     Status: Abnormal   Collection Time: 01/18/22  6:25 AM  Result Value Ref Range   Hgb A1c MFr Bld 9.2 (H) 4.8 - 5.6 %    Comment: (NOTE)         Prediabetes: 5.7 - 6.4         Diabetes: >6.4         Glycemic control for adults with diabetes: <7.0    Mean Plasma Glucose 217 mg/dL    Comment: (NOTE) Performed At: [facility]  [place], [place] [id number] Rush Farmer MD RK:[id number]   T4, free     Status: None   Collection Time: 01/18/22  6:25 AM  Result Value Ref Range   Free T4 0.87 0.61 - 1.12 ng/dL    Comment: (NOTE) Biotin ingestion may interfere with free T4 tests. If the results are inconsistent with the TSH level, previous test results, or the clinical presentation, then consider biotin interference. If needed, order repeat testing after stopping biotin. Performed at [facility], [address] [place], [place] [address]   Cortisol, Random     Status: None   Collection Time: 01/18/22  6:25 AM  Result Value Ref Range   Cortisol, Plasma 22.7 ug/dL    Comment: (NOTE) AM    6.7 - 22.6 ug/dL PM   <10.0       ug/dL Performed at [facility] [address] [place], [place] [address]   CBG monitoring, ED     Status: Abnormal   Collection Time:  01/18/22  7:39 AM  Result Value Ref Range   Glucose-Capillary 225 (H) 70 - 99 mg/dL    Comment: Glucose reference range applies only to samples taken after fasting for at least 8 hours.   Comment 1 Document in Chart   CBG monitoring, ED     Status: Abnormal   Collection Time: 01/18/22 11:26 AM  Result Value Ref Range   Glucose-Capillary 178 (H) 70 - 99 mg/dL    Comment: Glucose reference range applies only to samples taken after fasting for at least 8 hours.  CBG monitoring, ED     Status: Abnormal   Collection Time: 01/18/22  4:18 PM  Result Value Ref Range   Glucose-Capillary 183 (H) 70 - 99 mg/dL    Comment: Glucose reference range applies only to samples taken after fasting for at least 8 hours.  CBG monitoring, ED     Status: Abnormal   Collection Time: 01/18/22  9:22 PM  Result Value Ref Range   Glucose-Capillary 215 (H) 70 - 99 mg/dL    Comment: Glucose reference range applies only to samples taken after fasting for at least 8 hours.  Glucose, capillary     Status: Abnormal   Collection Time: 01/19/22  8:26 AM  Result Value Ref Range   Glucose-Capillary 190 (H) 70 - 99 mg/dL    Comment: Glucose reference range applies only to samples taken after fasting for at least 8 hours.  TSH     Status: Abnormal   Collection Time: 01/19/22  9:18 AM  Result Value Ref Range   TSH 66.381 (H) 0.350 - 4.500 uIU/mL    Comment: Performed by a 3rd Generation assay with a functional sensitivity of <=0.01 uIU/mL. Performed at [facility], [address] [place], [place] [address]   T4, free     Status: None   Collection Time: 01/19/22  9:18 AM  Result Value Ref Range   Free T4 0.83 0.61 - 1.12 ng/dL    Comment: (NOTE) Biotin ingestion may interfere with free T4 tests. If the results are inconsistent with the TSH level, previous test results, or the clinical presentation, then consider biotin interference. If needed, order repeat testing after stopping biotin. Performed at  [facility], [address] [place], [place] [address]   CBC with Differential/Platelet     Status: Abnormal   Collection Time: 01/19/22  9:18 AM  Result Value Ref Range   WBC 14.0 (H) 4.0 -  10.5 K/uL   RBC 3.47 (L) 3.87 - 5.11 MIL/uL   Hemoglobin 10.2 (L) 12.0 - 15.0 g/dL   HCT 31.6 (L) 36.0 - 46.0 %   MCV 91.1 80.0 - 100.0 fL   MCH 29.4 26.0 - 34.0 pg   MCHC 32.3 30.0 - 36.0 g/dL   RDW 13.7 11.5 - 15.5 %   Platelets 260 150 - 400 K/uL   nRBC 0.0 0.0 - 0.2 %   Neutrophils Relative % 86 %   Neutro Abs 12.1 (H) 1.7 - 7.7 K/uL   Lymphocytes Relative 6 %   Lymphs Abs 0.8 0.7 - 4.0 K/uL   Monocytes Relative 7 %   Monocytes Absolute 0.9 0.1 - 1.0 K/uL   Eosinophils Relative 1 %   Eosinophils Absolute 0.1 0.0 - 0.5 K/uL   Basophils Relative 0 %   Basophils Absolute 0.1 0.0 - 0.1 K/uL   Immature Granulocytes 0 %   Abs Immature Granulocytes 0.06 0.00 - 0.07 K/uL    Comment: Performed at [facility], [address] [place], [place] [address]  Comprehensive metabolic panel     Status: Abnormal   Collection Time: 01/19/22  9:18 AM  Result Value Ref Range   Sodium 138 135 - 145 mmol/L   Potassium 4.7 3.5 - 5.1 mmol/L   Chloride 110 98 - 111 mmol/L   CO2 13 (L) 22 - 32 mmol/L   Glucose, Bld 188 (H) 70 - 99 mg/dL    Comment: Glucose reference range applies only to samples taken after fasting for at least 8 hours.   BUN 53 (H) 8 - 23 mg/dL   Creatinine, Ser 6.39 (H) 0.44 - 1.00 mg/dL   Calcium 9.6 8.9 - 10.3 mg/dL   Total Protein 7.1 6.5 - 8.1 g/dL   Albumin 3.3 (L) 3.5 - 5.0 g/dL   AST 50 (H) 15 - 41 U/L   ALT 27 0 - 44 U/L   Alkaline Phosphatase 119 38 - 126 U/L   Total Bilirubin 0.8 0.3 - 1.2 mg/dL   GFR, Estimated 7 (L) >60 mL/min    Comment: (NOTE) Calculated using the CKD-EPI Creatinine Equation (2021)    Anion gap 15 5 - 15    Comment: Performed at [facility], [address] [place], [place] [address]  Ammonia     Status: None    Collection Time: 01/19/22  9:18 AM  Result Value Ref Range   Ammonia 16 9 - 35 umol/L    Comment: Performed at [facility], [address] [place], [place] [address]  Glucose, capillary     Status: Abnormal   Collection Time: 01/19/22 11:47 AM  Result Value Ref Range   Glucose-Capillary 168 (H) 70 - 99 mg/dL    Comment: Glucose reference range applies only to samples taken after fasting for at least 8 hours.  Glucose, capillary     Status: Abnormal   Collection Time: 01/19/22  4:15 PM  Result Value Ref Range   Glucose-Capillary 139 (H) 70 - 99 mg/dL    Comment: Glucose reference range applies only to samples taken after fasting for at least 8 hours.    Current Facility-Administered Medications  Medication Dose Route Frequency Provider Last Rate Last Admin   acetaminophen (TYLENOL) tablet 650 mg  650 mg Oral Q6H PRN Athena Masse, MD       Or   acetaminophen (TYLENOL) suppository 650 mg  650 mg Rectal Q6H PRN Athena Masse, MD  amLODipine (NORVASC) tablet 10 mg  10 mg Oral Daily Jennye Boroughs, MD   10 mg at 01/19/22 8144   Chlorhexidine Gluconate Cloth 2 % PADS 6 each  6 each Topical Q0600 Colon Flattery, NP   6 each at 01/19/22 0935   diazepam (VALIUM) injection 5 mg  5 mg Intravenous Q6H PRN Jennye Boroughs, MD   5 mg at 01/19/22 1151   haloperidol lactate (HALDOL) injection 2 mg  2 mg Intravenous Q6H PRN Dymond Spreen, Madie Reno, MD       haloperidol lactate (HALDOL) injection 5 mg  5 mg Intramuscular Q6H PRN Kacyn Souder, Madie Reno, MD   5 mg at 01/19/22 1344   heparin injection 5,000 Units  5,000 Units Subcutaneous Q8H Athena Masse, MD   5,000 Units at 01/19/22 1344   hydrALAZINE (APRESOLINE) tablet 25 mg  25 mg Oral Q8H Jennye Boroughs, MD   25 mg at 01/19/22 1344   insulin aspart (novoLOG) injection 0-5 Units  0-5 Units Subcutaneous QHS Judd Gaudier V, MD   2 Units at 01/18/22 2126   insulin aspart (novoLOG) injection 0-6 Units  0-6 Units Subcutaneous TID WC Athena Masse, MD   1 Units at 01/19/22 1152   labetalol (NORMODYNE) injection 10 mg  10 mg Intravenous Q2H PRN Athena Masse, MD   10 mg at 01/18/22 1033   levothyroxine (SYNTHROID, LEVOTHROID) injection 100 mcg  100 mcg Intravenous Daily Judd Gaudier V, MD   100 mcg at 01/19/22 0935   ondansetron (ZOFRAN) tablet 4 mg  4 mg Oral Q6H PRN Athena Masse, MD       Or   ondansetron [facility]) injection 4 mg  4 mg Intravenous Q6H PRN Athena Masse, MD       risperiDONE (RISPERDAL M-TABS) disintegrating tablet 0.5 mg  0.5 mg Oral TID Jennett Tarbell, Madie Reno, MD       rOPINIRole (REQUIP) tablet 0.25 mg  0.25 mg Oral QHS Jennye Boroughs, MD   0.25 mg at 01/18/22 2159    Musculoskeletal: Strength & Muscle Tone: within normal limits Gait & Station: unable to stand Patient leans: N/A            Psychiatric Specialty Exam:  Presentation  General Appearance: No data recorded Eye Contact:No data recorded Speech:No data recorded Speech Volume:No data recorded Handedness:No data recorded  Mood and Affect  Mood:No data recorded Affect:No data recorded  Thought Process  Thought Processes:No data recorded Descriptions of Associations:No data recorded Orientation:No data recorded Thought Content:No data recorded History of Schizophrenia/Schizoaffective disorder:No data recorded Duration of Psychotic Symptoms:No data recorded Hallucinations:No data recorded Ideas of Reference:No data recorded Suicidal Thoughts:No data recorded Homicidal Thoughts:No data recorded  Sensorium  Memory:No data recorded Judgment:No data recorded Insight:No data recorded  Executive Functions  Concentration:No data recorded Attention Span:No data recorded Recall:No data recorded Fund of Knowledge:No data recorded Language:No data recorded  Psychomotor Activity  Psychomotor Activity:No data recorded  Assets  Assets:No data recorded  Sleep  Sleep:No data recorded  Physical Exam: Physical Exam Vitals and  nursing note reviewed.  Constitutional:      Appearance: She is obese. She is ill-appearing.  HENT:     Head: Normocephalic and atraumatic.     Mouth/Throat:     Pharynx: Oropharynx is clear.  Eyes:     Pupils: Pupils are equal, round, and reactive to light.  Cardiovascular:     Rate and Rhythm: Normal rate and regular rhythm.  Pulmonary:     Effort: Pulmonary  effort is normal.     Breath sounds: Normal breath sounds.  Abdominal:     General: Abdomen is flat.     Palpations: Abdomen is soft.  Musculoskeletal:        General: Normal range of motion.  Skin:    General: Skin is warm and dry.  Neurological:     General: No focal deficit present.     Mental Status: Mental status is at baseline.  Psychiatric:        Attention and Perception: She is inattentive.        Speech: She is noncommunicative.        Cognition and Memory: Cognition is impaired.   Review of Systems  Unable to perform ROS: Mental status change  Blood pressure 139/80, pulse (!) 103, temperature 97.7 F (36.5 C), temperature source Oral, resp. rate 18, height 5' 7.5" (1.715 m), SpO2 90 %. There is no height or weight on file to calculate BMI.  Treatment Plan Summary: Medication management and Plan 66 year old woman with multiple medical problems with multiple reasons to be delirious.  Unquestionably having metabolic abnormalities due to failure to keep up with dialysis.  Also possible contribution of what looks like severe hypothyroidism.  Patient remains agitated at this time.  Head CT unrevealing.  Goal of treatment would be to relieve all possible medical conditions that could be contributing while keeping the patient safe.  She already has a Actuary.  I made a couple alterations to the medicine.  Discontinued Seroquel and preference for Risperdal M tabs half milligram 3 times a day which I think are probably more likely to be helpful with the acute agitation and behavior.  Also changed Haloperidol order to allow  for IV administration.  It was only ordered as IM and I am not sure if that is because she did not have an IV in place at some point but this way both options are available and the IV is generally much quicker and more effective.  In general we try to avoid benzodiazepines for delirium however if it proves to be particularly helpful in controlling her dangerous behavior empirically then that is fine.  Patient appears to be at high risk of rolling out of bed even with a sitter there because of her large weight and difficulty controlling her.  Might benefit from some way to put higher railings on the bedsore might even at some point benefit from mittens given that she was using her hands to try and twist herself out of the bed.  No indication for psychiatric hospitalization.  We will follow as needed.  Disposition: Patient does not meet criteria for psychiatric inpatient admission.  Alethia Berthold, MD 01/19/2022 6:41 PM

## 2022-01-19 NOTE — Progress Notes (Signed)
Patient is very agitated. Hallucinating. Verbal disturbances. Haldol was given at 730A, does not seem to have touched patient. Patient is scheduled to go to dialysis. Patient will not be able to complete treatment in this state. MD Ayiku alerted.

## 2022-01-19 NOTE — Progress Notes (Signed)
[facility], [place] 01/19/22  Subjective:   LOS: 2  Patient known to our practice from home hemodialysis.  She is admitted for altered mental status. Today, she was seen prior to starting dialysis.  She was very agitated.  A sitter was available but patient would not keep her mittens on.  She was moving around quite a bit.  She received sedative prior to arriving to dialysis but patient continues to remain agitated.    Objective:  Vital signs in last 24 hours:  Pulse Rate:  [95-106] 103 (01/26 1500) Resp:  [17-25] 18 (01/26 1500) BP: (135-168)/(75-116) 139/80 (01/26 1500) SpO2:  [90 %-99 %] 90 % (01/26 1500)  Weight change:  There were no vitals filed for this visit.  Intake/Output:    Intake/Output Summary (Last 24 hours) at 01/19/2022 1808 Last data filed at 01/19/2022 0950 Gross per 24 hour  Intake 140 ml  Output --  Net 140 ml     Physical Exam: General: Agitated,  HEENT Mouth appears dry  Pulm/lungs Room air, normal breathing effort  CVS/Heart regular, no rub  Abdomen:  Soft, nontender  Extremities: No peripheral edema  Neurologic: Agitated, not able to follow commands  Skin: Warm, dry  Access: Left arm AV fistula       Basic Metabolic Panel:  Recent Labs  Lab 01/24/2022 1857 01/19/22 0918  NA 134* 138  K 4.4 4.7  CL 105 110  CO2 16* 13*  GLUCOSE 211* 188*  BUN 49* 53*  CREATININE 5.90* 6.39*  CALCIUM 9.7 9.6     CBC: Recent Labs  Lab 01/19/2022 1857 01/19/22 0918  WBC 11.3* 14.0*  NEUTROABS 8.9* 12.1*  HGB 10.9* 10.2*  HCT 33.7* 31.6*  MCV 92.1 91.1  PLT 281 260     No results found for: HEPBSAG, HEPBSAB, HEPBIGM    Microbiology:  Recent Results (from the past 240 hour(s))  Resp Panel by RT-PCR (Flu A&B, Covid) Nasopharyngeal Swab     Status: None   Collection Time: 01/23/2022  8:30 PM   Specimen: Nasopharyngeal Swab; Nasopharyngeal(NP) swabs in vial transport medium  Result Value Ref Range Status   SARS  Coronavirus 2 by RT PCR NEGATIVE NEGATIVE Final    Comment: (NOTE) SARS-CoV-2 target nucleic acids are NOT DETECTED.  The SARS-CoV-2 RNA is generally detectable in upper respiratory specimens during the acute phase of infection. The lowest concentration of SARS-CoV-2 viral copies this assay can detect is 138 copies/mL. A negative result does not preclude SARS-Cov-2 infection and should not be used as the sole basis for treatment or other patient management decisions. A negative result may occur with  improper specimen collection/handling, submission of specimen other than nasopharyngeal swab, presence of viral mutation(s) within the areas targeted by this assay, and inadequate number of viral copies(<138 copies/mL). A negative result must be combined with clinical observations, patient history, and epidemiological information. The expected result is Negative.  Fact Sheet for Patients:  EntrepreneurPulse.com.au  Fact Sheet for Healthcare Providers:  IncredibleEmployment.be  This test is no t yet approved or cleared by the [place] FDA and  has been authorized for detection and/or diagnosis of SARS-CoV-2 by FDA under an Emergency Use Authorization (EUA). This EUA will remain  in effect (meaning this test can be used) for the duration of the COVID-19 declaration under Section 564(b)(1) of the Act, 21 U.S.C.section 360bbb-3(b)(1), unless the authorization is terminated  or revoked sooner.       Influenza A by PCR NEGATIVE NEGATIVE  Final   Influenza B by PCR NEGATIVE NEGATIVE Final    Comment: (NOTE) The Xpert Xpress SARS-CoV-2/FLU/RSV plus assay is intended as an aid in the diagnosis of influenza from Nasopharyngeal swab specimens and should not be used as a sole basis for treatment. Nasal washings and aspirates are unacceptable for Xpert Xpress SARS-CoV-2/FLU/RSV testing.  Fact Sheet for  Patients: EntrepreneurPulse.com.au  Fact Sheet for Healthcare Providers: IncredibleEmployment.be  This test is not yet approved or cleared by the [place] FDA and has been authorized for detection and/or diagnosis of SARS-CoV-2 by FDA under an Emergency Use Authorization (EUA). This EUA will remain in effect (meaning this test can be used) for the duration of the COVID-19 declaration under Section 564(b)(1) of the Act, 21 U.S.C. section 360bbb-3(b)(1), unless the authorization is terminated or revoked.  Performed at [facility], [address] [place], [place] [address]     Coagulation Studies: No results for input(s): LABPROT, INR in the last 72 hours.  Urinalysis: Recent Labs    01/16/2022 2008  COLORURINE YELLOW  LABSPEC 1.020  PHURINE 5.0  GLUCOSEU 100*  HGBUR MODERATE*  BILIRUBINUR NEGATIVE  KETONESUR 15*  PROTEINUR >300*  NITRITE NEGATIVE  LEUKOCYTESUR NEGATIVE      Imaging: CT Head Wo Contrast  Result Date: 01/16/2022 CLINICAL DATA:  Mental status change, unknown cause EXAM: CT HEAD WITHOUT CONTRAST TECHNIQUE: Contiguous axial images were obtained from the base of the skull through the vertex without intravenous contrast. RADIATION DOSE REDUCTION: This exam was performed according to the departmental dose-optimization program which includes automated exposure control, adjustment of the mA and/or kV according to patient size and/or use of iterative reconstruction technique. COMPARISON:  None. FINDINGS: Brain: No intracranial hemorrhage, mass effect, or midline shift. No hydrocephalus. The basilar cisterns are patent. Mild periventricular and deep white matter hypodensity typical of chronic small vessel ischemia. No evidence of territorial infarct or acute ischemia. No extra-axial or intracranial fluid collection. Vascular: Atherosclerosis of skullbase vasculature without hyperdense vessel or abnormal calcification.  Skull: Sclerotic cortical thickening of the outer table of the right occipital bone, nonaggressive in appearance. Frontal hyperostosis. No fracture or suspicious bone lesion. Sinuses/Orbits: Paranasal sinuses and mastoid air cells are clear. The visualized orbits are unremarkable. Other: None. IMPRESSION: 1. No acute intracranial abnormality. 2. Mild chronic small vessel ischemia. Electronically Signed   By: Keith Rake M.D.   On: 01/16/2022 22:32   DG Chest Port 1 View  Result Date: 01/04/2022 CLINICAL DATA:  Missed dialysis EXAM: PORTABLE CHEST 1 VIEW COMPARISON:  None. FINDINGS: Mild cardiomegaly with vascular congestion. Possible small left effusion. No consolidation or pneumothorax IMPRESSION: Mild cardiomegaly with vascular congestion and possible small left effusion Electronically Signed   By: Donavan Foil M.D.   On: 12/28/2021 20:29     Medications:     amLODipine  10 mg Oral Daily   Chlorhexidine Gluconate Cloth  6 each Topical Q0600   heparin  5,000 Units Subcutaneous Q8H   hydrALAZINE  25 mg Oral Q8H   insulin aspart  0-5 Units Subcutaneous QHS   insulin aspart  0-6 Units Subcutaneous TID WC   levothyroxine  100 mcg Intravenous Daily   QUEtiapine  25 mg Oral BID   rOPINIRole  0.25 mg Oral QHS   acetaminophen **OR** acetaminophen, diazepam, haloperidol lactate, labetalol, ondansetron **OR** ondansetron (ZOFRAN) IV  Assessment/ Plan:  66 y.o. female with medical problems of  End-stage renal disease, on dialysis since 2015, hypothyroidism, hyperlipidemia, diabetes, peripheral neuropathy, morbid obesity, mono,  gammopathy, ischemic optic neuropathy, regarding oral thrush, COVID-19 in September 2022 history of endometrial carcinoma, obstructive sleep apnea was admitted on 01/10/2022 for  Principal Problem:   Acute metabolic encephalopathy Active Problems:   Chronic home hemodialysis status ([place])   Hypertensive urgency   Non-compliance with renal dialysis ([place])   Anemia of  chronic kidney failure, stage 5 (H[facility])   Elevated troponin   Fluid overload   Involuntary commitment   Type 2 diabetes mellitus with kidney complication, with long-term current use of insulin (HCC)   Hypothyroidism   Endometrial cancer ([place])   H/O radioactive iodine thyroid ablation  Acute metabolic encephalopathy [X32.44]  #. ESRD Patient very agitated in the dialysis unit.  Not safe for dialysis today Potassium is within normal range Does not appear to be volume overload Will try again tomorrow  #. Anemia of CKD  Lab Results  Component Value Date   HGB 10.2 (L) 01/19/2022   Low dose EPO with HD  #. Secondary hyperparathyroidism of renal origin N 25.81   No results found for: PTH No results found for: PHOS Monitor calcium and phos level during this admission   #. Diabetes type 2 with CKD Hgb A1c MFr Bld (%)  Date Value  01/18/2022 9.2 (H)  Plan as hospitalist team   LOS: [place] 1/26/20236:08 PM  [place], [place]

## 2022-01-19 NOTE — Progress Notes (Signed)
Progress Note    [name]  WNI:[id number] DOB: 1956/09/20  DOA: 01/08/2022 PCP: System, Provider Not In      Brief Narrative:    Medical records reviewed and are as summarized below:  Regina Jackson is a 66 y.o. female with medical history significant for ESRD on hemodialysis, medical nonadherence with dialysis, anemia of chronic disease, endometrial cancer, hypothyroidism, radioactive iodine, hypertension, diabetes mellitus.  She was brought to the hospital because of altered mental status and increasing agitation.  She was placed under IVC.      Assessment/Plan:   Principal Problem:   Acute metabolic encephalopathy Active Problems:   Chronic home hemodialysis status (HCC)   Hypertensive urgency   Non-compliance with renal dialysis (HCC)   Anemia of chronic kidney failure, stage 5 (HCC)   Elevated troponin   Fluid overload   Involuntary commitment   Type 2 diabetes mellitus with kidney complication, with long-term current use of insulin (HCC)   Hypothyroidism   Endometrial cancer ([place])   H/O radioactive iodine thyroid ablation    Acute metabolic encephalopathy with agitation requiring involuntary commitment: Continue Seroquel and Haldol injection as needed. Patient was taken to the dialysis unit but dialysis could not be initiated because of severe agitation.  Consulted Dr. Mortimer Fries, intensivist, to assist with sedation.  He recommended IV Valium for now.  Consulted Dr. Weber Cooks, psychiatrist, for further evaluation agitation.  ? encephalopathy/emergency: Unknown if elevated blood pressure is contributing to change in mental status.  Continue amlodipine and hydralazine.  Use IV labetalol as needed for severe hypertension.  Hypothyroidism: TSH was 58.812 but free T4 was normal at 0.87.  Repeat TSH was 66.381 and FT4 was 0.83.  FT3 is pending.  Continue IV levothyroxine.  ESRD: Patient could not have hemodialysis today because of severe agitation.  Dialysis has been  postponed until adequate sedation can be achieved.  Elevated troponin: This is likely from demand ischemia  Type II DM with hyperglycemia: Use NovoLog as needed  History of medical nonadherence, endometrial cancer  Unable to reach her husband by phone.  Left a voice message for him to call back.  Diet Order             Diet heart healthy/carb modified Room service appropriate? Yes; Fluid consistency: Thin  Diet effective now                      Consultants: Nephrologist  Procedures: None    Medications:    amLODipine  10 mg Oral Daily   Chlorhexidine Gluconate Cloth  6 each Topical Q0600   heparin  5,000 Units Subcutaneous Q8H   hydrALAZINE  25 mg Oral Q8H   insulin aspart  0-5 Units Subcutaneous QHS   insulin aspart  0-6 Units Subcutaneous TID WC   levothyroxine  100 mcg Intravenous Daily   QUEtiapine  25 mg Oral BID   rOPINIRole  0.25 mg Oral QHS   Continuous Infusions:   Anti-infectives (From admission, onward)    None              Family Communication/Anticipated D/C date and plan/Code Status   DVT prophylaxis: heparin injection 5,000 Units Start: 01/22/2022 2315     Code Status: Full Code  Family Communication: I called her husband at [phone] but there was no response.  I left a voice message for him to call back. Disposition Plan: Plan to discharge home when medically stable   Status is: Inpatient  Remains inpatient appropriate because: Altered mental status           Subjective:   Interval events noted.  She is still confused and very agitated and unable to provide any history.  There was a sitter at the bedside.  Objective:    Vitals:   01/19/22 0550 01/19/22 0730 01/19/22 0818 01/19/22 1028  BP: (!) 138/103 (!) 167/98 (!) 160/75   Pulse: 97 96 (!) 106   Resp: (!) 25 (!) 22 18 (!) 23  Temp:      TempSrc:      SpO2: 98% 98% 99%   Height:       No data found.  No intake or output data in the 24 hours  ending 01/19/22 1314 There were no vitals filed for this visit.  Exam:  GEN: NAD SKIN: Warm and dry EYES: No pallor or icterus ENT: MMM CV: RRR PULM: CTA B ABD: soft, obese, NT, +BS CNS: Alert.  She is very confused.  She moves all extremities appropriately. EXT: No edema or tenderness PSYCH: Very agitated       Data Reviewed:   I have personally reviewed following labs and imaging studies:  Labs: Labs show the following:   Basic Metabolic Panel: Recent Labs  Lab 01/19/2022 1857 01/19/22 0918  NA 134* 138  K 4.4 4.7  CL 105 110  CO2 16* 13*  GLUCOSE 211* 188*  BUN 49* 53*  CREATININE 5.90* 6.39*  CALCIUM 9.7 9.6   GFR CrCl cannot be calculated (Unknown ideal weight.). Liver Function Tests: Recent Labs  Lab 01/12/2022 1857 01/19/22 0918  AST 39 50*  ALT 22 27  ALKPHOS 132* 119  BILITOT 0.8 0.8  PROT 7.6 7.1  ALBUMIN 3.5 3.3*   No results for input(s): LIPASE, AMYLASE in the last 168 hours. Recent Labs  Lab 01/19/22 0918  AMMONIA 16   Coagulation profile No results for input(s): INR, PROTIME in the last 168 hours.  CBC: Recent Labs  Lab 01/09/2022 1857 01/19/22 0918  WBC 11.3* 14.0*  NEUTROABS 8.9* 12.1*  HGB 10.9* 10.2*  HCT 33.7* 31.6*  MCV 92.1 91.1  PLT 281 260   Cardiac Enzymes: No results for input(s): CKTOTAL, CKMB, CKMBINDEX, TROPONINI in the last 168 hours. BNP (last 3 results) No results for input(s): PROBNP in the last 8760 hours. CBG: Recent Labs  Lab 01/18/22 1126 01/18/22 1618 01/18/22 2122 01/19/22 0826 01/19/22 1147  GLUCAP 178* 183* 215* 190* 168*   D-Dimer: No results for input(s): DDIMER in the last 72 hours. Hgb A1c: Recent Labs    01/18/22 0625  HGBA1C 9.2*   Lipid Profile: No results for input(s): CHOL, HDL, LDLCALC, TRIG, CHOLHDL, LDLDIRECT in the last 72 hours. Thyroid function studies: Recent Labs    01/19/22 0918  TSH 66.381*   Anemia work up: No results for input(s): VITAMINB12, FOLATE,  FERRITIN, TIBC, IRON, RETICCTPCT in the last 72 hours. Sepsis Labs: Recent Labs  Lab 01/04/2022 1857 01/19/22 0918  WBC 11.3* 14.0*    Microbiology Recent Results (from the past 240 hour(s))  Resp Panel by RT-PCR (Flu A&B, Covid) Nasopharyngeal Swab     Status: None   Collection Time: 01/06/2022  8:30 PM   Specimen: Nasopharyngeal Swab; Nasopharyngeal(NP) swabs in vial transport medium  Result Value Ref Range Status   SARS Coronavirus 2 by RT PCR NEGATIVE NEGATIVE Final    Comment: (NOTE) SARS-CoV-2 target nucleic acids are NOT DETECTED.  The SARS-CoV-2 RNA is generally detectable in upper respiratory  specimens during the acute phase of infection. The lowest concentration of SARS-CoV-2 viral copies this assay can detect is 138 copies/mL. A negative result does not preclude SARS-Cov-2 infection and should not be used as the sole basis for treatment or other patient management decisions. A negative result may occur with  improper specimen collection/handling, submission of specimen other than nasopharyngeal swab, presence of viral mutation(s) within the areas targeted by this assay, and inadequate number of viral copies(<138 copies/mL). A negative result must be combined with clinical observations, patient history, and epidemiological information. The expected result is Negative.  Fact Sheet for Patients:  EntrepreneurPulse.com.au  Fact Sheet for Healthcare Providers:  IncredibleEmployment.be  This test is no t yet approved or cleared by the [place] FDA and  has been authorized for detection and/or diagnosis of SARS-CoV-2 by FDA under an Emergency Use Authorization (EUA). This EUA will remain  in effect (meaning this test can be used) for the duration of the COVID-19 declaration under Section 564(b)(1) of the Act, 21 U.S.C.section 360bbb-3(b)(1), unless the authorization is terminated  or revoked sooner.       Influenza A by PCR  NEGATIVE NEGATIVE Final   Influenza B by PCR NEGATIVE NEGATIVE Final    Comment: (NOTE) The Xpert Xpress SARS-CoV-2/FLU/RSV plus assay is intended as an aid in the diagnosis of influenza from Nasopharyngeal swab specimens and should not be used as a sole basis for treatment. Nasal washings and aspirates are unacceptable for Xpert Xpress SARS-CoV-2/FLU/RSV testing.  Fact Sheet for Patients: EntrepreneurPulse.com.au  Fact Sheet for Healthcare Providers: IncredibleEmployment.be  This test is not yet approved or cleared by the [place] FDA and has been authorized for detection and/or diagnosis of SARS-CoV-2 by FDA under an Emergency Use Authorization (EUA). This EUA will remain in effect (meaning this test can be used) for the duration of the COVID-19 declaration under Section 564(b)(1) of the Act, 21 U.S.C. section 360bbb-3(b)(1), unless the authorization is terminated or revoked.  Performed at [facility], [address] [place], [place] [address]     Procedures and diagnostic studies:  CT Head Wo Contrast  Result Date: 01/24/2022 CLINICAL DATA:  Mental status change, unknown cause EXAM: CT HEAD WITHOUT CONTRAST TECHNIQUE: Contiguous axial images were obtained from the base of the skull through the vertex without intravenous contrast. RADIATION DOSE REDUCTION: This exam was performed according to the departmental dose-optimization program which includes automated exposure control, adjustment of the mA and/or kV according to patient size and/or use of iterative reconstruction technique. COMPARISON:  None. FINDINGS: Brain: No intracranial hemorrhage, mass effect, or midline shift. No hydrocephalus. The basilar cisterns are patent. Mild periventricular and deep white matter hypodensity typical of chronic small vessel ischemia. No evidence of territorial infarct or acute ischemia. No extra-axial or intracranial fluid collection. Vascular:  Atherosclerosis of skullbase vasculature without hyperdense vessel or abnormal calcification. Skull: Sclerotic cortical thickening of the outer table of the right occipital bone, nonaggressive in appearance. Frontal hyperostosis. No fracture or suspicious bone lesion. Sinuses/Orbits: Paranasal sinuses and mastoid air cells are clear. The visualized orbits are unremarkable. Other: None. IMPRESSION: 1. No acute intracranial abnormality. 2. Mild chronic small vessel ischemia. Electronically Signed   By: Keith Rake M.D.   On: 12/29/2021 22:32   DG Chest Port 1 View  Result Date: 01/24/2022 CLINICAL DATA:  Missed dialysis EXAM: PORTABLE CHEST 1 VIEW COMPARISON:  None. FINDINGS: Mild cardiomegaly with vascular congestion. Possible small left effusion. No consolidation or pneumothorax IMPRESSION: Mild cardiomegaly with vascular  congestion and possible small left effusion Electronically Signed   By: Donavan Foil M.D.   On: 01/12/2022 20:29               LOS: 2 days   Carmalita Wakefield  [facility]   Pager on [email or web address]. If 7PM-7AM, please contact night-coverage at [email or web address]     01/19/2022, 1:14 PM

## 2022-01-19 NOTE — ED Notes (Signed)
Pt is continuing to waking up and start mumbling, talking off blanks, and reaching in the air like she is grabbing something.

## 2022-01-20 ENCOUNTER — Inpatient Hospital Stay: Payer: BLUE CROSS/BLUE SHIELD

## 2022-01-20 DIAGNOSIS — I639 Cerebral infarction, unspecified: Secondary | ICD-10-CM

## 2022-01-20 DIAGNOSIS — G9341 Metabolic encephalopathy: Secondary | ICD-10-CM | POA: Diagnosis not present

## 2022-01-20 LAB — CBC WITH DIFFERENTIAL/PLATELET
Abs Immature Granulocytes: 0.1 10*3/uL — ABNORMAL HIGH (ref 0.00–0.07)
Basophils Absolute: 0.1 10*3/uL (ref 0.0–0.1)
Basophils Relative: 0 %
Eosinophils Absolute: 0 10*3/uL (ref 0.0–0.5)
Eosinophils Relative: 0 %
HCT: 31.5 % — ABNORMAL LOW (ref 36.0–46.0)
Hemoglobin: 9.9 g/dL — ABNORMAL LOW (ref 12.0–15.0)
Immature Granulocytes: 1 %
Lymphocytes Relative: 5 %
Lymphs Abs: 1 10*3/uL (ref 0.7–4.0)
MCH: 29.1 pg (ref 26.0–34.0)
MCHC: 31.4 g/dL (ref 30.0–36.0)
MCV: 92.6 fL (ref 80.0–100.0)
Monocytes Absolute: 1.3 10*3/uL — ABNORMAL HIGH (ref 0.1–1.0)
Monocytes Relative: 7 %
Neutro Abs: 15.3 10*3/uL — ABNORMAL HIGH (ref 1.7–7.7)
Neutrophils Relative %: 87 %
Platelets: 285 10*3/uL (ref 150–400)
RBC: 3.4 MIL/uL — ABNORMAL LOW (ref 3.87–5.11)
RDW: 13.7 % (ref 11.5–15.5)
WBC: 17.7 10*3/uL — ABNORMAL HIGH (ref 4.0–10.5)
nRBC: 0 % (ref 0.0–0.2)

## 2022-01-20 LAB — COMPREHENSIVE METABOLIC PANEL
ALT: 39 U/L (ref 0–44)
ALT: 37 U/L (ref 0–44)
AST: 79 U/L — ABNORMAL HIGH (ref 15–41)
AST: 67 U/L — ABNORMAL HIGH (ref 15–41)
Albumin: 3.6 g/dL (ref 3.5–5.0)
Albumin: 3 g/dL — ABNORMAL LOW (ref 3.5–5.0)
Alkaline Phosphatase: 126 U/L (ref 38–126)
Alkaline Phosphatase: 114 U/L (ref 38–126)
Anion gap: 17 — ABNORMAL HIGH (ref 5–15)
Anion gap: 12 (ref 5–15)
BUN: 61 mg/dL — ABNORMAL HIGH (ref 8–23)
BUN: 63 mg/dL — ABNORMAL HIGH (ref 8–23)
CO2: 13 mmol/L — ABNORMAL LOW (ref 22–32)
CO2: 16 mmol/L — ABNORMAL LOW (ref 22–32)
Calcium: 9.6 mg/dL (ref 8.9–10.3)
Calcium: 8.8 mg/dL — ABNORMAL LOW (ref 8.9–10.3)
Chloride: 112 mmol/L — ABNORMAL HIGH (ref 98–111)
Chloride: 112 mmol/L — ABNORMAL HIGH (ref 98–111)
Creatinine, Ser: 6.88 mg/dL — ABNORMAL HIGH (ref 0.44–1.00)
Creatinine, Ser: 7.37 mg/dL — ABNORMAL HIGH (ref 0.44–1.00)
GFR, Estimated: 6 mL/min — ABNORMAL LOW (ref >60)
GFR, Estimated: 6 mL/min — ABNORMAL LOW (ref >60)
Glucose, Bld: 182 mg/dL — ABNORMAL HIGH (ref 70–99)
Glucose, Bld: 233 mg/dL — ABNORMAL HIGH (ref 70–99)
Potassium: 5 mmol/L (ref 3.5–5.1)
Potassium: 5.3 mmol/L — ABNORMAL HIGH (ref 3.5–5.1)
Sodium: 142 mmol/L (ref 135–145)
Sodium: 140 mmol/L (ref 135–145)
Total Bilirubin: 0.8 mg/dL (ref 0.3–1.2)
Total Bilirubin: 0.7 mg/dL (ref 0.3–1.2)
Total Protein: 7.4 g/dL (ref 6.5–8.1)
Total Protein: 6.7 g/dL (ref 6.5–8.1)

## 2022-01-20 LAB — GLUCOSE, CAPILLARY
Glucose-Capillary: 113 mg/dL — ABNORMAL HIGH (ref 70–99)
Glucose-Capillary: 129 mg/dL — ABNORMAL HIGH (ref 70–99)
Glucose-Capillary: 187 mg/dL — ABNORMAL HIGH (ref 70–99)
Glucose-Capillary: 210 mg/dL — ABNORMAL HIGH (ref 70–99)
Glucose-Capillary: 214 mg/dL — ABNORMAL HIGH (ref 70–99)
Glucose-Capillary: 215 mg/dL — ABNORMAL HIGH (ref 70–99)
Glucose-Capillary: 227 mg/dL — ABNORMAL HIGH (ref 70–99)

## 2022-01-20 LAB — MAGNESIUM: Magnesium: 2.6 mg/dL — ABNORMAL HIGH (ref 1.7–2.4)

## 2022-01-20 LAB — CBC
HCT: 29.3 % — ABNORMAL LOW (ref 36.0–46.0)
Hemoglobin: 9.1 g/dL — ABNORMAL LOW (ref 12.0–15.0)
MCH: 29.5 pg (ref 26.0–34.0)
MCHC: 31.1 g/dL (ref 30.0–36.0)
MCV: 95.1 fL (ref 80.0–100.0)
Platelets: 243 10*3/uL (ref 150–400)
RBC: 3.08 MIL/uL — ABNORMAL LOW (ref 3.87–5.11)
RDW: 14.1 % (ref 11.5–15.5)
WBC: 17.7 10*3/uL — ABNORMAL HIGH (ref 4.0–10.5)
nRBC: 0 % (ref 0.0–0.2)

## 2022-01-20 LAB — BLOOD GAS, ARTERIAL
Acid-base deficit: 14.3 mmol/L — ABNORMAL HIGH (ref 0.0–2.0)
Acid-base deficit: 0.2 mmol/L (ref 0.0–2.0)
Bicarbonate: 14.3 mmol/L — ABNORMAL LOW (ref 20.0–28.0)
Bicarbonate: 24.8 mmol/L (ref 20.0–28.0)
FIO2: 0.4
FIO2: 40
MECHVT: 420 mL
MECHVT: 420 mL
Mechanical Rate: 16
Mechanical Rate: 20
O2 Saturation: 96.3 %
O2 Saturation: 99.2 %
PEEP: 5 cmH2O
PEEP: 5 cmH2O
Patient temperature: 37
Patient temperature: 37
RATE: 16 resp/min
pCO2 arterial: 44 mmHg (ref 32.0–48.0)
pCO2 arterial: 41 mmHg (ref 32.0–48.0)
pH, Arterial: 7.12 — CL (ref 7.350–7.450)
pH, Arterial: 7.39 (ref 7.350–7.450)
pO2, Arterial: 110 mmHg — ABNORMAL HIGH (ref 83.0–108.0)
pO2, Arterial: 143 mmHg — ABNORMAL HIGH (ref 83.0–108.0)

## 2022-01-20 LAB — PROCALCITONIN: Procalcitonin: 0.56 ng/mL

## 2022-01-20 LAB — HEPATITIS B SURFACE ANTIGEN: Hepatitis B Surface Ag: NONREACTIVE

## 2022-01-20 LAB — T3, FREE: T3, Free: 0.9 pg/mL — ABNORMAL LOW (ref 2.0–4.4)

## 2022-01-20 LAB — HEPATITIS B SURFACE ANTIBODY,QUALITATIVE: Hep B S Ab: REACTIVE — AB

## 2022-01-20 LAB — MRSA NEXT GEN BY PCR, NASAL: MRSA by PCR Next Gen: NOT DETECTED

## 2022-01-20 IMAGING — DX DG ABDOMEN 1V
1 series · 1 of 1 positions shown · non-contrast
Comparison: Chest radiographs today and [DATE].

CLINICAL DATA: Nasogastric tube placement.

EXAM:
ABDOMEN - 1 VIEW

[chest ap]
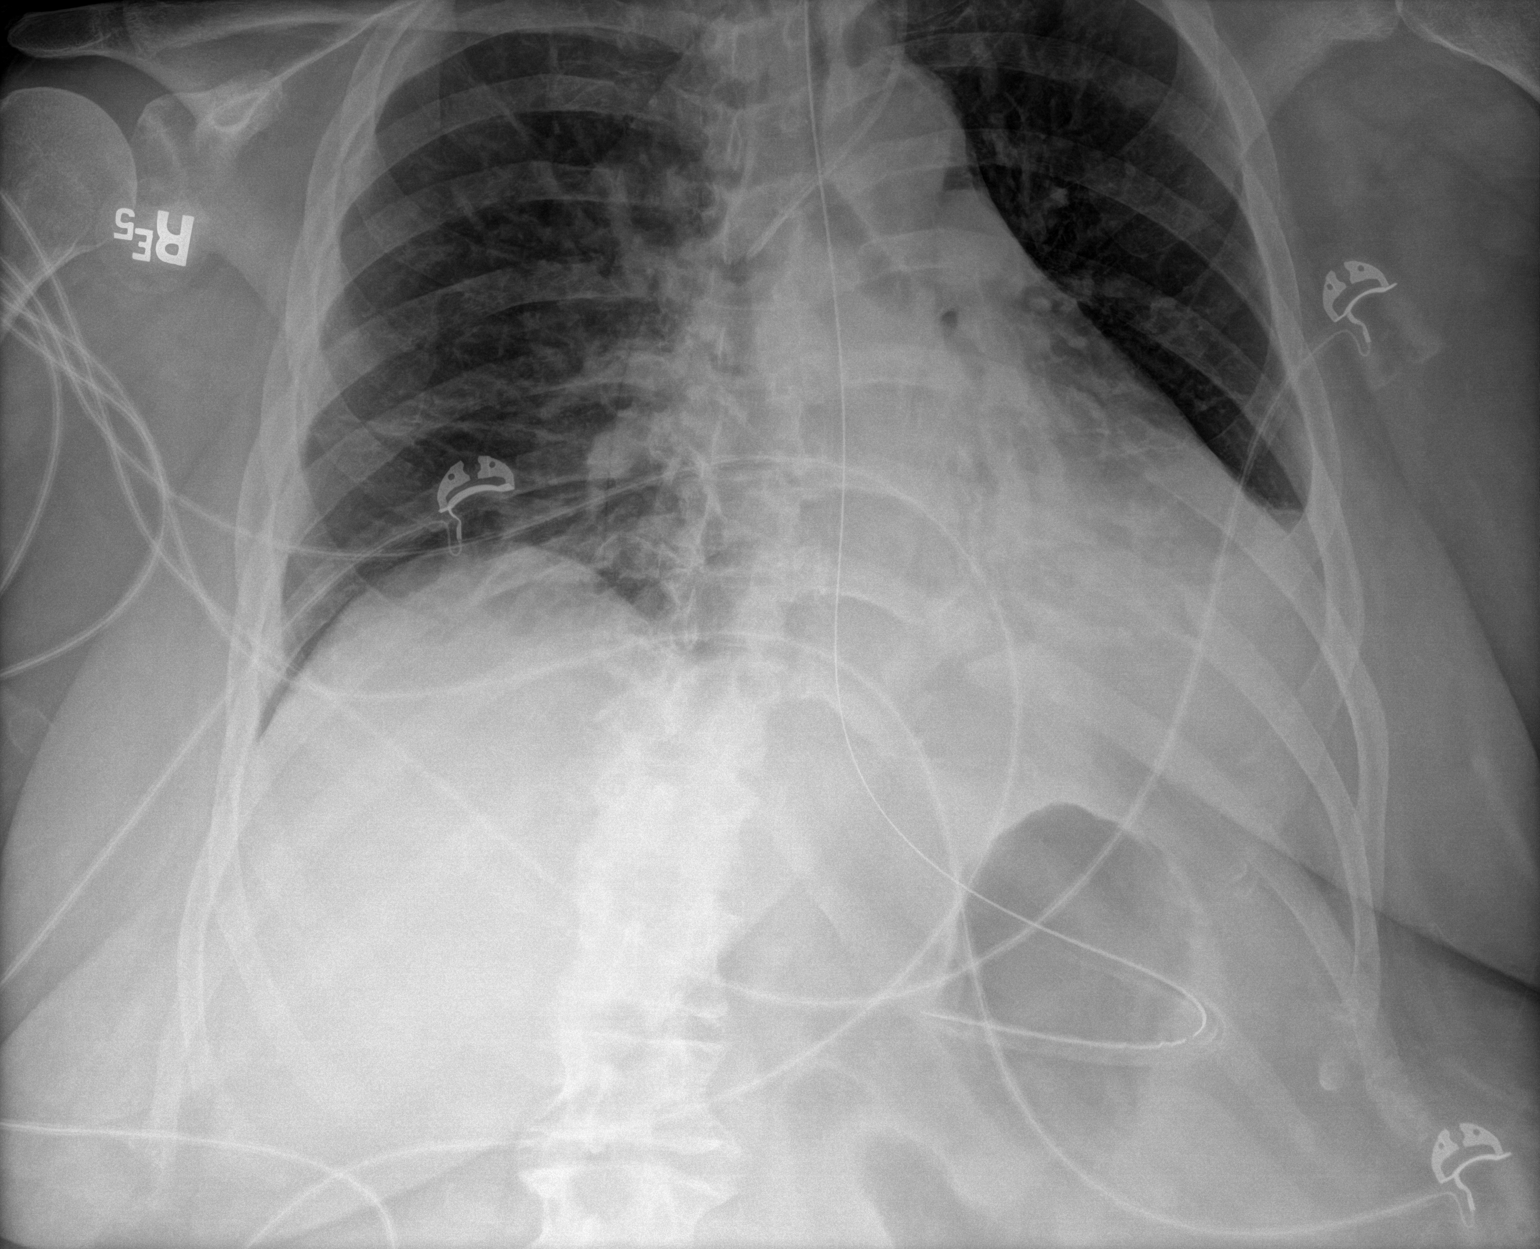

[1 of 1 positions shown; findings below may reference images not displayed]

FINDINGS: [MF] hours. Nasogastric tube projects below the diaphragm, tip
projected over the mid stomach. The visualized bowel gas pattern is
normal. Asymmetric left basilar pulmonary opacity, likely
atelectasis with a possible small pleural effusion.
IMPRESSION: Nasogastric tube projects over the mid stomach.

## 2022-01-20 IMAGING — DX DG CHEST 1V PORT
1 series · 1 of 1 positions shown · non-contrast
Comparison: Radiographs [DATE]

CLINICAL DATA: Central line placement. Endotracheal tube placement.

EXAM:
PORTABLE CHEST 1 VIEW

[chest ap]
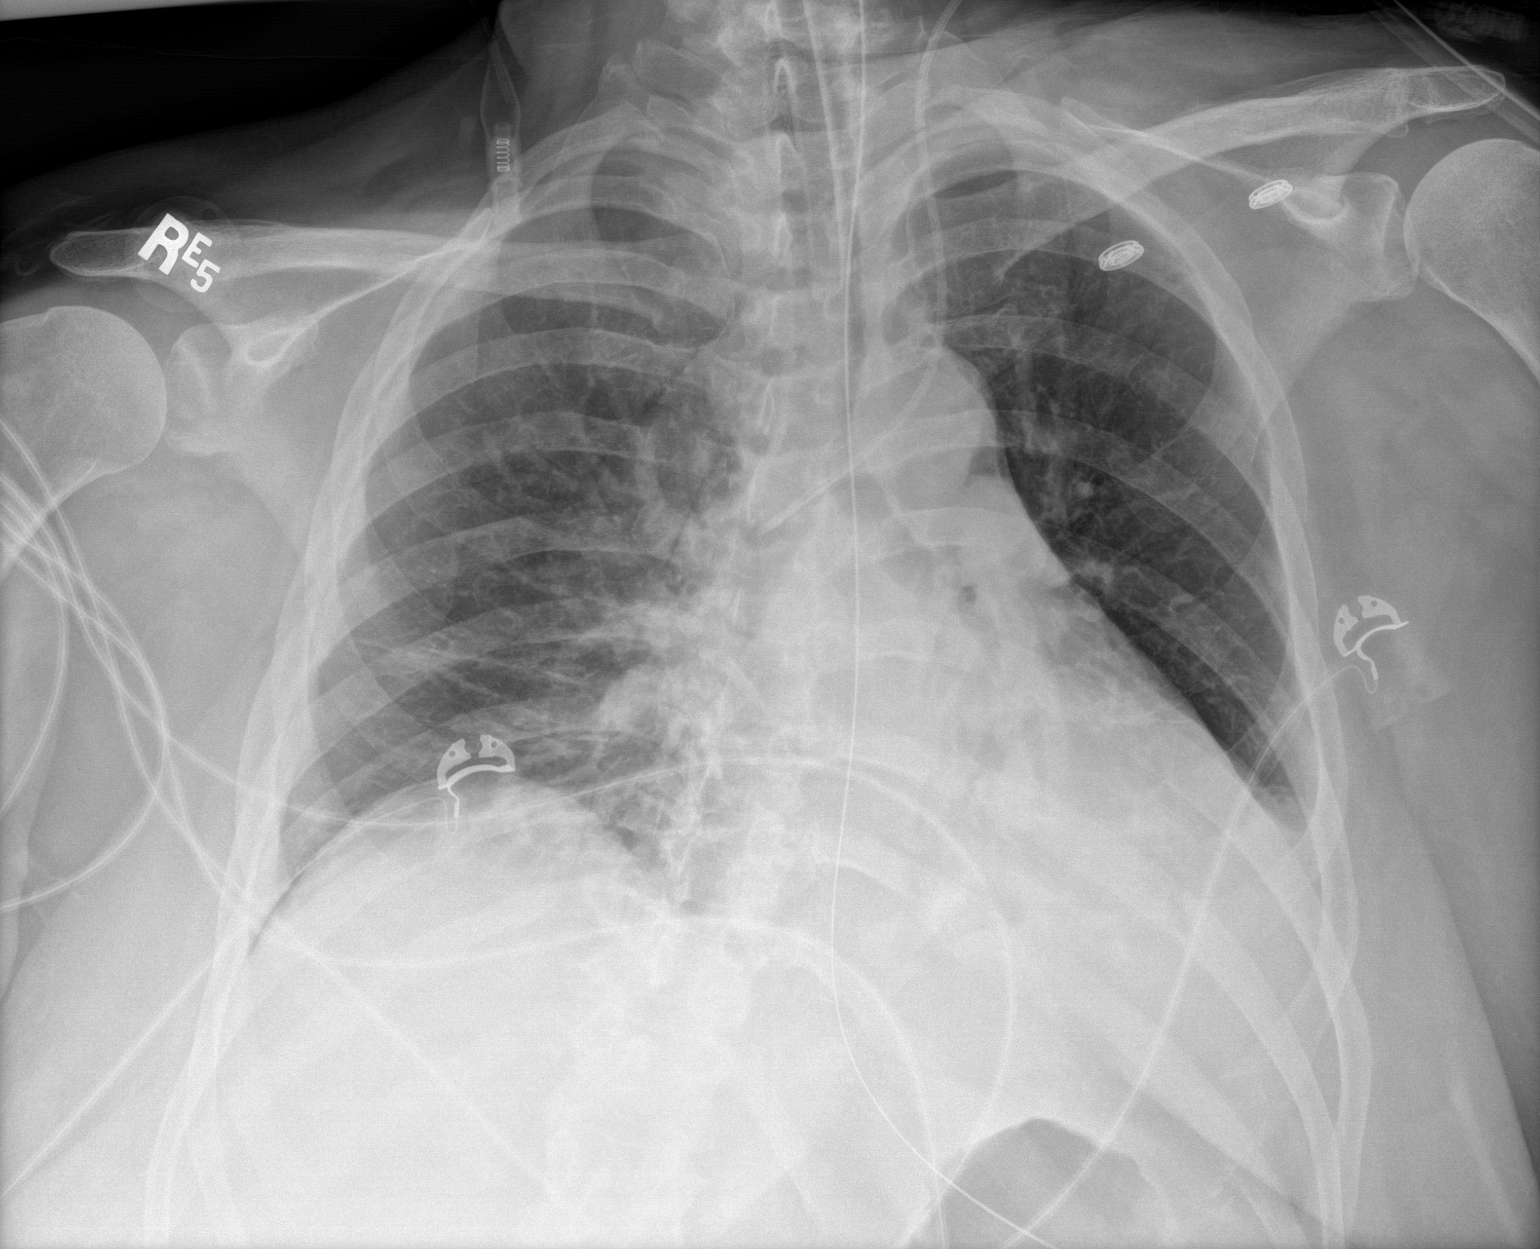

[1 of 1 positions shown; findings below may reference images not displayed]

FINDINGS: [IQ] hours. Endotracheal tube tip overlies the mid trachea. There is
a new left IJ central venous catheter which has an atypical course,
projecting over the aortic arch with its tip overlying the right
mainstem bronchus. A nasogastric tube projects below the diaphragm,
tip not visualized on this examination.

The heart size and mediastinal contours are stable allowing for mild
patient rotation. No evidence of mediastinal hematoma. There are
increased opacities at both lung bases, likely atelectasis. There is
a small left pleural effusion. No evidence of pneumothorax.
IMPRESSION: 1. New left IJ central venous catheter follows an atypical course
and cannot be confirmed to be intravenous by a single AP radiograph.
Tip overlies the right mainstem bronchus. Correlate for venous blood
return from the catheter.
2. Satisfactory position of the endotracheal tube.
3. Increased bibasilar atelectasis.  No pneumothorax.
4. These results will be called to the ordering clinician or
representative by the Radiologist Assistant, and communication
documented in the PACS or [REDACTED].

## 2022-01-20 MED ORDER — HEPARIN SODIUM (PORCINE) 1000 UNIT/ML DIALYSIS: 1000.0000 [IU] | INTRAMUSCULAR | Status: DC | PRN

## 2022-01-20 MED ORDER — SODIUM CHLORIDE 0.9 % IV SOLN: 100.0000 mL | INTRAVENOUS | Status: DC | PRN

## 2022-01-20 MED ORDER — ETOMIDATE 2 MG/ML IV SOLN: 20.0000 mg | Freq: Once | INTRAVENOUS | Status: AC

## 2022-01-20 MED ORDER — ADULT MULTIVITAMIN LIQUID CH
15.0000 mL | Freq: Every day | ORAL | Status: DC
  Administered 2022-01-21 – 2022-01-23 (×3): 15 mL
  Filled 2022-01-20 (×3): qty 15

## 2022-01-20 MED ORDER — MIDAZOLAM HCL 2 MG/2ML IJ SOLN
INTRAMUSCULAR | Status: AC
  Filled 2022-01-20: qty 2

## 2022-01-20 MED ORDER — FENTANYL CITRATE PF 50 MCG/ML IJ SOSY
50.0000 ug | PREFILLED_SYRINGE | Freq: Once | INTRAMUSCULAR | Status: AC
  Administered 2022-01-20: 50 ug via INTRAVENOUS

## 2022-01-20 MED ORDER — ORAL CARE MOUTH RINSE
15.0000 mL | OROMUCOSAL | Status: DC
  Administered 2022-01-20 – 2022-01-27 (×73): 15 mL via OROMUCOSAL

## 2022-01-20 MED ORDER — RENA-VITE PO TABS
1.0000 | ORAL_TABLET | Freq: Every day | ORAL | Status: DC
  Administered 2022-01-20 – 2022-01-27 (×8): 1
  Filled 2022-01-20 (×8): qty 1

## 2022-01-20 MED ORDER — NOREPINEPHRINE 4 MG/250ML-% IV SOLN
0.0000 ug/min | INTRAVENOUS | Status: DC
  Administered 2022-01-20: 5 ug/min via INTRAVENOUS
  Administered 2022-01-21: 2 ug/min via INTRAVENOUS
  Administered 2022-01-23: 5 ug/min via INTRAVENOUS
  Administered 2022-01-23: 4 ug/min via INTRAVENOUS
  Administered 2022-01-23: 10 ug/min via INTRAVENOUS
  Administered 2022-01-23: 16 ug/min via INTRAVENOUS
  Administered 2022-01-24: 5 ug/min via INTRAVENOUS
  Administered 2022-01-25: 2 ug/min via INTRAVENOUS
  Administered 2022-01-26: 6 ug/min via INTRAVENOUS
  Administered 2022-01-26 – 2022-01-27 (×2): 5 ug/min via INTRAVENOUS
  Administered 2022-01-27: 28 ug/min via INTRAVENOUS
  Administered 2022-01-27: 8 ug/min via INTRAVENOUS
  Filled 2022-01-20 (×13): qty 250

## 2022-01-20 MED ORDER — MIDAZOLAM HCL 2 MG/2ML IJ SOLN
2.0000 mg | Freq: Once | INTRAMUSCULAR | Status: AC
  Administered 2022-01-20: 2 mg via INTRAVENOUS

## 2022-01-20 MED ORDER — ALTEPLASE 2 MG IJ SOLR: 2.0000 mg | Freq: Once | INTRAMUSCULAR | Status: DC | PRN

## 2022-01-20 MED ORDER — DIAZEPAM 5 MG/ML IJ SOLN
5.0000 mg | Freq: Once | INTRAMUSCULAR | Status: AC
  Administered 2022-01-20: 5 mg via INTRAVENOUS
  Filled 2022-01-20: qty 2

## 2022-01-20 MED ORDER — ETOMIDATE 2 MG/ML IV SOLN
INTRAVENOUS | Status: AC
  Administered 2022-01-20: 20 mg
  Filled 2022-01-20: qty 10

## 2022-01-20 MED ORDER — PROSOURCE TF PO LIQD
90.0000 mL | Freq: Four times a day (QID) | ORAL | Status: DC
  Administered 2022-01-20 – 2022-01-23 (×11): 90 mL
  Filled 2022-01-20 (×13): qty 90

## 2022-01-20 MED ORDER — FENTANYL CITRATE (PF) 100 MCG/2ML IJ SOLN
INTRAMUSCULAR | Status: AC
  Administered 2022-01-20: 100 ug
  Filled 2022-01-20: qty 2

## 2022-01-20 MED ORDER — ALBUTEROL SULFATE (2.5 MG/3ML) 0.083% IN NEBU
2.5000 mg | INHALATION_SOLUTION | Freq: Four times a day (QID) | RESPIRATORY_TRACT | Status: DC
  Administered 2022-01-20 – 2022-01-27 (×30): 2.5 mg via RESPIRATORY_TRACT
  Filled 2022-01-20 (×30): qty 3

## 2022-01-20 MED ORDER — THIAMINE HCL 100 MG/ML IJ SOLN
500.0000 mg | Freq: Every day | INTRAVENOUS | Status: AC
  Administered 2022-01-20 – 2022-01-24 (×5): 500 mg via INTRAVENOUS
  Filled 2022-01-20 (×5): qty 5

## 2022-01-20 MED ORDER — CHLORHEXIDINE GLUCONATE 0.12% ORAL RINSE (MEDLINE KIT)
15.0000 mL | Freq: Two times a day (BID) | OROMUCOSAL | Status: DC
  Administered 2022-01-20 – 2022-01-27 (×16): 15 mL via OROMUCOSAL

## 2022-01-20 MED ORDER — LIDOCAINE-PRILOCAINE 2.5-2.5 % EX CREA
1.0000 "application " | TOPICAL_CREAM | CUTANEOUS | Status: DC | PRN
  Filled 2022-01-20: qty 5

## 2022-01-20 MED ORDER — PROPOFOL 1000 MG/100ML IV EMUL
5.0000 ug/kg/min | INTRAVENOUS | Status: DC
  Administered 2022-01-20: 12:00:00 50 ug/kg/min via INTRAVENOUS
  Administered 2022-01-20: 30 ug/kg/min via INTRAVENOUS
  Administered 2022-01-20: 20 ug/kg/min via INTRAVENOUS
  Administered 2022-01-21: 30 ug/kg/min via INTRAVENOUS
  Administered 2022-01-21: 20 ug/kg/min via INTRAVENOUS
  Administered 2022-01-21 (×3): 30 ug/kg/min via INTRAVENOUS
  Administered 2022-01-22: 10.022 ug/kg/min via INTRAVENOUS
  Administered 2022-01-22: 10 ug/kg/min via INTRAVENOUS
  Administered 2022-01-23: 30 ug/kg/min via INTRAVENOUS
  Filled 2022-01-20 (×12): qty 100

## 2022-01-20 MED ORDER — PANTOPRAZOLE SODIUM 40 MG IV SOLR
40.0000 mg | Freq: Every day | INTRAVENOUS | Status: DC
  Administered 2022-01-20 – 2022-01-27 (×8): 40 mg via INTRAVENOUS
  Filled 2022-01-20 (×8): qty 40

## 2022-01-20 MED ORDER — LIDOCAINE HCL (PF) 1 % IJ SOLN
5.0000 mL | INTRAMUSCULAR | Status: DC | PRN
  Filled 2022-01-20: qty 5

## 2022-01-20 MED ORDER — INSULIN ASPART 100 UNIT/ML IJ SOLN
0.0000 [IU] | INTRAMUSCULAR | Status: DC
  Administered 2022-01-20: 2 [IU] via SUBCUTANEOUS
  Administered 2022-01-20: 3 [IU] via SUBCUTANEOUS
  Administered 2022-01-20: 5 [IU] via SUBCUTANEOUS
  Administered 2022-01-21: 3 [IU] via SUBCUTANEOUS
  Administered 2022-01-21 (×4): 2 [IU] via SUBCUTANEOUS
  Administered 2022-01-21: 3 [IU] via SUBCUTANEOUS
  Administered 2022-01-22: 5 [IU] via SUBCUTANEOUS
  Administered 2022-01-22: 8 [IU] via SUBCUTANEOUS
  Administered 2022-01-22 (×3): 5 [IU] via SUBCUTANEOUS
  Administered 2022-01-23 (×3): 8 [IU] via SUBCUTANEOUS
  Administered 2022-01-23 (×2): 5 [IU] via SUBCUTANEOUS
  Filled 2022-01-20 (×19): qty 1

## 2022-01-20 MED ORDER — SODIUM CHLORIDE 0.9 % IV SOLN: INTRAVENOUS | Status: DC

## 2022-01-20 MED ORDER — ROCURONIUM BROMIDE 50 MG/5ML IV SOLN
50.0000 mg | Freq: Once | INTRAVENOUS | Status: AC
  Administered 2022-01-20: 50 mg via INTRAVENOUS
  Filled 2022-01-20: qty 5

## 2022-01-20 MED ORDER — ROCURONIUM BROMIDE 10 MG/ML (PF) SYRINGE
PREFILLED_SYRINGE | INTRAVENOUS | Status: AC
  Filled 2022-01-20: qty 10

## 2022-01-20 MED ORDER — EPOETIN ALFA 10000 UNIT/ML IJ SOLN
4000.0000 [IU] | INTRAMUSCULAR | Status: DC
  Administered 2022-01-23: 4000 [IU] via INTRAVENOUS
  Filled 2022-01-20: qty 1

## 2022-01-20 MED ORDER — FREE WATER
30.0000 mL | Status: DC
  Administered 2022-01-20 – 2022-01-27 (×43): 30 mL

## 2022-01-20 MED ORDER — PENTAFLUOROPROP-TETRAFLUOROETH EX AERO
1.0000 "application " | INHALATION_SPRAY | CUTANEOUS | Status: DC | PRN
  Filled 2022-01-20: qty 30

## 2022-01-20 MED ORDER — FENTANYL CITRATE (PF) 100 MCG/2ML IJ SOLN
100.0000 ug | Freq: Once | INTRAMUSCULAR | Status: AC
  Administered 2022-01-20: 100 ug via INTRAVENOUS
  Filled 2022-01-20: qty 2

## 2022-01-20 MED ORDER — VITAL HIGH PROTEIN PO LIQD
1000.0000 mL | ORAL | Status: AC
  Administered 2022-01-20 – 2022-01-22 (×3): 1000 mL

## 2022-01-20 MED ORDER — FENTANYL 2500MCG IN NS 250ML (10MCG/ML) PREMIX INFUSION
0.0000 ug/h | INTRAVENOUS | Status: DC
  Administered 2022-01-20: 25 ug/h via INTRAVENOUS
  Administered 2022-01-21: 150 ug/h via INTRAVENOUS
  Administered 2022-01-22: 100 ug/h via INTRAVENOUS
  Filled 2022-01-20 (×3): qty 250

## 2022-01-20 NOTE — Progress Notes (Signed)
[facility], [place] 01/20/22  Subjective:   LOS: 3  Patient known to our practice from home hemodialysis.  She is admitted for altered mental status. Patient continues to remain agitated and was transferred to ICU this morning.  She was found to have severe bradycardia and continued to have altered mental status. She was seen prior to intubation.   Objective:  Vital signs in last 24 hours:  Temp:  [97.4 F (36.3 C)-97.8 F (36.6 C)] 97.4 F (36.3 C) (01/27 0837) Pulse Rate:  [64-103] 73 (01/27 1100) Resp:  [16-32] 20 (01/27 1100) BP: (112-161)/(57-112) 112/57 (01/27 1100) SpO2:  [90 %-100 %] 98 % (01/27 1100) FiO2 (%):  [40 %] 40 % (01/27 0910) Weight:  [108.1 kg] 108.1 kg (01/27 0837)  Weight change:  Filed Weights   01/20/22 0837  Weight: 108.1 kg    Intake/Output:   No intake or output data in the 24 hours ending 01/20/22 1136    Physical Exam: General: Agitated,  HEENT Mouth appears dry  Pulm/lungs Room air, normal breathing effort  CVS/Heart no rub  Abdomen:  Soft, nontender  Extremities: No peripheral edema  Neurologic: Agitated, not able to follow commands  Skin: Warm, dry  Access: Left arm AV fistula       Basic Metabolic Panel:  Recent Labs  Lab 12/31/2021 1857 01/19/22 0918 01/20/22 0417 01/20/22 1058  NA 134* 138 142 140  K 4.4 4.7 5.0 5.3*  CL 105 110 112* 112*  CO2 16* 13* 13* 16*  GLUCOSE 211* 188* 182* 233*  BUN 49* 53* 61* 63*  CREATININE 5.90* 6.39* 6.88* 7.37*  CALCIUM 9.7 9.6 9.6 8.8*  MG  --   --   --  2.6*      CBC: Recent Labs  Lab 12/26/2021 1857 01/19/22 0918 01/20/22 0417 01/20/22 1058  WBC 11.3* 14.0* 17.7* 17.7*  NEUTROABS 8.9* 12.1* 15.3*  --   HGB 10.9* 10.2* 9.9* 9.1*  HCT 33.7* 31.6* 31.5* 29.3*  MCV 92.1 91.1 92.6 95.1  PLT 281 260 285 243      No results found for: HEPBSAG, HEPBSAB, HEPBIGM    Microbiology:  Recent Results (from the past 240 hour(s))  Resp Panel by  RT-PCR (Flu A&B, Covid) Nasopharyngeal Swab     Status: None   Collection Time: 01/02/2022  8:30 PM   Specimen: Nasopharyngeal Swab; Nasopharyngeal(NP) swabs in vial transport medium  Result Value Ref Range Status   SARS Coronavirus 2 by RT PCR NEGATIVE NEGATIVE Final    Comment: (NOTE) SARS-CoV-2 target nucleic acids are NOT DETECTED.  The SARS-CoV-2 RNA is generally detectable in upper respiratory specimens during the acute phase of infection. The lowest concentration of SARS-CoV-2 viral copies this assay can detect is 138 copies/mL. A negative result does not preclude SARS-Cov-2 infection and should not be used as the sole basis for treatment or other patient management decisions. A negative result may occur with  improper specimen collection/handling, submission of specimen other than nasopharyngeal swab, presence of viral mutation(s) within the areas targeted by this assay, and inadequate number of viral copies(<138 copies/mL). A negative result must be combined with clinical observations, patient history, and epidemiological information. The expected result is Negative.  Fact Sheet for Patients:  EntrepreneurPulse.com.au  Fact Sheet for Healthcare Providers:  IncredibleEmployment.be  This test is no t yet approved or cleared by the [place] FDA and  has been authorized for detection and/or diagnosis of SARS-CoV-2 by FDA under an Emergency Use Authorization (  EUA). This EUA will remain  in effect (meaning this test can be used) for the duration of the COVID-19 declaration under Section 564(b)(1) of the Act, 21 U.S.C.section 360bbb-3(b)(1), unless the authorization is terminated  or revoked sooner.       Influenza A by PCR NEGATIVE NEGATIVE Final   Influenza B by PCR NEGATIVE NEGATIVE Final    Comment: (NOTE) The Xpert Xpress SARS-CoV-2/FLU/RSV plus assay is intended as an aid in the diagnosis of influenza from Nasopharyngeal swab  specimens and should not be used as a sole basis for treatment. Nasal washings and aspirates are unacceptable for Xpert Xpress SARS-CoV-2/FLU/RSV testing.  Fact Sheet for Patients: EntrepreneurPulse.com.au  Fact Sheet for Healthcare Providers: IncredibleEmployment.be  This test is not yet approved or cleared by the [place] FDA and has been authorized for detection and/or diagnosis of SARS-CoV-2 by FDA under an Emergency Use Authorization (EUA). This EUA will remain in effect (meaning this test can be used) for the duration of the COVID-19 declaration under Section 564(b)(1) of the Act, 21 U.S.C. section 360bbb-3(b)(1), unless the authorization is terminated or revoked.  Performed at [facility], [address] [place], [place] [address]   MRSA Next Gen by PCR, Nasal     Status: None   Collection Time: 01/20/22  8:50 AM   Specimen: Nasal Mucosa; Nasal Swab  Result Value Ref Range Status   MRSA by PCR Next Gen NOT DETECTED NOT DETECTED Final    Comment: (NOTE) The GeneXpert MRSA Assay (FDA approved for NASAL specimens only), is one component of a comprehensive MRSA colonization surveillance program. It is not intended to diagnose MRSA infection nor to guide or monitor treatment for MRSA infections. Test performance is not FDA approved in patients less than 72 years old. Performed at [facility], [address] [place], [place] [address]     Coagulation Studies: No results for input(s): LABPROT, INR in the last 72 hours.  Urinalysis: Recent Labs    01/11/2022 2008  COLORURINE YELLOW  LABSPEC 1.020  PHURINE 5.0  GLUCOSEU 100*  HGBUR MODERATE*  BILIRUBINUR NEGATIVE  KETONESUR 15*  PROTEINUR >300*  NITRITE NEGATIVE  LEUKOCYTESUR NEGATIVE       Imaging: DG Abd 1 View  Result Date: 01/20/2022 CLINICAL DATA:  Nasogastric tube placement. EXAM: ABDOMEN - 1 VIEW COMPARISON:  Chest radiographs today and  01/22/2022. FINDINGS: 1026 hours. Nasogastric tube projects below the diaphragm, tip projected over the mid stomach. The visualized bowel gas pattern is normal. Asymmetric left basilar pulmonary opacity, likely atelectasis with a possible small pleural effusion. IMPRESSION: Nasogastric tube projects over the mid stomach. Electronically Signed   By: Richardean Sale M.D.   On: 01/20/2022 10:49   Portable Chest x-ray  Result Date: 01/20/2022 CLINICAL DATA:  Central line placement. Endotracheal tube placement. EXAM: PORTABLE CHEST 1 VIEW COMPARISON:  Radiographs 12/31/2021 FINDINGS: 1027 hours. Endotracheal tube tip overlies the mid trachea. There is a new left IJ central venous catheter which has an atypical course, projecting over the aortic arch with its tip overlying the right mainstem bronchus. A nasogastric tube projects below the diaphragm, tip not visualized on this examination. The heart size and mediastinal contours are stable allowing for mild patient rotation. No evidence of mediastinal hematoma. There are increased opacities at both lung bases, likely atelectasis. There is a small left pleural effusion. No evidence of pneumothorax. IMPRESSION: 1. New left IJ central venous catheter follows an atypical course and cannot be confirmed to be intravenous by  a single AP radiograph. Tip overlies the right mainstem bronchus. Correlate for venous blood return from the catheter. 2. Satisfactory position of the endotracheal tube. 3. Increased bibasilar atelectasis.  No pneumothorax. 4. These results will be called to the ordering clinician or representative by the Radiologist Assistant, and communication documented in the PACS or [employer]. Electronically Signed   By: Richardean Sale M.D.   On: 01/20/2022 10:53     Medications:    fentaNYL infusion INTRAVENOUS     propofol (DIPRIVAN) infusion     thiamine injection      albuterol  2.5 mg Nebulization Q6H   amLODipine  10 mg Oral Daily    chlorhexidine gluconate (MEDLINE KIT)  15 mL Mouth Rinse BID   Chlorhexidine Gluconate Cloth  6 each Topical Q0600   etomidate  20 mg Intravenous Once   fentaNYL (SUBLIMAZE) injection  100 mcg Intravenous Once   fentaNYL (SUBLIMAZE) injection  50 mcg Intravenous Once   heparin  5,000 Units Subcutaneous Q8H   hydrALAZINE  25 mg Oral Q8H   insulin aspart  0-15 Units Subcutaneous Q4H   levothyroxine  100 mcg Intravenous Daily   mouth rinse  15 mL Mouth Rinse 10 times per day   midazolam       midazolam       pantoprazole (PROTONIX) IV  40 mg Intravenous Daily   risperiDONE  0.5 mg Oral TID   rocuronium bromide       rOPINIRole  0.25 mg Oral QHS   acetaminophen **OR** acetaminophen, labetalol, ondansetron **OR** ondansetron (ZOFRAN) IV  Assessment/ Plan:  66 y.o. female with medical problems of  End-stage renal disease, on dialysis since 2015, hypothyroidism, hyperlipidemia, diabetes, peripheral neuropathy, morbid obesity, mono, gammopathy, ischemic optic neuropathy, regarding oral thrush, COVID-19 in September 2022 history of endometrial carcinoma, obstructive sleep apnea was admitted on 01/13/2022 for  Principal Problem:   Acute metabolic encephalopathy Active Problems:   Chronic home hemodialysis status ([place])   Hypertensive urgency   Non-compliance with renal dialysis (HCC)   Anemia of chronic kidney failure, stage 5 (HCC)   Elevated troponin   Fluid overload   Involuntary commitment   Type 2 diabetes mellitus with kidney complication, with long-term current use of insulin ([place])   Hypothyroidism   Endometrial cancer ([place])   H/O radioactive iodine thyroid ablation  Acute metabolic encephalopathy [P38.25]  #. ESRD, with hyperkalemia Plan for hemodialysis later today after stabilized on mechanical ventilation. Nursing staff is aware and orders are in place. To give her 2 potassium bath Dialysis consent was obtained from husband at the time of admission  #. Anemia of CKD  Lab  Results  Component Value Date   HGB 9.1 (L) 01/20/2022   Low dose EPO with HD  #. Secondary hyperparathyroidism of renal origin N 25.81   No results found for: PTH No results found for: PHOS Monitor calcium and phos level during this admission   #. Diabetes type 2 with CKD Hgb A1c MFr Bld (%)  Date Value  01/18/2022 9.2 (H)  Plan as hospitalist team  #Severe hypothyroidism, myxedema coma Patient is getting IV Synthroid   LOS: [place] 1/27/202311:36 AM  [place], [place]

## 2022-01-20 NOTE — Consult Note (Signed)
NAME:  Regina Jackson, MRN:  [id number], DOB:  1956-09-30, LOS: 3 ADMISSION DATE:  01/16/2022, CONSULTATION DATE:  01/20/22 REFERRING MD:  Dr. Mal Misty, CHIEF COMPLAINT:  AMS, Acute Respiratory Distress   Brief Pt Description / Synopsis:  66 y.o. Female with PMH significant for ESRD on HD, admitted with Acute Metabolic Encephalopathy due to multiple severe metabolic derangements in setting of missed HD sessions and Hypothyroidism (query ? Myxedema coma).  Developed acute respiratory distress requiring intubation and mechanical ventilation.  History of Present Illness:  Regina Jackson is a 66 year old female with a past medical history significant for ESRD on home hemodialysis with noncompliance at times, hypothyroidism, anemia of chronic disease, endometrial cancer, hypertension, diabetes mellitus who presented to [facility] ED on 01/13/2022 due to altered mental status and increasing agitation.  Patient had no chest pain, shortness of breath, abdominal pain, nausea, vomiting, diarrhea.  The patient's husband reported she had not been compliant with her home hemodialysis in nearly 8 days, also unsure if she has been taking her thyroid medication.  ED Course: Initial vital signs: Temperature 97.7 orally, respiratory 15, pulse 96, blood pressure 199/163 Significant labs: Sodium 134, bicarbonate 16, glucose 211, BUN 49, creatinine 5.9, alkaline phosphatase 132, BNP 329, high-sensitivity troponin 42, WBC 11.3, hemoglobin 10.9, TSH 58.8 Venous blood gas: pH 7.3/PCO2 33/PO2 54/bicarbonate 16.2 COVID-19 PCR negative Imaging: Chest x-ray:Mild cardiomegaly with vascular congestion. Possible small left effusion. No consolidation or pneumothorax CT head:IMPRESSION: 1. No acute intracranial abnormality. 2. Mild chronic small vessel ischemia.   She was placed under IVC, and the hospitalist were asked to admit.  Nephrology was consulted for dialysis during her stay.  However given her agitation and confusion, dialysis  was unable to's be performed safely therefore he was deferred for reevaluation in the next day.  Interval History She was given IV Haldol and Valium with little improvement in her agitation.  Psychiatry was consulted.  On 01/20/2022 she remained altered, but developed acute respiratory distress requiring transfer to ICU and emergent endotracheal intubation and mechanical ventilation.   Pertinent  Medical History  ESRD on hemodialysis Noncompliance with dialysis Anemia of chronic disease Endometrial cancer Hypothyroidism Hypertension diabetes mellitus  Micro Data:  1/24: SARS-CoV-2 and influenza PCR>> negative 1/25: HIV screen>> nonreactive 1/27: MRSA PCR>> negative 1/27: Tracheal aspirate>>  Antimicrobials:  N/A  Significant Hospital Events: Including procedures, antibiotic start and stop dates in addition to other pertinent events   1/24: Admitted by the hospitalist for altered mental status and hypertensive urgency.  Nephrology consulted 1/25: Unable to safely perform dialysis due to severe agitation and confusion.  Plan to reassess HD tomorrow 1/26: Received IV Haldol and Valium without improvement in mental status.  Psychiatry consulted 1/27: Required emergent intubation due to AMS and Acute Respiratory Distress.  MRI and EEG pending, Neuro consulted.  Plan for HD now that pt is sedated.  Interim History / Subjective:  -Patient very altered and restless, concern for ability to protect airway -With acute respiratory distress and increased work of breathing ~required emergent intubation -Plan for HD now that pt is sedated -Plan for MRI brain and EEG  Objective   Blood pressure (!) 150/112, pulse 64, temperature (!) 97.4 F (36.3 C), temperature source Axillary, resp. rate (!) 32, height 5' 7.52" (1.715 m), weight 108.1 kg, SpO2 91 %.    Vent Mode: PRVC FiO2 (%):  [40 %] 40 % Set Rate:  [16 bmp] 16 bmp Vt Set:  [420 mL] 420 mL PEEP:  [5 cmH20] 5  cmH20  No intake or  output data in the 24 hours ending 01/20/22 1006 Filed Weights   01/20/22 0837  Weight: 108.1 kg    Examination: General: Critically ill-appearing female, laying in bed, confused and restless, with acute respiratory distress HENT: Atraumatic, normocephalic, neck supple, positive JVD Lungs: Coarse breath sounds bilaterally, tachypnea, increased work of breathing accessory muscle use, even Cardiovascular: Tachycardia, regular rhythm, S1-S2, no murmurs, rubs, gallops Abdomen: Obese, soft, nontender, nondistended, no guarding rebound tenderness, bowel sounds positive his work Extremities: No deformities, 2+ edema bilateral lower extremities Neuro: Agitated and restless, moves all extremities purposefully but does not follow commands GU: Deferred  Resolved Hospital Problem list     Assessment & Plan:   Acute Hypoxic Respiratory Failure in the setting of severe metabolic derangements Concern for inability to protect airway -Full vent support, implement lung protective strategies -Plateau pressures less than 30 cm H20 -Wean FiO2 & PEEP as tolerated to maintain O2 sats >92% -Follow intermittent Chest X-ray & ABG as needed -Spontaneous Breathing Trials when respiratory parameters met and mental status permits -Implement VAP Bundle -Prn Bronchodilators  ESRD on Hemodialysis Mild Hyperkalemia Metabolic Acidosis -Monitor I&O's / urinary output -Follow BMP -Ensure adequate renal perfusion -Avoid nephrotoxic agents as able -Replace electrolytes as indicated -Nephrology following, appreciate input -HD as per Nephrology  Leukocytosis, unclear etiology ? Aspiration -Monitor fever curve -Trend WBC's & Procalcitonin -Follow cultures as above -UA not concerning for UTI -CXR with increased opacities at both lung bases, likely atelectasis -Will await Procalcitonin results, with low threshold to start ABX  Diabetes Mellitus Hypothyroidism, query ? Myxedema Coma PMHx: Radioactive Iodine  Thyroid ablation -CBG's q4h; Target range of 140 to 180 -SSI -Follow ICU Hypo/Hyperglycemia protocol -Continue IV Synthroid 100 mg daily (received 200 mg x1 earlier in stay) -Follow TSH and thyroid panel  Acute Metabolic Encephalopathy in setting of multiple severe metabolic derangements (AKI and uremia, Hypothyroidism with ? Myxedema coma) Sedation needs in the setting of mechanical ventilation -Maintain a RASS goal of 0 to -1 -Fentanyl and Propofol to maintain RASS goal -Avoid sedating medications as able -Daily wake up assessment -Initial CT Head negative in ED -Obtain MRI and EEG -Consult Neurology, appreciate input -Correction of multiple metabolic derangements as above -Thiamine IV  ESRD on Hemodialysis Mild Hyperkalemia Anion Gap Metabolic Acidosis -Monitor I&O's / urinary output -Follow BMP -Ensure adequate renal perfusion -Avoid nephrotoxic agents as able -Replace electrolytes as indicated -Nephrology following, appreciate input -HD as per Nephrology  Anemia of chronic disease -Monitor for S/Sx of bleeding -Trend CBC -Heparin SQ for VTE Prophylaxis  -Transfuse for Hgb <7  Pt is critically ill, prognosis is guarded.  High risk for cardiac arrest and death.   Best Practice (right click and "Reselect all SmartList Selections" daily)   Diet/type: NPO DVT prophylaxis: prophylactic heparin  GI prophylaxis: PPI Lines: Central line and yes and it is still needed Foley:  Yes, and it is still needed Code Status:  full code Last date of multidisciplinary goals of care discussion [1/27]  Updated pt's brother and sister-in-law at bedside 10/27.  All questions answered.  Labs   CBC: Recent Labs  Lab 01/08/2022 1857 01/19/22 0918 01/20/22 0417  WBC 11.3* 14.0* 17.7*  NEUTROABS 8.9* 12.1* 15.3*  HGB 10.9* 10.2* 9.9*  HCT 33.7* 31.6* 31.5*  MCV 92.1 91.1 92.6  PLT 281 260 616    Basic Metabolic Panel: Recent Labs  Lab 12/28/2021 1857 01/19/22 0918  01/20/22 0417  NA 134* 138 142  K 4.4 4.7 5.0  CL 105 110 112*  CO2 16* 13* 13*  GLUCOSE 211* 188* 182*  BUN 49* 53* 61*  CREATININE 5.90* 6.39* 6.88*  CALCIUM 9.7 9.6 9.6   GFR: Estimated Creatinine Clearance: 10.7 mL/min (A) (by C-G formula based on SCr of 6.88 mg/dL (H)). Recent Labs  Lab 01/07/2022 1857 01/19/22 0918 01/20/22 0417  WBC 11.3* 14.0* 17.7*    Liver Function Tests: Recent Labs  Lab 12/28/2021 1857 01/19/22 0918 01/20/22 0417  AST 39 50* 79*  ALT 22 27 39  ALKPHOS 132* 119 126  BILITOT 0.8 0.8 0.8  PROT 7.6 7.1 7.4  ALBUMIN 3.5 3.3* 3.6   No results for input(s): LIPASE, AMYLASE in the last 168 hours. Recent Labs  Lab 01/19/22 0918  AMMONIA 16    ABG    Component Value Date/Time   HCO3 16.2 (L) 01/23/2022 2030   ACIDBASEDEF 9.3 (H) 01/04/2022 2030   O2SAT 83.8 12/30/2021 2030     Coagulation Profile: No results for input(s): INR, PROTIME in the last 168 hours.  Cardiac Enzymes: No results for input(s): CKTOTAL, CKMB, CKMBINDEX, TROPONINI in the last 168 hours.  HbA1C: Hgb A1c MFr Bld  Date/Time Value Ref Range Status  01/18/2022 06:25 AM 9.2 (H) 4.8 - 5.6 % Final    Comment:    (NOTE)         Prediabetes: 5.7 - 6.4         Diabetes: >6.4         Glycemic control for adults with diabetes: <7.0     CBG: Recent Labs  Lab 01/19/22 1147 01/19/22 1615 01/19/22 2027 01/20/22 0803 01/20/22 0838  GLUCAP 168* 139* 199* 227* 215*    Review of Systems:   Unable to assess due to AMS  Past Medical History:  She,  has a past medical history of Cancer ([place]), Diabetes mellitus without complication ([place]), Hypertension, Hypothyroidism, Renal disorder, and Sleep apnea.   Surgical History:   Past Surgical History:  Procedure Laterality Date   TUMOR REMOVAL     endometrial     Social History:   reports that she has never smoked. She has never been exposed to tobacco smoke. She has never used smokeless tobacco. She reports that she  does not drink alcohol and does not use drugs.   Family History:  Her family history is not on file.   Allergies Allergies  Allergen Reactions   Amoxicillin    Augmentin [Amoxicillin-Pot Clavulanate]    Ceftin [Cefuroxime]    Compazine [Prochlorperazine]    Demerol [Meperidine Hcl]    Macrobid [Nitrofurantoin]    Phenergan [Promethazine]    Sulfa Antibiotics      Home Medications  Prior to Admission medications   Medication Sig Start Date End Date Taking? Authorizing Provider  amLODipine (NORVASC) 10 MG tablet Take 10 mg by mouth daily. 11/27/21   [provider]  calcitRIOL (ROCALTROL) 0.5 MCG capsule SMARTSIG:1 Capsule(s) By Mouth Daily on Weekdays 12/20/21   [provider]  famotidine (PEPCID) 40 MG tablet SMARTSIG:1 Tablet(s) By Mouth Morning-Evening 12/19/21   [provider]  fluconazole (DIFLUCAN) 200 MG tablet Take 200 mg by mouth every other day. 01/03/22   [provider]  gabapentin (NEURONTIN) 300 MG capsule Take 300 mg by mouth 2 (two) times daily as needed. 11/22/21   [provider]  HUMALOG KWIKPEN 100 UNIT/ML KwikPen Inject into the skin. 01/05/22   [provider]  LANTUS SOLOSTAR 100 UNIT/ML Solostar Pen  SMARTSIG:100 Unit(s) SUB-Q Daily 12/16/21   [provider]  levothyroxine (SYNTHROID) 200 MCG tablet Take 200 mcg by mouth daily. 01/16/22   [provider]  levothyroxine (SYNTHROID) 50 MCG tablet Take 50 mcg by mouth daily. 10/28/21   [provider]  lovastatin (MEVACOR) 10 MG tablet Take 10 mg by mouth daily. 01/15/22   [provider]  OZEMPIC, 1 MG/DOSE, 4 MG/3ML SOPN Inject 1 mg into the skin once a week. 11/21/21   [provider]  sevelamer carbonate (RENVELA) 800 MG tablet Take 800 mg by mouth 4 (four) times daily. 12/14/21   [provider]     Critical care time: 60 minutes     Darel Hong, AGACNP-BC [facility] [place]  epic messenger for cross cover needs If after hours, please call E-link

## 2022-01-20 NOTE — Progress Notes (Signed)
Initial Nutrition Assessment  DOCUMENTATION CODES:   Obesity unspecified  INTERVENTION:   Vital HP @20ml /hr + Pro-Source TF 71ml QID via tube   Propofol: 32.4 ml/hr- provides 855kcal/day   Free water flushes 72ml q4 hours to maintain tube patency   Regimen provides 1655kcal/day, 130g/day protein and 553ml/day of free water   Pt at high refeed risk; recommend monitor potassium, magnesium and phosphorus labs daily until stable  Liquid MVI daily via tube   Rena-vit daily via tube   NUTRITION DIAGNOSIS:   Inadequate oral intake related to inability to eat (pt sedated and ventilated) as evidenced by NPO status.  GOAL:   Provide needs based on ASPEN/SCCM guidelines  MONITOR:   Vent status, Labs, Weight trends, Skin, I & O's  REASON FOR ASSESSMENT:   Ventilator    ASSESSMENT:   66 y/o female with h/o ESRD on HD, anemia of chronic disease, DM, hypothyroidism, s/p radioactive iodine thyroid ablation, endometrial cancer and HTN who is admitted with AMS and myxedema coma.  Pt sedated and ventilated. OGT in place. Plan is to start tube feeds today. Pt is for HD later today. Per chart, pt appears to be down ~41lbs(15%) since November; RD unsure if admit weight is correct.   Medications reviewed and include: epoetin, heparin, insulin, synthroid, protonix, propofol, thiamine   Labs reviewed: K 5.3(H), BUN 63(H), creat 7.37(H), Mg 2.6(H) Wbc- 17.7(H), Hgb 9.1(L), Hct 29.3(L) Cbgs- 214, 210, 215, 227 x 24 hrs AIC 9.2(H)- 1/25  Patient is currently intubated on ventilator support MV: 9.7 L/min Temp (24hrs), Avg:97.7 F (36.5 C), Min:97.4 F (36.3 C), Max:97.8 F (36.6 C)  Propofol: 32.4 ml/hr- provides 855kcal/day   MAP- >51mmHg   UOP- 67ml   NUTRITION - FOCUSED PHYSICAL EXAM:  Flowsheet Row Most Recent Value  Orbital Region No depletion  Upper Arm Region No depletion  Thoracic and Lumbar Region No depletion  Buccal Region No depletion  Temple Region Moderate  depletion  Clavicle Bone Region No depletion  Clavicle and Acromion Bone Region No depletion  Scapular Bone Region No depletion  Dorsal Hand No depletion  Patellar Region No depletion  Anterior Thigh Region No depletion  Posterior Calf Region No depletion  Edema (RD Assessment) None  Hair Reviewed  Eyes Reviewed  Mouth Reviewed  Skin Reviewed  Nails Reviewed   Diet Order:   Diet Order             Diet NPO time specified  Diet effective now                  EDUCATION NEEDS:   No education needs have been identified at this time  Skin:  Skin Assessment: Reviewed RN Assessment  Last BM:  pta  Height:   Ht Readings from Last 1 Encounters:  01/20/22 5' 7.52" (1.715 m)    Weight:   Wt Readings from Last 1 Encounters:  01/20/22 108.1 kg    Ideal Body Weight:  63.6 kg  BMI:  Body mass index is 36.75 kg/m.  Estimated Nutritional Needs:   Kcal:  1188-1512kcal/day  Protein:  >127g/day  Fluid:  1.9-2.2L/day  Koleen Distance MS, RD, LDN Please refer to [facility] for RD and/or RD on-call/weekend/after hours pager

## 2022-01-20 NOTE — Procedures (Signed)
Central Venous Catheter Insertion Procedure Note Regina Jackson [id number] [birth year]  Procedure: Insertion of Central Venous Catheter Indications: Assessment of intravascular volume, Drug and/or fluid administration, and Frequent blood sampling  Procedure Details Consent: Unable to obtain consent because of emergent medical necessity. Time Out: Verified patient identification, verified procedure, site/side was marked, verified correct patient position, special equipment/implants available, medications/allergies/relevent history reviewed, required imaging and test results available.  Performed  Maximum sterile technique was used including antiseptics, cap, gloves, gown, hand hygiene, mask, and sheet. Skin prep: Chlorhexidine; local anesthetic administered A antimicrobial bonded/coated triple lumen catheter was placed in the left internal jugular vein using the Seldinger technique.  Evaluation Blood flow good Complications: No apparent complications Patient did tolerate procedure well. Chest X-ray ordered to verify placement.  CXR: pending.    Line secured at the 20 cm mark. BIOPATCH applied to the insertion site.  Regina Jackson, AGACNP-BC [facility] Prefer epic messenger for cross cover needs If after hours, please call E-link  Regina Jackson 01/20/2022, 10:05 AM

## 2022-01-20 NOTE — TOC Initial Note (Signed)
Transition of Care [facility]) - Initial/Assessment Note    Patient Details  Name: Regina Jackson MRN: [id number] Date of Birth: [birth year]  Transition of Care [facility]) CM/SW Contact:    Shelbie Hutching, RN Phone Number: 01/20/2022, 1:21 PM  Clinical Narrative:                 Patient admitted to the hospital with acute metabolic encephalopathy.  Patient required intubation this morning in the ICU.   Patient is from home with her husband and she does home hemodialysis.  Per report patient missed several treatments at home due to just not wanting to do them.  TOC will follow for discharge planning.     Expected Discharge Plan:  (TBD) Barriers to Discharge: Continued Medical Work up   Patient Goals and CMS Choice Patient states their goals for this hospitalization and ongoing recovery are:: patient intubated and sedated      Expected Discharge Plan and Services Expected Discharge Plan:  (TBD)   Discharge Planning Services: CM Consult   Living arrangements for the past 2 months: Single Family Home                 DME Arranged: N/A DME Agency: NA                  Prior Living Arrangements/Services Living arrangements for the past 2 months: Single Family Home Lives with:: Spouse Patient language and need for interpreter reviewed:: Yes        Need for Family Participation in Patient Care: Yes (Comment) Care giver support system in place?: Yes (comment) (husband) Current home services: DME (home hemodialysis) Criminal Activity/Legal Involvement Pertinent to Current Situation/Hospitalization: No - Comment as needed  Activities of Daily Living Home Assistive Devices/Equipment: None ADL Screening (condition at time of admission) Patient's cognitive ability adequate to safely complete daily activities?: No Is the patient deaf or have difficulty hearing?: No Does the patient have difficulty seeing, even when wearing glasses/contacts?: No Does the patient have difficulty  concentrating, remembering, or making decisions?: Yes Patient able to express need for assistance with ADLs?: No Does the patient have difficulty dressing or bathing?: Yes Independently performs ADLs?: No Does the patient have difficulty walking or climbing stairs?: Yes Weakness of Legs: Both Weakness of Arms/Hands: None  Permission Sought/Granted                  Emotional Assessment Appearance:: Appears older than stated age Attitude/Demeanor/Rapport: Intubated (Following Commands or Not Following Commands) Affect (typically observed): Unable to Assess   Alcohol / Substance Use: Not Applicable Psych Involvement: Yes (comment)  Admission diagnosis:  Acute metabolic encephalopathy [J88.41] Patient Active Problem List   Diagnosis Date Noted   Chronic home hemodialysis status ([place]) 01/08/2022   Hypertensive urgency 66/05/3015   Acute metabolic encephalopathy 01/02/3234   Non-compliance with renal dialysis ([place]) 12/28/2021   Anemia of chronic kidney failure, stage 5 ([place]) 01/15/2022   Elevated troponin 12/27/2021   Fluid overload 01/19/2022   Involuntary commitment 01/22/2022   Endometrial cancer ([place]) 01/08/2022   H/O radioactive iodine thyroid ablation 01/11/2022   Essential (primary) hypertension 11/29/2018   Obstructive sleep apnea 11/27/2017   Type 2 diabetes mellitus with kidney complication, with long-term current use of insulin ([place]) 08/04/2017   Hypothyroidism 08/04/2017   PCP:  System, Provider Not In Pharmacy:   [id number] [address], [place] [address] Phone: [phone] Fax: [phone]  Social Determinants of Health (SDOH) Interventions    Readmission Risk Interventions No flowsheet data found.

## 2022-01-20 NOTE — Progress Notes (Signed)
Patient was transferred to the ICU this morning. She was found to have severe bradycardia and altered mental status. She was intubated and placed on mechanical ventilation. Case discussed with Dr. Patsey Berthold, intensivist, who has graciously accepted the patient in transfer.

## 2022-01-20 NOTE — Progress Notes (Signed)
Patient received from Room 231 via bed accompanied by RN and NCA. Opens eyes to voice but does not follow commands. Thrashes in bed. Increased work of breathing with rate 32-38, with sat 95 on 2 liters. Dr. Patsey Berthold and Darel Hong in to evaluate patient. Arrangements being made to intubate patient.

## 2022-01-20 NOTE — Consult Note (Signed)
NEURO HOSPITALIST CONSULT NOTE   Requestig physician: Dr. Lurlean Nanny  Reason for Consult: Persisting AMS   History obtained from:   Chart     HPI:                                                                                                                                          Regina Jackson is an 66 y.o. female with a past medical history of end-stage renal disease on hemodialysis at home 3 times a week, anemia of CKD, endometrial CA, hypothyroidism secondary to RAI (radioactive iodine), HTN and DM  who was admitted to the hospital on Tuesday with AMS in the context of failure to keep up with dialysis. Husband was concerned as she had become increasingly agitated over this time. She had been having increasing AMS x 48 hours at home. She had not had her home hemodialysis in about 1 week. She denied any complaints on arrival to the ED but was confused and only oriented to self. On arrival, BP was 204/107, pulse 96 with otherwise normal vitals. COVID and influenza PCR were negative. CT head showed no acute abnormality. She has continued to have AMS this admission.   Overnight, she continued to be agitated, thrashing in bed with unintelligible speech and inability to follow commands. Of note, PRN IV Haldol and Valium had been given during the overnight shift with little improvement of her restlessness.Marland Kitchen She was able to open her eyes to voice but could not follow commands. Severe bradycardia was also noted in addition to increased WOB with rate of 32-38, O2 sat of 95% on 2L. She was intubated and transferred to the ICU for further management. Neurology was consulted for further evaluation of persistent AMS not resolving despite dialysis.   Psychiatry was consulted yesterday, noting the following: "Came to see the patient this evening found her with a sitter who is struggling to keep the patient in bed.  Patient was rolling over and crawling out of bed but not with any evidence of  any intent.  Patient was not able to follow any commands.  Seemed to have no understanding that another person was there were trying to assist her.  Vocalizing only grunting noises.  Staring off into space.  Labs reviewed.  Multiple abnormalities including elevated white count still, elevated BUN and creatinine of course, extremely elevated TSH indicating nontreatment of her thyroid outside the hospital." Psychiatry recommendations included the following: " Unquestionably having metabolic abnormalities due to failure to keep up with dialysis.  Also possible contribution of what looks like severe hypothyroidism.  Patient remains agitated at this time.  Head CT unrevealing.  Goal of treatment would be to relieve all possible medical conditions that could be contributing while keeping the patient safe.  She already  has a Actuary.  I made a couple alterations to the medicine.  Discontinued Seroquel and preference for Risperdal M tabs half milligram 3 times a day which I think are probably more likely to be helpful with the acute agitation and behavior.  Also changed Haloperidol order to allow for IV administration.  It was only ordered as IM and I am not sure if that is because she did not have an IV in place at some point but this way both options are available and the IV is generally much quicker and more effective.  In general we try to avoid benzodiazepines for delirium however if it proves to be particularly helpful in controlling her dangerous behavior empirically then that is fine."   Past Medical History:  Diagnosis Date   Cancer ([place])    Diabetes mellitus without complication ([place])    Hypertension    Hypothyroidism    Renal disorder    Sleep apnea     Past Surgical History:  Procedure Laterality Date   TUMOR REMOVAL     endometrial    History reviewed. No pertinent family history.            Social History:  reports that she has never smoked. She has never been exposed to tobacco smoke. She  has never used smokeless tobacco. She reports that she does not drink alcohol and does not use drugs.  Allergies  Allergen Reactions   Amoxicillin    Augmentin [Amoxicillin-Pot Clavulanate]    Ceftin [Cefuroxime]    Compazine [Prochlorperazine]    Demerol [Meperidine Hcl]    Macrobid [Nitrofurantoin]    Phenergan [Promethazine]    Sulfa Antibiotics     MEDICATIONS:                                                                                                                     Prior to Admission:  Medications Prior to Admission  Medication Sig Dispense Refill Last Dose   amLODipine (NORVASC) 10 MG tablet Take 10 mg by mouth daily.      calcitRIOL (ROCALTROL) 0.5 MCG capsule SMARTSIG:1 Capsule(s) By Mouth Daily on Weekdays      famotidine (PEPCID) 40 MG tablet SMARTSIG:1 Tablet(s) By Mouth Morning-Evening      fluconazole (DIFLUCAN) 200 MG tablet Take 200 mg by mouth every other day.      gabapentin (NEURONTIN) 300 MG capsule Take 300 mg by mouth 2 (two) times daily as needed.      HUMALOG KWIKPEN 100 UNIT/ML KwikPen Inject into the skin.      LANTUS SOLOSTAR 100 UNIT/ML Solostar Pen SMARTSIG:100 Unit(s) SUB-Q Daily      levothyroxine (SYNTHROID) 200 MCG tablet Take 200 mcg by mouth daily.      levothyroxine (SYNTHROID) 50 MCG tablet Take 50 mcg by mouth daily.      lovastatin (MEVACOR) 10 MG tablet Take 10 mg by mouth daily.      OZEMPIC, 1 MG/DOSE, 4 MG/3ML SOPN Inject 1 mg into the skin once  a week.      sevelamer carbonate (RENVELA) 800 MG tablet Take 800 mg by mouth 4 (four) times daily.      Scheduled:  albuterol  2.5 mg Nebulization Q6H   amLODipine  10 mg Oral Daily   chlorhexidine gluconate (MEDLINE KIT)  15 mL Mouth Rinse BID   Chlorhexidine Gluconate Cloth  6 each Topical Q0600   [START ON 01/23/2022] epoetin (EPOGEN/PROCRIT) injection  4,000 Units Intravenous Q M,W,F-HD   feeding supplement (PROSource TF)  90 mL Per Tube QID   feeding supplement (VITAL HIGH PROTEIN)   1,000 mL Per Tube Q24H   free water  30 mL Per Tube Q4H   heparin  5,000 Units Subcutaneous Q8H   hydrALAZINE  25 mg Oral Q8H   insulin aspart  0-15 Units Subcutaneous Q4H   levothyroxine  100 mcg Intravenous Daily   mouth rinse  15 mL Mouth Rinse 10 times per day   multivitamin  1 tablet Per Tube QHS   [START ON 01/21/2022] multivitamin  15 mL Per Tube Daily   pantoprazole (PROTONIX) IV  40 mg Intravenous Daily   risperiDONE  0.5 mg Oral TID   rOPINIRole  0.25 mg Oral QHS   Continuous:  sodium chloride     sodium chloride     sodium chloride Stopped (01/20/22 1400)   fentaNYL infusion INTRAVENOUS 150 mcg/hr (01/20/22 1815)   norepinephrine (LEVOPHED) Adult infusion 4 mcg/min (01/20/22 1815)   propofol (DIPRIVAN) infusion 30 mcg/kg/min (01/20/22 1815)   thiamine injection Stopped (01/20/22 1345)     ROS:                                                                                                                                       Unable to obtain due to intubation and sedation.  Blood pressure (!) 117/57, pulse 73, temperature (!) 97.4 F (36.3 C), temperature source Axillary, resp. rate 20, height 5' 7.52" (1.715 m), weight 108.1 kg, SpO2 99 %.   General Examination:                                                                                                       Physical Exam  HEENT-  [place]/AT. Neck is supple.   Lungs- Intubated and sedated    Extremities- Warm and well perfused   Neurological Examination Mental Status: Intubated and sedated on propofol at a rate of 30 and fentanyl at a rate of 150. Does not respond to loud clapping or  calling of her name in the sedated state. No purposeful movements, but does exhibit slight bilaterally symmetric posturing to noxious. No eye opening in the context of sedation.  Cranial Nerves: II: Pupils almost pinpoint and minimally reactive (on sedation). No blink to threat.  III,IV, VI: Eyes midline without doll's eye reflex on  sedation. V,VII: Weak corneals bilaterally   VIII: No response to voice IX,X: Intubated XI: Head is midline.  XII: Intubated Motor/Sensory: Flaccid tone x 4 at rest.  Slight elevation of shoulders with internal rotation of arms to sternal rub without asymmetry Brisk internal rotation of BLE to thigh pinch and plantar stimulation, without asymmetry Deep Tendon Reflexes: Hypoactive in the context of sedation. Toes upgoing bilaterally  Cerebellar/Gait: Unable to assess    Lab Results: Basic Metabolic Panel: Recent Labs  Lab 01/01/2022 1857 01/19/22 0918 01/20/22 0417 01/20/22 1058  NA 134* 138 142 140  K 4.4 4.7 5.0 5.3*  CL 105 110 112* 112*  CO2 16* 13* 13* 16*  GLUCOSE 211* 188* 182* 233*  BUN 49* 53* 61* 63*  CREATININE 5.90* 6.39* 6.88* 7.37*  CALCIUM 9.7 9.6 9.6 8.8*  MG  --   --   --  2.6*    CBC: Recent Labs  Lab 01/19/2022 1857 01/19/22 0918 01/20/22 0417 01/20/22 1058  WBC 11.3* 14.0* 17.7* 17.7*  NEUTROABS 8.9* 12.1* 15.3*  --   HGB 10.9* 10.2* 9.9* 9.1*  HCT 33.7* 31.6* 31.5* 29.3*  MCV 92.1 91.1 92.6 95.1  PLT 281 260 285 243    Cardiac Enzymes: No results for input(s): CKTOTAL, CKMB, CKMBINDEX, TROPONINI in the last 168 hours.  Lipid Panel: No results for input(s): CHOL, TRIG, HDL, CHOLHDL, VLDL, LDLCALC in the last 168 hours.  Imaging: DG Abd 1 View  Result Date: 01/20/2022 CLINICAL DATA:  Nasogastric tube placement. EXAM: ABDOMEN - 1 VIEW COMPARISON:  Chest radiographs today and 01/05/2022. FINDINGS: 1026 hours. Nasogastric tube projects below the diaphragm, tip projected over the mid stomach. The visualized bowel gas pattern is normal. Asymmetric left basilar pulmonary opacity, likely atelectasis with a possible small pleural effusion. IMPRESSION: Nasogastric tube projects over the mid stomach. Electronically Signed   By: Richardean Sale M.D.   On: 01/20/2022 10:49   Portable Chest x-ray  Result Date: 01/20/2022 CLINICAL DATA:  Central line  placement. Endotracheal tube placement. EXAM: PORTABLE CHEST 1 VIEW COMPARISON:  Radiographs 12/30/2021 FINDINGS: 1027 hours. Endotracheal tube tip overlies the mid trachea. There is a new left IJ central venous catheter which has an atypical course, projecting over the aortic arch with its tip overlying the right mainstem bronchus. A nasogastric tube projects below the diaphragm, tip not visualized on this examination. The heart size and mediastinal contours are stable allowing for mild patient rotation. No evidence of mediastinal hematoma. There are increased opacities at both lung bases, likely atelectasis. There is a small left pleural effusion. No evidence of pneumothorax. IMPRESSION: 1. New left IJ central venous catheter follows an atypical course and cannot be confirmed to be intravenous by a single AP radiograph. Tip overlies the right mainstem bronchus. Correlate for venous blood return from the catheter. 2. Satisfactory position of the endotracheal tube. 3. Increased bibasilar atelectasis.  No pneumothorax. 4. These results will be called to the ordering clinician or representative by the Radiologist Assistant, and communication documented in the PACS or [employer]. Electronically Signed   By: Richardean Sale M.D.   On: 01/20/2022 10:53     Assessment: 66 year old female  with persistent AMS since admission on Tuesday.  1. Exam reveals findings consistent with diffuse cerebral hypofunction in the context of sedation. Possible bihemispheric or brainstem lesion cannot be ruled out.  2. Agree with Psychiatry that metabolic abnormalities due to failure to keep up with dialysis are most likely contributing to the patient's AMS. Also agree with possible contribution from severe hypothyroidism.   3. Head CT: No acute intracranial abnormality. Mild chronic small vessel ischemia    Recommendations: 1. EEG (ordered) 2. Agree with Psychiatry that goal of treatment would be to relieve all possible  medical conditions that could be contributing. 3. Agree with Psychiatry medication recommendations.  4. MRI brain without contrast.   35 minutes spent in the neurological evaluation and management of this critically ill patient  Electronically signed: Dr. Kerney Elbe 01/20/2022, 2:22 PM

## 2022-01-20 NOTE — Progress Notes (Signed)
Eeg done 

## 2022-01-20 NOTE — Procedures (Signed)
Pt having dialyisis for an hour 26 min-do after and before MRI per Dr. Cheral Marker

## 2022-01-20 NOTE — Progress Notes (Addendum)
HD notes:  14:27 PT started Hemodialysis treatment  with  BP of 122/60, However, at 15:02, BP drop to 91/53, UF off , DR Candiss Norse as notified with order to give 300cc NSS bolus. HD continue, closely monitored pt. Bp drop again at 15:34 to 79/46, 250 cc NSS bolus was given, ICU nurse informed, pt placed on [place] position, ICU nurse started Norepineprine IV. BP increased to 118/57.DR sing was notified with order to maintain UF off.  HD completed. Total time 3 hours. No fluid removal.

## 2022-01-20 NOTE — Progress Notes (Addendum)
Patient continues to thrash about in bed, with unintelligible speech and unable to follow commands. PRN IV Haldol and Valium given this shift with little improvement with restlessness. Sharion Settler, FNP notified. Additional one time order for 5mg  IV valium given.

## 2022-01-20 NOTE — Procedures (Signed)
Endotracheal Intubation: Patient required placement of an artificial airway secondary to status with respiratory distress due to metabolic acidosis, inability to protect airway.  Consent: Emergent.  Attempted to reach patient's spouse and DPOA however was not able to reach.  Situation was emergent.  Hand washing performed prior to starting the procedure.   Medications administered for sedation prior to procedure:  Etomidate 20 mg IV x1 Fentanyl 100 mcg IV x1 Rocuronium 50 mg IV x1 Versed 2 mg IV x 1   A time out procedure was called and correct patient, name, & ID confirmed. Needed supplies and equipment were assembled and checked to include ETT, 10 ml syringe, Glidescope, Mac and Miller blades, suction, oxygen and bag mask valve, end tidal CO2 monitor.   Patient was positioned to align the mouth and pharynx to facilitate visualization of the glottis.   Heart rate, SpO2 and blood pressure was continuously monitored during the procedure. Pre-oxygenation was conducted prior to intubation and endotracheal tube was placed through the vocal cords into the trachea.   First attempt was made by Darel Hong, NP.  Patient's airway was DIFFICULT, anterior, airway was caked with dried secretions.  Visibility was poor.  ET tube was unable to be placed at that time, the patient was then bagged again with 100% FiO2.  Heart rate transiently dropped to the 30s without loss of blood pressure patient received atropine x1.  Once her heart rate stabilized we proceeded with intubation.  At this point I introduced a number 7.5 ETT on the first attempt utilizing a glide a scope with a #4 blade. ETT was secured at 24 cm mark at the teeth.  Placement was confirmed by auscuitation of lungs with good breath sounds bilaterally and no gastric sounds.  Condensation was noted on endotracheal tube.  Pulse ox 99%.  CO2 detector in place with appropriate color change.   Complications: None patient tolerated the procedure  well.   Operator: Patsey Berthold  Chest radiograph ordered and showed appropriate position of the ET tube, no pneumothorax    C. Derrill Kay, MD Advanced [facility]ch[facility]    *This note was dictated using voice recognition software/Dragon.  Despite best efforts to proofread, errors can occur which can change the meaning. Any transcriptional errors that result from this process are unintentional and may not be fully corrected at the time of dictation.

## 2022-01-21 ENCOUNTER — Inpatient Hospital Stay: Payer: BLUE CROSS/BLUE SHIELD

## 2022-01-21 DIAGNOSIS — I639 Cerebral infarction, unspecified: Secondary | ICD-10-CM | POA: Diagnosis not present

## 2022-01-21 DIAGNOSIS — G9341 Metabolic encephalopathy: Secondary | ICD-10-CM | POA: Diagnosis not present

## 2022-01-21 LAB — RENAL FUNCTION PANEL
Albumin: 2.9 g/dL — ABNORMAL LOW (ref 3.5–5.0)
Anion gap: 8 (ref 5–15)
BUN: 33 mg/dL — ABNORMAL HIGH (ref 8–23)
CO2: 24 mmol/L (ref 22–32)
Calcium: 8.5 mg/dL — ABNORMAL LOW (ref 8.9–10.3)
Chloride: 105 mmol/L (ref 98–111)
Creatinine, Ser: 4.95 mg/dL — ABNORMAL HIGH (ref 0.44–1.00)
GFR, Estimated: 9 mL/min — ABNORMAL LOW (ref >60)
Glucose, Bld: 154 mg/dL — ABNORMAL HIGH (ref 70–99)
Phosphorus: 4.5 mg/dL (ref 2.5–4.6)
Potassium: 3.6 mmol/L (ref 3.5–5.1)
Sodium: 137 mmol/L (ref 135–145)

## 2022-01-21 LAB — MAGNESIUM: Magnesium: 1.8 mg/dL (ref 1.7–2.4)

## 2022-01-21 LAB — GLUCOSE, CAPILLARY
Glucose-Capillary: 125 mg/dL — ABNORMAL HIGH (ref 70–99)
Glucose-Capillary: 135 mg/dL — ABNORMAL HIGH (ref 70–99)
Glucose-Capillary: 149 mg/dL — ABNORMAL HIGH (ref 70–99)
Glucose-Capillary: 152 mg/dL — ABNORMAL HIGH (ref 70–99)
Glucose-Capillary: 163 mg/dL — ABNORMAL HIGH (ref 70–99)
Glucose-Capillary: 183 mg/dL — ABNORMAL HIGH (ref 70–99)

## 2022-01-21 LAB — PROCALCITONIN: Procalcitonin: 0.8 ng/mL

## 2022-01-21 LAB — CBC
HCT: 27.7 % — ABNORMAL LOW (ref 36.0–46.0)
Hemoglobin: 8.5 g/dL — ABNORMAL LOW (ref 12.0–15.0)
MCH: 29.5 pg (ref 26.0–34.0)
MCHC: 30.7 g/dL (ref 30.0–36.0)
MCV: 96.2 fL (ref 80.0–100.0)
Platelets: 216 10*3/uL (ref 150–400)
RBC: 2.88 MIL/uL — ABNORMAL LOW (ref 3.87–5.11)
RDW: 14 % (ref 11.5–15.5)
WBC: 15.1 10*3/uL — ABNORMAL HIGH (ref 4.0–10.5)
nRBC: 0 % (ref 0.0–0.2)

## 2022-01-21 LAB — PARATHYROID HORMONE, INTACT (NO CA): PTH: 492 pg/mL — ABNORMAL HIGH (ref 15–65)

## 2022-01-21 LAB — TRIGLYCERIDES: Triglycerides: 251 mg/dL — ABNORMAL HIGH (ref <150)

## 2022-01-21 IMAGING — DX DG CHEST 1V PORT
1 series · 1 of 1 positions shown · non-contrast
Comparison: Film from the previous day.

CLINICAL DATA: Acute respiratory failure

EXAM:
PORTABLE CHEST 1 VIEW

[chest ap]
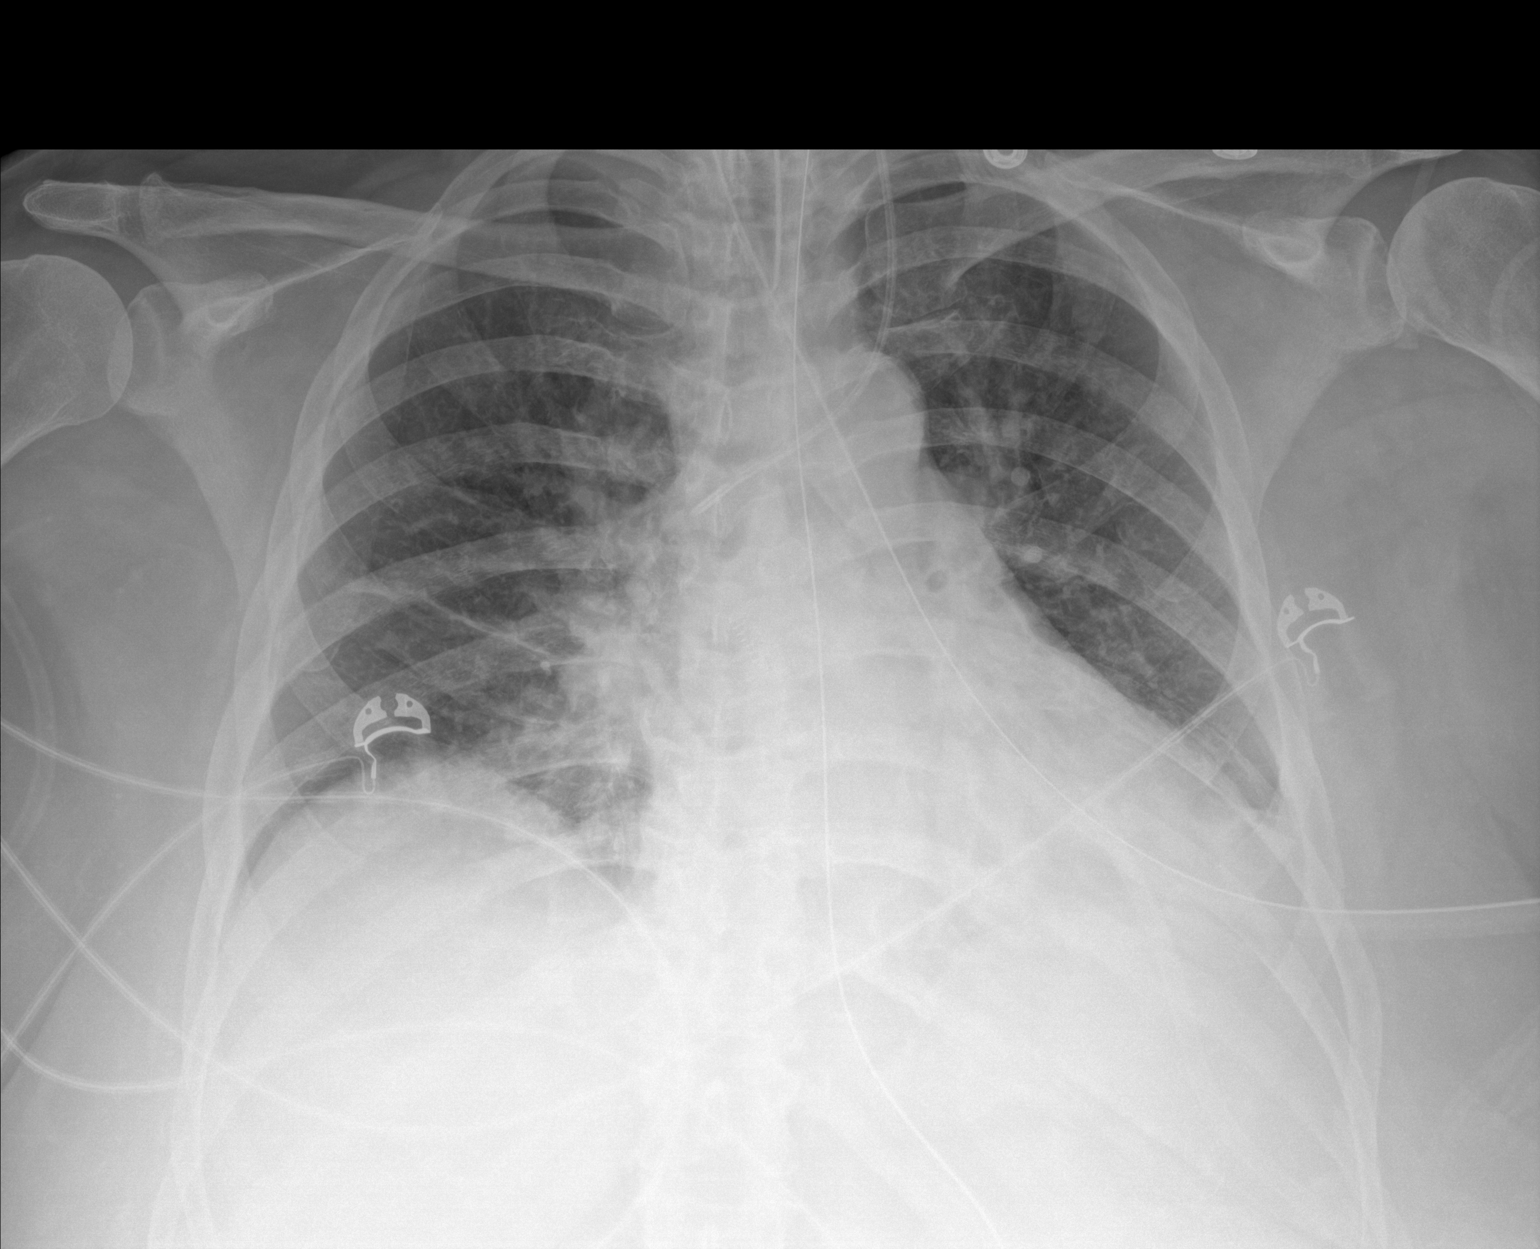

[1 of 1 positions shown; findings below may reference images not displayed]

FINDINGS: Endotracheal tube, gastric catheter and left jugular central line
are again seen and stable. Cardiac shadow is stable. Lungs are well
aerated bilaterally with mild basilar atelectasis. No other focal
abnormality is noted.
IMPRESSION: Mild bibasilar atelectasis.

Tubes and lines in satisfactory position.

## 2022-01-21 IMAGING — MR MR HEAD W/O CM
11 series · 48 of 48 positions shown · non-contrast
Comparison: CT head [DATE]

CLINICAL DATA: Altered mental status

EXAM:
MRI HEAD WITHOUT CONTRAST
TECHNIQUE: Multiplanar, multiecho pulse sequences of the brain and surrounding
structures were obtained without intravenous contrast.

[Series 5: ax dwi_tracew · axial · 3.0mm · 1.80mm/px · z∈[-79,+62]mm · 5 of 48 slices shown]
[im 1/48]
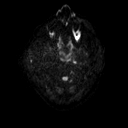
[im 12/48]
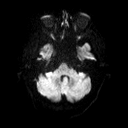
[im 24/48]
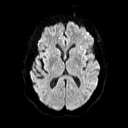
[im 36/48]
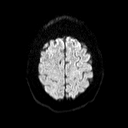
[im 48/48]
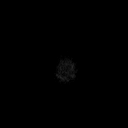

[Series 6: ax dwi_adc · axial · 3.0mm · 1.80mm/px · z∈[-79,+62]mm · 4 of 48 slices shown]
[im 1/48]
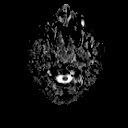
[im 16/48]
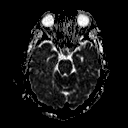
[im 32/48]
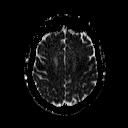
[im 48/48]
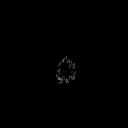

[Series 7: cor dwi_tracew · coronal · 5.0mm · 1.80mm/px · 3 of 38 slices shown]
[im 1/38]
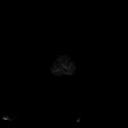
[im 19/38]
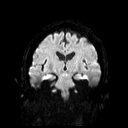
[im 38/38]
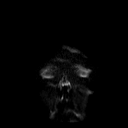

[Series 8: cor dwi_adc · coronal · 5.0mm · 1.80mm/px · 3 of 38 slices shown]
[im 1/38]
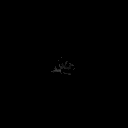
[im 19/38]
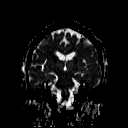
[im 38/38]
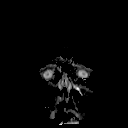

[Series 9: T1 · sagittal · 5.0mm · 0.62mm/px · 2 of 21 slices shown (1 of 2)]
[im 1/21]
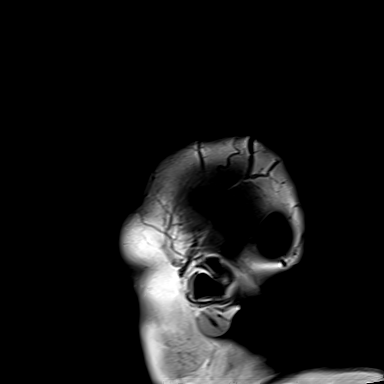
[im 21/21]
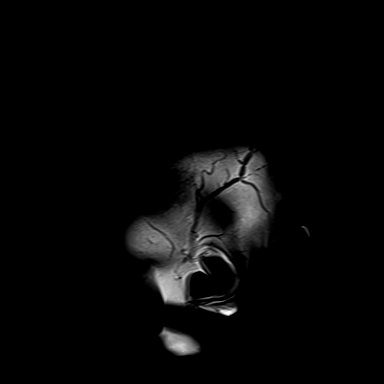

[Series 10: T2 · axial · 5.0mm · 0.86mm/px · z∈[-81,+61]mm · 2 of 27 slices shown (1 of 2)]
[im 1/27]
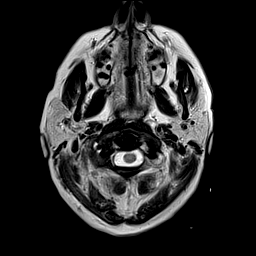
[im 27/27]
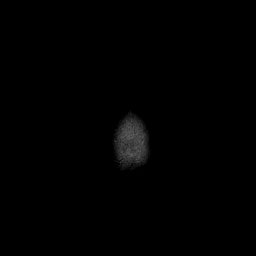

[Series 12: pha_images · axial · 3.0mm · 0.90mm/px · z∈[-77,+63]mm · 4 of 52 slices shown]
[im 1/52]
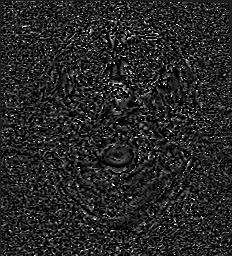
[im 18/52]
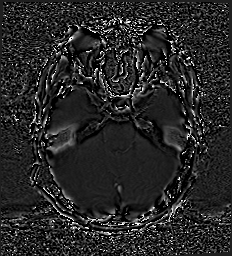
[im 35/52]
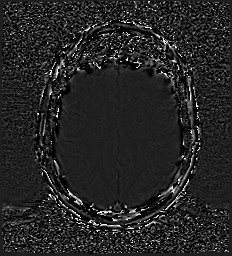
[im 52/52]
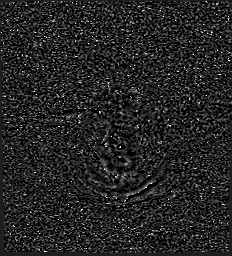

[Series 13: swi_images · axial · 3.0mm · 0.90mm/px · z∈[-77,+63]mm · 4 of 52 slices shown]
[im 1/52]
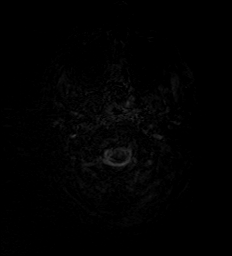
[im 18/52]
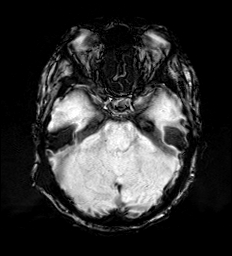
[im 35/52]
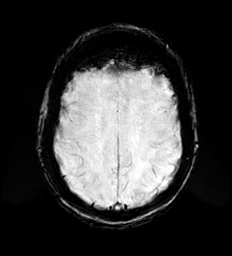
[im 52/52]
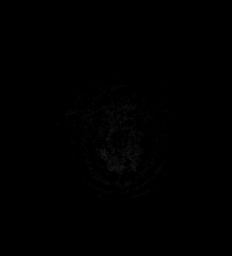

[Series 15: FLAIR · axial · 3.0mm · 0.69mm/px · z∈[-84,+64]mm · 5 of 55 slices shown]
[im 1/55]
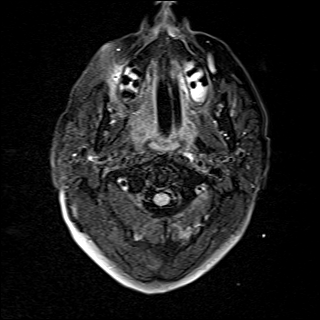
[im 14/55]
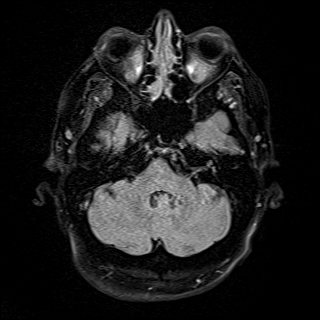
[im 28/55]
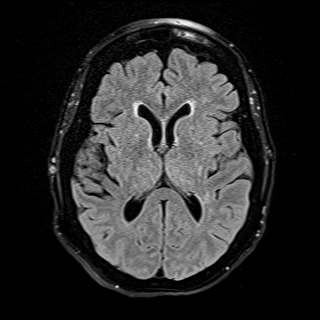
[im 41/55]
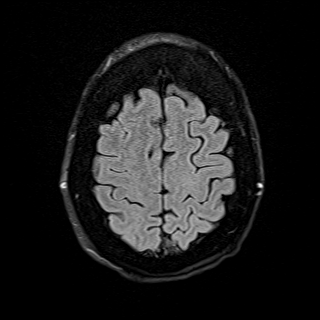
[im 55/55]
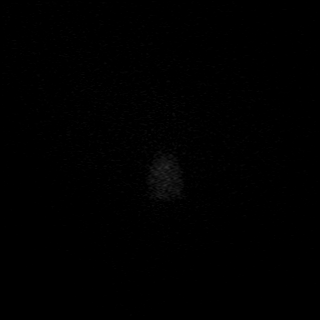

[Series 16: T1 · axial · 1.0mm · 0.98mm/px · z∈[-77,+68]mm · 14 of 160 slices shown (2 of 2)]
[im 1/160]
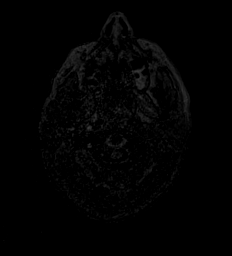
[im 13/160]
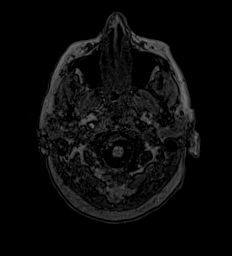
[im 25/160]
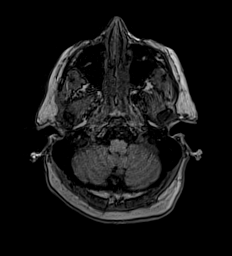
[im 37/160]
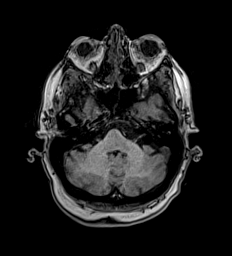
[im 49/160]
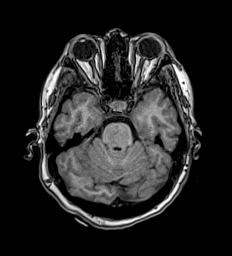
[im 62/160]
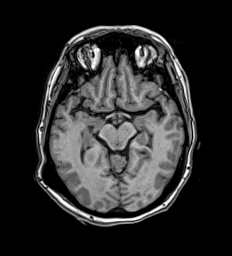
[im 74/160]
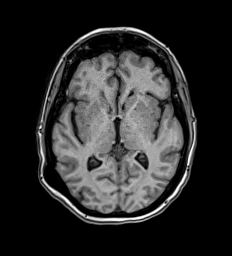
[im 86/160]
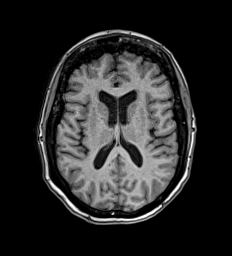
[im 98/160]
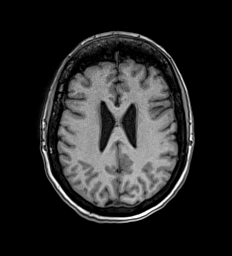
[im 111/160]
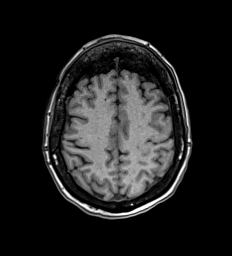
[im 123/160]
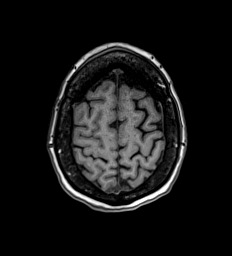
[im 135/160]
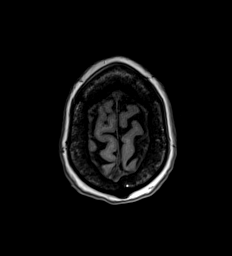
[im 147/160]
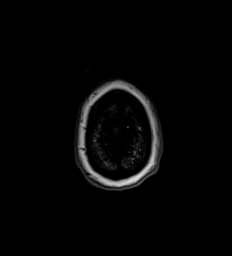
[im 160/160]
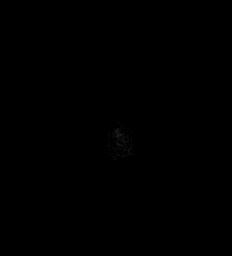

[Series 17: T2 · coronal · 5.0mm · 0.86mm/px · 2 of 29 slices shown (2 of 2)]
[im 1/29]
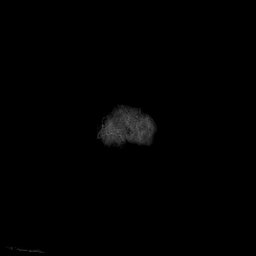
[im 29/29]
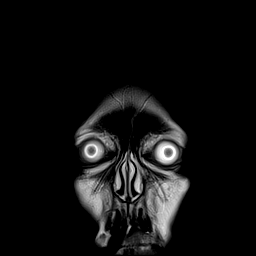

[48 of 48 positions shown; findings below may reference images not displayed]

FINDINGS: Brain: There is a punctate acute to early subacute infarct in the
left inferior frontal gyrus. There is a small acute to subacute
infarct in the right posterior temporal lobe periventricular white
matter (5-27). There is a punctate acute to subacute infarct in the
left occipital lobe (5-19). There is no associated hemorrhage or
mass effect. There is no other evidence of acute infarct. There is
no acute intracranial hemorrhage or extra-axial fluid collection

Background parenchymal volume is normal. The ventricles are normal
in size. Patchy foci of FLAIR signal abnormality in the subcortical
and periventricular white matter likely reflects sequela of mild
chronic white matter microangiopathy.

There is no mass lesion.  There is no midline shift.

Vascular: Normal flow voids.

Skull and upper cervical spine: Thickening of the right occipital
cortex is again seen, without aggressive features. There is no
suspicious marrow signal abnormality.

Sinuses/Orbits: The paranasal sinuses are clear. The globes and
orbits are unremarkable.

Other: None.
IMPRESSION: 1. Punctate acute to subacute infarcts in the left inferior frontal
gyrus, left occipital lobe, and right temporal lobe periventricular
white matter. No associated hemorrhage or mass effect.
2. Mild chronic white matter microangiopathy.

## 2022-01-21 MED ORDER — DOCUSATE SODIUM 50 MG/5ML PO LIQD
100.0000 mg | Freq: Two times a day (BID) | ORAL | Status: DC
  Administered 2022-01-21 – 2022-01-27 (×11): 100 mg
  Filled 2022-01-21 (×11): qty 10

## 2022-01-21 MED ORDER — HYDRALAZINE HCL 50 MG PO TABS
25.0000 mg | ORAL_TABLET | Freq: Three times a day (TID) | ORAL | Status: DC
  Administered 2022-01-21: 25 mg
  Filled 2022-01-21: qty 1

## 2022-01-21 MED ORDER — ACETAMINOPHEN 325 MG PO TABS
650.0000 mg | ORAL_TABLET | Freq: Four times a day (QID) | ORAL | Status: DC | PRN
  Administered 2022-01-26: 650 mg
  Filled 2022-01-21: qty 2

## 2022-01-21 MED ORDER — ACETAMINOPHEN 650 MG RE SUPP: 650.0000 mg | Freq: Four times a day (QID) | RECTAL | Status: DC | PRN

## 2022-01-21 MED ORDER — ROPINIROLE HCL 0.25 MG PO TABS
0.2500 mg | ORAL_TABLET | Freq: Every day | ORAL | Status: DC
  Administered 2022-01-21 – 2022-01-23 (×3): 0.25 mg
  Filled 2022-01-21 (×4): qty 1

## 2022-01-21 MED ORDER — ONDANSETRON HCL 4 MG/2ML IJ SOLN: 4.0000 mg | Freq: Four times a day (QID) | INTRAMUSCULAR | Status: DC | PRN

## 2022-01-21 MED ORDER — RISPERIDONE 0.5 MG PO TABS
0.5000 mg | ORAL_TABLET | Freq: Three times a day (TID) | ORAL | Status: DC
  Administered 2022-01-21 – 2022-01-24 (×9): 0.5 mg
  Filled 2022-01-21 (×12): qty 1

## 2022-01-21 MED ORDER — AMLODIPINE BESYLATE 10 MG PO TABS
10.0000 mg | ORAL_TABLET | Freq: Every day | ORAL | Status: DC
  Filled 2022-01-21: qty 1

## 2022-01-21 MED ORDER — ONDANSETRON HCL 4 MG PO TABS: 4.0000 mg | ORAL_TABLET | Freq: Four times a day (QID) | ORAL | Status: DC | PRN

## 2022-01-21 MED ORDER — POLYETHYLENE GLYCOL 3350 17 G PO PACK
17.0000 g | PACK | Freq: Every day | ORAL | Status: DC
  Administered 2022-01-22 – 2022-01-27 (×5): 17 g
  Filled 2022-01-21 (×5): qty 1

## 2022-01-21 NOTE — Progress Notes (Signed)
[facility], [place] 01/21/22  Subjective:   LOS: 4  Patient known to our practice from home hemodialysis.  She is admitted for altered mental status. Patient was seen today in ICU Patient remains intubated Patient is unable to offer any complaints    Objective:  Vital signs in last 24 hours:  Temp:  [92.3 F (33.5 C)-100.4 F (38 C)] 99.4 F (37.4 C) (01/28 0800) Pulse Rate:  [69-98] 90 (01/28 0900) Resp:  [12-23] 12 (01/28 0900) BP: (79-157)/(46-73) 117/50 (01/28 0900) SpO2:  [96 %-100 %] 100 % (01/28 0900) FiO2 (%):  [40 %] 40 % (01/28 0745) Weight:  [108.1 kg-114.4 kg] 114.4 kg (01/27 1752)  Weight change:  Filed Weights   01/20/22 0837 01/20/22 1332 01/20/22 1752  Weight: 108.1 kg 108.1 kg 114.4 kg    Intake/Output:    Intake/Output Summary (Last 24 hours) at 01/21/2022 1000 Last data filed at 01/21/2022 0900 Gross per 24 hour  Intake 1442.15 ml  Output -902 ml  Net 2344.15 ml     Physical Exam: General: Sedated  HEENT ET tube in situ  Pulm/lungs Bilateral breath sounds present, rhonchi present  CVS/Heart no rub  Abdomen:  Soft, nontender  Extremities: No peripheral edema  Neurologic: Agitated, not able to follow commands  Skin: Warm, dry  Access: Left arm AV fistula       Basic Metabolic Panel:  Recent Labs  Lab 01/02/2022 1857 01/19/22 0918 01/20/22 0417 01/20/22 1058 01/21/22 0530 01/21/22 0537  NA 134* 138 142 140 137  --   K 4.4 4.7 5.0 5.3* 3.6  --   CL 105 110 112* 112* 105  --   CO2 16* 13* 13* 16* 24  --   GLUCOSE 211* 188* 182* 233* 154*  --   BUN 49* 53* 61* 63* 33*  --   CREATININE 5.90* 6.39* 6.88* 7.37* 4.95*  --   CALCIUM 9.7 9.6 9.6 8.8* 8.5*  --   MG  --   --   --  2.6*  --  1.8  PHOS  --   --   --   --  4.5  --      CBC: Recent Labs  Lab 01/01/2022 1857 01/19/22 0918 01/20/22 0417 01/20/22 1058 01/21/22 0537  WBC 11.3* 14.0* 17.7* 17.7* 15.1*  NEUTROABS 8.9* 12.1* 15.3*  --   --    HGB 10.9* 10.2* 9.9* 9.1* 8.5*  HCT 33.7* 31.6* 31.5* 29.3* 27.7*  MCV 92.1 91.1 92.6 95.1 96.2  PLT 281 260 285 243 216      Lab Results  Component Value Date   HEPBSAG NON REACTIVE 01/20/2022   HEPBSAB Reactive (A) 01/20/2022      Microbiology:  Recent Results (from the past 240 hour(s))  Resp Panel by RT-PCR (Flu A&B, Covid) Nasopharyngeal Swab     Status: None   Collection Time: 01/23/2022  8:30 PM   Specimen: Nasopharyngeal Swab; Nasopharyngeal(NP) swabs in vial transport medium  Result Value Ref Range Status   SARS Coronavirus 2 by RT PCR NEGATIVE NEGATIVE Final    Comment: (NOTE) SARS-CoV-2 target nucleic acids are NOT DETECTED.  The SARS-CoV-2 RNA is generally detectable in upper respiratory specimens during the acute phase of infection. The lowest concentration of SARS-CoV-2 viral copies this assay can detect is 138 copies/mL. A negative result does not preclude SARS-Cov-2 infection and should not be used as the sole basis for treatment or other patient management decisions. A negative result may occur with  improper specimen collection/handling, submission of specimen other than nasopharyngeal swab, presence of viral mutation(s) within the areas targeted by this assay, and inadequate number of viral copies(<138 copies/mL). A negative result must be combined with clinical observations, patient history, and epidemiological information. The expected result is Negative.  Fact Sheet for Patients:  EntrepreneurPulse.com.au  Fact Sheet for Healthcare Providers:  IncredibleEmployment.be  This test is no t yet approved or cleared by the [place] FDA and  has been authorized for detection and/or diagnosis of SARS-CoV-2 by FDA under an Emergency Use Authorization (EUA). This EUA will remain  in effect (meaning this test can be used) for the duration of the COVID-19 declaration under Section 564(b)(1) of the Act, 21 U.S.C.section  360bbb-3(b)(1), unless the authorization is terminated  or revoked sooner.       Influenza A by PCR NEGATIVE NEGATIVE Final   Influenza B by PCR NEGATIVE NEGATIVE Final    Comment: (NOTE) The Xpert Xpress SARS-CoV-2/FLU/RSV plus assay is intended as an aid in the diagnosis of influenza from Nasopharyngeal swab specimens and should not be used as a sole basis for treatment. Nasal washings and aspirates are unacceptable for Xpert Xpress SARS-CoV-2/FLU/RSV testing.  Fact Sheet for Patients: EntrepreneurPulse.com.au  Fact Sheet for Healthcare Providers: IncredibleEmployment.be  This test is not yet approved or cleared by the [place] FDA and has been authorized for detection and/or diagnosis of SARS-CoV-2 by FDA under an Emergency Use Authorization (EUA). This EUA will remain in effect (meaning this test can be used) for the duration of the COVID-19 declaration under Section 564(b)(1) of the Act, 21 U.S.C. section 360bbb-3(b)(1), unless the authorization is terminated or revoked.  Performed at [facility], [address] [place], [place] [address]   MRSA Next Gen by PCR, Nasal     Status: None   Collection Time: 01/20/22  8:50 AM   Specimen: Nasal Mucosa; Nasal Swab  Result Value Ref Range Status   MRSA by PCR Next Gen NOT DETECTED NOT DETECTED Final    Comment: (NOTE) The GeneXpert MRSA Assay (FDA approved for NASAL specimens only), is one component of a comprehensive MRSA colonization surveillance program. It is not intended to diagnose MRSA infection nor to guide or monitor treatment for MRSA infections. Test performance is not FDA approved in patients less than 68 years old. Performed at [facility], [address] [place], [place] [address]     Coagulation Studies: No results for input(s): LABPROT, INR in the last 72 hours.  Urinalysis: No results for input(s): COLORURINE, LABSPEC, PHURINE, GLUCOSEU,  HGBUR, BILIRUBINUR, KETONESUR, PROTEINUR, UROBILINOGEN, NITRITE, LEUKOCYTESUR in the last 72 hours.  Invalid input(s): APPERANCEUR    Imaging: DG Abd 1 View  Result Date: 01/20/2022 CLINICAL DATA:  Nasogastric tube placement. EXAM: ABDOMEN - 1 VIEW COMPARISON:  Chest radiographs today and 12/26/2021. FINDINGS: 1026 hours. Nasogastric tube projects below the diaphragm, tip projected over the mid stomach. The visualized bowel gas pattern is normal. Asymmetric left basilar pulmonary opacity, likely atelectasis with a possible small pleural effusion. IMPRESSION: Nasogastric tube projects over the mid stomach. Electronically Signed   By: Richardean Sale M.D.   On: 01/20/2022 10:49   DG Chest Port 1 View  Result Date: 01/21/2022 CLINICAL DATA:  Acute respiratory failure EXAM: PORTABLE CHEST 1 VIEW COMPARISON:  Film from the previous day. FINDINGS: Endotracheal tube, gastric catheter and left jugular central line are again seen and stable. Cardiac shadow is stable. Lungs are well aerated bilaterally with mild basilar atelectasis.  No other focal abnormality is noted. IMPRESSION: Mild bibasilar atelectasis. Tubes and lines in satisfactory position. Electronically Signed   By: Inez Catalina M.D.   On: 01/21/2022 01:21   Portable Chest x-ray  Result Date: 01/20/2022 CLINICAL DATA:  Central line placement. Endotracheal tube placement. EXAM: PORTABLE CHEST 1 VIEW COMPARISON:  Radiographs 01/16/2022 FINDINGS: 1027 hours. Endotracheal tube tip overlies the mid trachea. There is a new left IJ central venous catheter which has an atypical course, projecting over the aortic arch with its tip overlying the right mainstem bronchus. A nasogastric tube projects below the diaphragm, tip not visualized on this examination. The heart size and mediastinal contours are stable allowing for mild patient rotation. No evidence of mediastinal hematoma. There are increased opacities at both lung bases, likely atelectasis. There is a  small left pleural effusion. No evidence of pneumothorax. IMPRESSION: 1. New left IJ central venous catheter follows an atypical course and cannot be confirmed to be intravenous by a single AP radiograph. Tip overlies the right mainstem bronchus. Correlate for venous blood return from the catheter. 2. Satisfactory position of the endotracheal tube. 3. Increased bibasilar atelectasis.  No pneumothorax. 4. These results will be called to the ordering clinician or representative by the Radiologist Assistant, and communication documented in the PACS or [employer]. Electronically Signed   By: Richardean Sale M.D.   On: 01/20/2022 10:53   EEG adult  Result Date: 01/21/2022 Lora Havens, MD     01/21/2022  8:17 AM Patient Name: Regina Jackson MRN: [id number] Epilepsy Attending: Lora Havens Referring Physician/Provider: Kerney Elbe, MD Date: 01/20/2022 Duration: 22.40 mins Patient history: 66 year old female with persistent AMS since admission on Tuesday.  EEG to evaluate for seizure. Level of alertness: Awake, asleep AEDs during EEG study: Propofol Technical aspects: This EEG study was done with scalp electrodes positioned according to the 10-20 International system of electrode placement. Electrical activity was acquired at a sampling rate of 500Hz  and reviewed with a high frequency filter of 70Hz  and a low frequency filter of 1Hz . EEG data were recorded continuously and digitally stored. Description: No clear posterior dominant rhythm was seen.Sleep was characterized by vertex waves, sleep spindles (12 to 14 Hz), maximal frontocentral region. EEG showed continuous generalized 5 to 7 Hz theta slowing admixed with intermittent 2-3hz  delta slowing.Hyperventilation and photic stimulation were not performed.   ABNORMALITY - Continuous slow, generalized IMPRESSION: This study is suggestive of moderate diffuse encephalopathy, nonspecific etiology. No seizures or epileptiform discharges were seen throughout  the recording. Priyanka Barbra Sarks     Medications:    sodium chloride     sodium chloride     sodium chloride Stopped (01/20/22 1400)   fentaNYL infusion INTRAVENOUS 150 mcg/hr (01/21/22 0900)   norepinephrine (LEVOPHED) Adult infusion 4 mcg/min (01/20/22 1815)   propofol (DIPRIVAN) infusion 30 mcg/kg/min (01/21/22 0900)   thiamine injection Stopped (01/20/22 1345)    albuterol  2.5 mg Nebulization Q6H   amLODipine  10 mg Per Tube Daily   chlorhexidine gluconate (MEDLINE KIT)  15 mL Mouth Rinse BID   Chlorhexidine Gluconate Cloth  6 each Topical Q0600   [START ON 01/23/2022] epoetin (EPOGEN/PROCRIT) injection  4,000 Units Intravenous Q M,W,F-HD   feeding supplement (PROSource TF)  90 mL Per Tube QID   feeding supplement (VITAL HIGH PROTEIN)  1,000 mL Per Tube Q24H   free water  30 mL Per Tube Q4H   heparin  5,000 Units Subcutaneous Q8H   hydrALAZINE  25 mg Per Tube Q8H   insulin aspart  0-15 Units Subcutaneous Q4H   levothyroxine  100 mcg Intravenous Daily   mouth rinse  15 mL Mouth Rinse 10 times per day   multivitamin  1 tablet Per Tube QHS   multivitamin  15 mL Per Tube Daily   pantoprazole (PROTONIX) IV  40 mg Intravenous Daily   risperiDONE  0.5 mg Per Tube TID   rOPINIRole  0.25 mg Per Tube QHS   sodium chloride, sodium chloride, acetaminophen **OR** acetaminophen, alteplase, heparin, labetalol, lidocaine (PF), lidocaine-prilocaine, ondansetron **OR** ondansetron (ZOFRAN) IV, pentafluoroprop-tetrafluoroeth  Assessment/ Plan:  66 y.o. female with medical problems of  End-stage renal disease, on dialysis since 2015, hypothyroidism, hyperlipidemia, diabetes, peripheral neuropathy, morbid obesity, mono, gammopathy, ischemic optic neuropathy, regarding oral thrush, COVID-19 in September 2022 history of endometrial carcinoma, obstructive sleep apnea was admitted on 01/24/2022 for  Principal Problem:   Acute metabolic encephalopathy Active Problems:   Chronic home hemodialysis  status ([place])   Hypertensive urgency   Non-compliance with renal dialysis (HCC)   Anemia of chronic kidney failure, stage 5 (HCC)   Elevated troponin   Fluid overload   Involuntary commitment   Type 2 diabetes mellitus with kidney complication, with long-term current use of insulin (HCC)   Hypothyroidism   Endometrial cancer ([place])   H/O radioactive iodine thyroid ablation  Acute metabolic encephalopathy [D22.02]  #. ESRD, with hyperkalemia Patient received renal replacement therapy yesterday after patient was stabilized on mechanical ventilation. Dialysis consent was obtained from husband at the time of admission. Patient potassium is now better No need for renal placement therapy today  #. Anemia of CKD  Lab Results  Component Value Date   HGB 8.5 (L) 01/21/2022   Low dose EPO with HD  #. Secondary hyperparathyroidism of renal origin N 25.81   No results found for: PTH Lab Results  Component Value Date   PHOS 4.5 01/21/2022   Monitor calcium and phos level during this admission   #. Diabetes type 2 with CKD Hgb A1c MFr Bld (%)  Date Value  01/18/2022 9.2 (H)  Plan as hospitalist team  #Severe hypothyroidism, myxedema coma Patient is getting IV Synthroid  #Acute respiratory failure Patient is currently intubated Patient is being closely followed by the pulmonary team    LOS: [place] [facility] 1/28/202310:00 AM  [facility], [place]

## 2022-01-21 NOTE — Progress Notes (Signed)
NAME:  Lisa-Marie Rueger, MRN:  [id number], DOB:  1956/04/08, LOS: 4 ADMISSION DATE:  12/27/2021, CONSULTATION DATE:  01/20/22 REFERRING MD:  Dr. Mal Misty, CHIEF COMPLAINT:  AMS, Acute Respiratory Distress   Brief Pt Description / Synopsis:  66 y.o. Female with PMH significant for ESRD on HD, admitted with Acute Metabolic Encephalopathy due to multiple severe metabolic derangements in setting of missed HD sessions and Hypothyroidism (query ? Myxedema coma).  Developed acute respiratory distress requiring intubation and mechanical ventilation.  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  Patient had no chest pain, shortness of breath, abdominal pain, nausea, vomiting, diarrhea.  The patient's husband reported she had not been compliant with her home hemodialysis in nearly 8 days, also unsure if she has been taking her thyroid medication.  ED Course: Initial vital signs: Temperature 97.7 orally, respiratory 15, pulse 96, blood pressure 199/163 Significant labs: Sodium 134, bicarbonate 16, glucose 211, BUN 49, creatinine 5.9, alkaline phosphatase 132, BNP 329, high-sensitivity troponin 42, WBC 11.3, hemoglobin 10.9, TSH 58.8 Venous blood gas: pH 7.3/PCO2 33/PO2 54/bicarbonate 16.2 COVID-19 PCR negative Imaging: Chest x-ray:Mild cardiomegaly with vascular congestion. Possible small left effusion. No consolidation or pneumothorax CT head:IMPRESSION: 1. No acute intracranial abnormality. 2. Mild chronic small vessel ischemia.   She was placed under IVC, and the hospitalist were asked to admit.  Nephrology was consulted for dialysis during her stay.  However given her agitation and confusion, dialysis  was unable to's be performed safely therefore he was deferred for reevaluation in the next day.  Interval History She was given IV Haldol and Valium with little improvement in her agitation.  Psychiatry was consulted.  On 01/20/2022 she remained altered, but developed acute respiratory distress requiring transfer to ICU and emergent endotracheal intubation and mechanical ventilation.   Pertinent  Medical History  ESRD on hemodialysis Noncompliance with dialysis Anemia of chronic disease Endometrial cancer Hypothyroidism Hypertension diabetes mellitus  Micro Data:  1/24: SARS-CoV-2 and influenza PCR>> negative 1/25: HIV screen>> nonreactive 1/27: MRSA PCR>> negative 1/27: Tracheal aspirate>>abundant GPC  Antimicrobials:  N/A  Significant Hospital Events: Including procedures, antibiotic start and stop dates in addition to other pertinent events   1/24: Admitted by the hospitalist for altered mental status and hypertensive urgency.  Nephrology consulted 1/25: Unable to safely perform dialysis due to severe agitation and confusion.  Plan to reassess HD tomorrow 1/26: Received IV Haldol and Valium without improvement in mental status.  Psychiatry consulted 1/27: Required emergent intubation due to AMS and Acute Respiratory Distress.  MRI and EEG pending, Neuro consulted.  Plan for HD now that pt is sedated 1/28 remains on vent,poorly responsive on SAT  Interim History / Subjective:  Poorly responsive on the ventilator Had dialysis emergently yesterday Continue supportive care, requiring low-dose pressors  Objective   Blood pressure (!) 94/49, pulse 91, temperature 99.7 F (37.6 C), temperature source Axillary, resp. rate 20, height 5' 7.52" (1.715 m), weight 114.4 kg, SpO2 95 %.    Vent Mode: PRVC FiO2 (%):  [40 %] 40 % Set Rate:  [20 bmp] 20 bmp Vt Set:  [420 mL] 420 mL PEEP:  [5 cmH20] 5 cmH20 Plateau Pressure:  [14 cmH20-17 cmH20] 17 cmH20   Intake/Output Summary (Last  24  hours) at 01/21/2022 2038 Last data filed at 01/21/2022 1800 Gross per 24 hour  Intake 1147.98 ml  Output 20 ml  Net 1127.98 ml   Filed Weights   01/20/22 0837 01/20/22 1332 01/20/22 1752  Weight: 108.1 kg 108.1 kg 114.4 kg    Examination: General: Critically ill-appearing female, laying in bed, sedated endotracheally intubated, synchronous with the vent HEENT: Atraumatic, normocephalic, neck supple, positive JVD Lungs: Coarse breath sounds bilaterally, synchronous with the vent.  Copious tan secretions from ET tube Cardiovascular: Regular rate, regular rhythm, S1-S2, grade II/VI harsh murmur LSB Abdomen: Obese, soft,non distended, bowel sounds positive  Extremities: No deformities, 1+ edema bilateral lower extremities Skin: Multiple ecchymoses throughout particularly on top of feet Neuro: Sedated, cannot assess further  Resolved Hospital Problem list     Assessment & Plan:   Acute Hypoxic Respiratory Failure in the setting of severe metabolic derangements Inability to protect airway -Full vent support, implement lung protective strategies -Plateau pressures less than 30 cm H20 -Wean FiO2 & PEEP as tolerated to maintain O2 sats >92% -Follow intermittent Chest X-ray & ABG as needed -Spontaneous Breathing Trials when respiratory parameters met and mental status permits -Implement VAP Bundle -PRN Bronchodilators -Culture tracheal aspirate, copious Page secretions  ESRD on Hemodialysis Mild Hyperkalemia Metabolic Acidosis -Monitor I&O's / urinary output -Follow BMP -Ensure adequate renal perfusion -Avoid nephrotoxic agents as able -Replace electrolytes as indicated -Nephrology following, appreciate input -HD as per Nephrology  Leukocytosis, unclear etiology ? Aspiration -Monitor fever curve -Trend WBC's & Procalcitonin -Follow cultures as above -UA not concerning for UTI -CXR with increased opacities at both lung bases, likely atelectasis -Procalcitonin 0.8,  continue to trend, with low threshold to start ABX -Copious tan sputum, cultured  Diabetes Mellitus Hypothyroidism, query ? Myxedema Coma PMHx: Radioactive Iodine Thyroid ablation -CBG's q4h; Target range of 140 to 180 -SSI -Follow ICU Hypo/Hyperglycemia protocol -Continue IV Synthroid 100 mg daily (received 200 mg x1 earlier in stay) -Follow TSH and thyroid panel  Acute Metabolic Encephalopathy in setting of multiple severe metabolic derangements (AKI and uremia, Hypothyroidism with ? Myxedema coma Sedation needs in the setting of mechanical ventilation -Maintain a RASS goal of 0 to -1 -Fentanyl and Propofol to maintain RASS goal -Avoid sedating medications as able -Daily wake up assessment -Initial CT Head negative in ED -Obtain MRI and EEG -Consulted Neurology, appreciate input -Correction of multiple metabolic derangements as above -Thiamine IV -Synthroid IV  ESRD on Hemodialysis Mild Hyperkalemia Anion Gap Metabolic Acidosis -Monitor I&O's / urinary output -Follow BMP -Ensure adequate renal perfusion -Avoid nephrotoxic agents as able -Replace electrolytes as indicated -Nephrology following, appreciate input -HD as per Nephrology  Anemia of chronic disease -Monitor for S/Sx of bleeding -Trend CBC -Heparin SQ for VTE Prophylaxis  -Transfuse for Hgb <7     Best Practice (right click and "Reselect all SmartList Selections" daily)   Diet/type: NPO DVT prophylaxis: prophylactic heparin  GI prophylaxis: PPI Lines: Central line and yes and it is still needed Foley:  Yes, and it is still needed Code Status:  full code Last date of multidisciplinary goals of care discussion [1/27]  Updated pt's brother and sister-in-law at bedside 10/27.  All questions answered.  Labs   CBC: Recent Labs  Lab 01/22/2022 1857 01/19/22 0918 01/20/22 0417 01/20/22 1058 01/21/22 0537  WBC 11.3* 14.0* 17.7* 17.7* 15.1*  NEUTROABS 8.9* 12.1* 15.3*  --   --   HGB 10.9* 10.2* 9.9*  9.1* 8.5*  HCT 33.7* 31.6* 31.5* 29.3* 27.7*  MCV 92.1 91.1 92.6 95.1 96.2  PLT 281 260 285 243 216     Basic Metabolic Panel: Recent Labs  Lab 01/12/2022 1857 01/19/22 0918 01/20/22 0417 01/20/22 1058 01/21/22 0530 01/21/22 0537  NA 134* 138 142 140 137  --   K 4.4 4.7 5.0 5.3* 3.6  --   CL 105 110 112* 112* 105  --   CO2 16* 13* 13* 16* 24  --   GLUCOSE 211* 188* 182* 233* 154*  --   BUN 49* 53* 61* 63* 33*  --   CREATININE 5.90* 6.39* 6.88* 7.37* 4.95*  --   CALCIUM 9.7 9.6 9.6 8.8* 8.5*  --   MG  --   --   --  2.6*  --  1.8  PHOS  --   --   --   --  4.5  --     GFR: Estimated Creatinine Clearance: 15.3 mL/min (A) (by C-G formula based on SCr of 4.95 mg/dL (H)). Recent Labs  Lab 01/19/22 0918 01/20/22 0417 01/20/22 1058 01/20/22 2252 01/21/22 0537  PROCALCITON  --   --   --  0.56 0.80  WBC 14.0* 17.7* 17.7*  --  15.1*     Liver Function Tests: Recent Labs  Lab 01/01/2022 1857 01/19/22 0918 01/20/22 0417 01/20/22 1058 01/21/22 0530  AST 39 50* 79* 67*  --   ALT 22 27 39 37  --   ALKPHOS 132* 119 126 114  --   BILITOT 0.8 0.8 0.8 0.7  --   PROT 7.6 7.1 7.4 6.7  --   ALBUMIN 3.5 3.3* 3.6 3.0* 2.9*    No results for input(s): LIPASE, AMYLASE in the last 168 hours. Recent Labs  Lab 01/19/22 0918  AMMONIA 16     ABG    Component Value Date/Time   PHART 7.39 01/20/2022 2108   PCO2ART 41 01/20/2022 2108   PO2ART 143 (H) 01/20/2022 2108   HCO3 24.8 01/20/2022 2108   ACIDBASEDEF 0.2 01/20/2022 2108   O2SAT 99.2 01/20/2022 2108      Coagulation Profile: No results for input(s): INR, PROTIME in the last 168 hours.  Cardiac Enzymes: No results for input(s): CKTOTAL, CKMB, CKMBINDEX, TROPONINI in the last 168 hours.  HbA1C: Hgb A1c MFr Bld  Date/Time Value Ref Range Status  01/18/2022 06:25 AM 9.2 (H) 4.8 - 5.6 % Final    Comment:    (NOTE)         Prediabetes: 5.7 - 6.4         Diabetes: >6.4         Glycemic control for adults with  diabetes: <7.0     CBG: Recent Labs  Lab 01/21/22 0350 01/21/22 0719 01/21/22 1114 01/21/22 1531 01/21/22 2020  GLUCAP 125* 135* 152* 163* 149*     Review of Systems:   Unable to assess due to AMS/conically ventilated status  Allergies Allergies  Allergen Reactions   Amoxicillin    Augmentin [Amoxicillin-Pot Clavulanate]    Ceftin [Cefuroxime]    Compazine [Prochlorperazine]    Demerol [Meperidine Hcl]    Macrobid [Nitrofurantoin]    Phenergan [Promethazine]    Sulfa Antibiotics      Medications   Scheduled Meds:  albuterol  2.5 mg Nebulization Q6H   amLODipine  10 mg Per Tube Daily   chlorhexidine gluconate (MEDLINE KIT)  15 mL Mouth Rinse BID   Chlorhexidine Gluconate Cloth  6 each Topical Q0600   [START ON 01/23/2022] epoetin (EPOGEN/PROCRIT) injection  4,000  Units Intravenous Q M,W,F-HD   feeding supplement (PROSource TF)  90 mL Per Tube QID   feeding supplement (VITAL HIGH PROTEIN)  1,000 mL Per Tube Q24H   free water  30 mL Per Tube Q4H   heparin  5,000 Units Subcutaneous Q8H   hydrALAZINE  25 mg Per Tube Q8H   insulin aspart  0-15 Units Subcutaneous Q4H   levothyroxine  100 mcg Intravenous Daily   mouth rinse  15 mL Mouth Rinse 10 times per day   multivitamin  1 tablet Per Tube QHS   multivitamin  15 mL Per Tube Daily   pantoprazole (PROTONIX) IV  40 mg Intravenous Daily   risperiDONE  0.5 mg Per Tube TID   rOPINIRole  0.25 mg Per Tube QHS   Continuous Infusions:  sodium chloride     sodium chloride     sodium chloride Stopped (01/20/22 1400)   fentaNYL infusion INTRAVENOUS 150 mcg/hr (01/21/22 1800)   norepinephrine (LEVOPHED) Adult infusion 4 mcg/min (01/20/22 1815)   propofol (DIPRIVAN) infusion 30 mcg/kg/min (01/21/22 1800)   thiamine injection Stopped (01/21/22 1152)   PRN Meds:.sodium chloride, sodium chloride, acetaminophen **OR** acetaminophen, alteplase, heparin, labetalol, lidocaine (PF), lidocaine-prilocaine, ondansetron **OR**  ondansetron (ZOFRAN) IV, pentafluoroprop-tetrafluoroeth   Critical care time: 45 minutes    The patient is critically ill with multiple organ systems failure and requires high complexity decision making for assessment and support, frequent evaluation and titration of therapies, application of advanced monitoring technologies and extensive interpretation of multiple databases. Critical Care Time devoted to patient care services described in this note is 45 minutes.   Renold Don, MD [facility]    *This note was dictated using voice recognition software/Dragon.  Despite best efforts to proofread, errors can occur which can change the meaning. Any transcriptional errors that result from this process are unintentional and may not be fully corrected at the time of dictation.

## 2022-01-21 NOTE — Clinical Note (Incomplete)
[id number] °

## 2022-01-21 NOTE — Progress Notes (Signed)
Et tube suctioned for moderate amount of tan/yellow secretions without difficulty. Sputum sample sent to lab

## 2022-01-21 NOTE — Procedures (Signed)
Patient Name: Regina Jackson  MRN: [id number]  Epilepsy Attending: Lora Havens  Referring Physician/Provider: Kerney Elbe, MD Date: 01/20/2022 Duration: 22.40 mins  Patient history: 66 year old female with persistent AMS since admission on Tuesday.  EEG to evaluate for seizure.  Level of alertness: Awake, asleep  AEDs during EEG study: Propofol  Technical aspects: This EEG study was done with scalp electrodes positioned according to the 10-20 International system of electrode placement. Electrical activity was acquired at a sampling rate of 500Hz  and reviewed with a high frequency filter of 70Hz  and a low frequency filter of 1Hz . EEG data were recorded continuously and digitally stored.   Description: No clear posterior dominant rhythm was seen.Sleep was characterized by vertex waves, sleep spindles (12 to 14 Hz), maximal frontocentral region. EEG showed continuous generalized 5 to 7 Hz theta slowing admixed with intermittent 2-3hz  delta slowing.Hyperventilation and photic stimulation were not performed.     ABNORMALITY - Continuous slow, generalized  IMPRESSION: This study is suggestive of moderate diffuse encephalopathy, nonspecific etiology. No seizures or epileptiform discharges were seen throughout the recording.  Rian Busche Barbra Sarks

## 2022-01-21 NOTE — Progress Notes (Signed)
Patient transported to MRI on transport vent with RN.

## 2022-01-22 ENCOUNTER — Encounter: Payer: Self-pay | Admitting: Internal Medicine

## 2022-01-22 ENCOUNTER — Inpatient Hospital Stay
Admit: 2022-01-22 | Discharge: 2022-01-22 | Disposition: A | Discharge status: Home / Self Care | Payer: BLUE CROSS/BLUE SHIELD | Attending: Pulmonary Disease | Admitting: Pulmonary Disease

## 2022-01-22 DIAGNOSIS — I639 Cerebral infarction, unspecified: Secondary | ICD-10-CM | POA: Diagnosis not present

## 2022-01-22 DIAGNOSIS — J9602 Acute respiratory failure with hypercapnia: Secondary | ICD-10-CM

## 2022-01-22 DIAGNOSIS — J9600 Acute respiratory failure, unspecified whether with hypoxia or hypercapnia: Secondary | ICD-10-CM

## 2022-01-22 DIAGNOSIS — G9341 Metabolic encephalopathy: Secondary | ICD-10-CM | POA: Diagnosis not present

## 2022-01-22 DIAGNOSIS — Z978 Presence of other specified devices: Secondary | ICD-10-CM

## 2022-01-22 DIAGNOSIS — Z9911 Dependence on respirator [ventilator] status: Secondary | ICD-10-CM

## 2022-01-22 LAB — GLUCOSE, CAPILLARY
Glucose-Capillary: 201 mg/dL — ABNORMAL HIGH (ref 70–99)
Glucose-Capillary: 216 mg/dL — ABNORMAL HIGH (ref 70–99)
Glucose-Capillary: 218 mg/dL — ABNORMAL HIGH (ref 70–99)
Glucose-Capillary: 221 mg/dL — ABNORMAL HIGH (ref 70–99)

## 2022-01-22 LAB — ECHOCARDIOGRAM COMPLETE
AR max vel: 1.64 cm2
AV Area VTI: 1.95 cm2
AV Area mean vel: 1.63 cm2
AV Mean grad: 11 mmHg
AV Peak grad: 18.6 mmHg
Ao pk vel: 2.16 m/s
Area-P 1/2: 4.68 cm2
Calc EF: 82.4 %
Height: 67.52 in
MV VTI: 2.08 cm2
S' Lateral: 3 cm
Single Plane A2C EF: 81.2 %
Single Plane A4C EF: 79.8 %
Weight: 4035.3 oz

## 2022-01-22 LAB — VITAMIN B1: Vitamin B1 (Thiamine): 123.3 nmol/L (ref 66.5–200.0)

## 2022-01-22 LAB — RENAL FUNCTION PANEL
Albumin: 2.6 g/dL — ABNORMAL LOW (ref 3.5–5.0)
Anion gap: 11 (ref 5–15)
BUN: 58 mg/dL — ABNORMAL HIGH (ref 8–23)
CO2: 20 mmol/L — ABNORMAL LOW (ref 22–32)
Calcium: 8.8 mg/dL — ABNORMAL LOW (ref 8.9–10.3)
Chloride: 101 mmol/L (ref 98–111)
Creatinine, Ser: 6.22 mg/dL — ABNORMAL HIGH (ref 0.44–1.00)
GFR, Estimated: 7 mL/min — ABNORMAL LOW (ref >60)
Glucose, Bld: 230 mg/dL — ABNORMAL HIGH (ref 70–99)
Phosphorus: 6.3 mg/dL — ABNORMAL HIGH (ref 2.5–4.6)
Potassium: 3.6 mmol/L (ref 3.5–5.1)
Sodium: 132 mmol/L — ABNORMAL LOW (ref 135–145)

## 2022-01-22 LAB — CBC
HCT: 27.1 % — ABNORMAL LOW (ref 36.0–46.0)
Hemoglobin: 8.3 g/dL — ABNORMAL LOW (ref 12.0–15.0)
MCH: 29.5 pg (ref 26.0–34.0)
MCHC: 30.6 g/dL (ref 30.0–36.0)
MCV: 96.4 fL (ref 80.0–100.0)
Platelets: 170 10*3/uL (ref 150–400)
RBC: 2.81 MIL/uL — ABNORMAL LOW (ref 3.87–5.11)
RDW: 14.2 % (ref 11.5–15.5)
WBC: 17.8 10*3/uL — ABNORMAL HIGH (ref 4.0–10.5)
nRBC: 0 % (ref 0.0–0.2)

## 2022-01-22 LAB — THYROID PANEL WITH TSH
Free Thyroxine Index: 1.4 (ref 1.2–4.9)
T3 Uptake Ratio: 29 % (ref 24–39)
T4, Total: 4.7 ug/dL (ref 4.5–12.0)
TSH: 37.7 u[IU]/mL — ABNORMAL HIGH (ref 0.450–4.500)

## 2022-01-22 LAB — PROCALCITONIN: Procalcitonin: 1.86 ng/mL

## 2022-01-22 LAB — MAGNESIUM: Magnesium: 1.8 mg/dL (ref 1.7–2.4)

## 2022-01-22 MED ORDER — VANCOMYCIN HCL 2000 MG/400ML IV SOLN
2000.0000 mg | Freq: Once | INTRAVENOUS | Status: AC
  Administered 2022-01-22: 2000 mg via INTRAVENOUS
  Filled 2022-01-22: qty 400

## 2022-01-22 MED ORDER — VANCOMYCIN HCL IN DEXTROSE 1-5 GM/200ML-% IV SOLN
1000.0000 mg | INTRAVENOUS | Status: DC | PRN
  Administered 2022-01-23: 1000 mg via INTRAVENOUS
  Filled 2022-01-22 (×3): qty 200

## 2022-01-22 MED ORDER — FENTANYL CITRATE PF 50 MCG/ML IJ SOSY
50.0000 ug | PREFILLED_SYRINGE | INTRAMUSCULAR | Status: DC | PRN
  Filled 2022-01-22: qty 1

## 2022-01-22 MED ORDER — FENTANYL CITRATE PF 50 MCG/ML IJ SOSY
50.0000 ug | PREFILLED_SYRINGE | INTRAMUSCULAR | Status: DC | PRN
  Administered 2022-01-23: 50 ug via INTRAVENOUS
  Filled 2022-01-22 (×2): qty 2

## 2022-01-22 MED ORDER — ASPIRIN EC 81 MG PO TBEC: 81.0000 mg | DELAYED_RELEASE_TABLET | Freq: Every day | ORAL | Status: DC

## 2022-01-22 MED ORDER — PERFLUTREN LIPID MICROSPHERE
1.0000 mL | INTRAVENOUS | Status: AC | PRN
  Administered 2022-01-22: 3 mL via INTRAVENOUS
  Filled 2022-01-22: qty 10

## 2022-01-22 MED ORDER — ASPIRIN 81 MG PO CHEW: 81.0000 mg | CHEWABLE_TABLET | Freq: Every day | ORAL | Status: DC

## 2022-01-22 MED ORDER — ASPIRIN 81 MG PO CHEW
81.0000 mg | CHEWABLE_TABLET | Freq: Every day | ORAL | Status: DC
  Administered 2022-01-22 – 2022-01-27 (×6): 81 mg
  Filled 2022-01-22 (×6): qty 1

## 2022-01-22 NOTE — Consult Note (Signed)
Pharmacy Antibiotic Note  Regina Jackson is a 66 y.o. female with medical history including ESRD on HD, hypothyroidism, anemia of chronic disease, endometrial cancer, HTN, diabetes admitted on 01/12/2022 with  acute metabolic encephalopathy in setting of severe metabolic derangements s/t missed hemodialysis, hypothyroidism . Patient ultimately developed acute respiratory distress requiring intubation and mechanical ventilation.  Pharmacy has been consulted for vancomycin dosing.  Plan:  Vancomycin 2 g IV LD followed by vancomycin 1 g IV qHD --Continue to follow HD schedule per nephrology --Levels as clinically indicated  Height: 5' 7.52" (171.5 cm) Weight: 114.4 kg (252 lb 3.3 oz) IBW/kg (Calculated) : 62.8  Temp (24hrs), Avg:99.4 F (37.4 C), Min:98.6 F (37 C), Max:100.3 F (37.9 C)  Recent Labs  Lab 01/19/22 0918 01/20/22 0417 01/20/22 1058 01/21/22 0530 01/21/22 0537 01/22/22 0340  WBC 14.0* 17.7* 17.7*  --  15.1* 17.8*  CREATININE 6.39* 6.88* 7.37* 4.95*  --  6.22*    Estimated Creatinine Clearance: 12.2 mL/min (A) (by C-G formula based on SCr of 6.22 mg/dL (H)).    Allergies  Allergen Reactions   Amoxicillin    Augmentin [Amoxicillin-Pot Clavulanate]    Ceftin [Cefuroxime]    Compazine [Prochlorperazine]    Demerol [Meperidine Hcl]    Macrobid [Nitrofurantoin]    Phenergan [Promethazine]    Sulfa Antibiotics     Antimicrobials this admission: Vancomycin 1/29 >>   Dose adjustments this admission: N/A  Microbiology results: 1/27 MRSA PCR: (-) 1/28 Sputum: Staphylococcus aureus, pending   Thank you for allowing pharmacy to be a part of this patients care.  Benita Gutter 01/22/2022 1:11 PM

## 2022-01-22 NOTE — Progress Notes (Signed)
[facility], [place] 01/22/22  Subjective:   LOS: 5  Patient known to our practice from home hemodialysis.  She is admitted for altered mental status. Patient was seen today in ICU Patient remains intubated Patient is unable to offer any complaints    Objective:  Vital signs in last 24 hours:  Temp:  [98.6 F (37 C)-100.3 F (37.9 C)] 100.1 F (37.8 C) (01/29 0700) Pulse Rate:  [84-101] 99 (01/29 0700) Resp:  [10-23] 17 (01/29 0700) BP: (94-146)/(44-73) 122/61 (01/29 0700) SpO2:  [95 %-100 %] 96 % (01/29 0700) FiO2 (%):  [40 %] 40 % (01/29 0700)  Weight change:  Filed Weights   01/20/22 0837 01/20/22 1332 01/20/22 1752  Weight: 108.1 kg 108.1 kg 114.4 kg    Intake/Output:    Intake/Output Summary (Last 24 hours) at 01/22/2022 0854 Last data filed at 01/22/2022 0539 Gross per 24 hour  Intake 1401.47 ml  Output 35 ml  Net 1366.47 ml     Physical Exam: General: Sedated  HEENT ET tube in situ  Pulm/lungs Bilateral breath sounds present, rhonchi present  CVS/Heart no rub  Abdomen:  Soft, nontender  Extremities: No peripheral edema  Neurologic: Agitated, not able to follow commands  Skin: Warm, dry  Access: Left arm AV fistula       Basic Metabolic Panel:  Recent Labs  Lab 01/19/22 0918 01/20/22 0417 01/20/22 1058 01/21/22 0530 01/21/22 0537 01/22/22 0340  NA 138 142 140 137  --  132*  K 4.7 5.0 5.3* 3.6  --  3.6  CL 110 112* 112* 105  --  101  CO2 13* 13* 16* 24  --  20*  GLUCOSE 188* 182* 233* 154*  --  230*  BUN 53* 61* 63* 33*  --  58*  CREATININE 6.39* 6.88* 7.37* 4.95*  --  6.22*  CALCIUM 9.6 9.6 8.8* 8.5*  --  8.8*  MG  --   --  2.6*  --  1.8 1.8  PHOS  --   --   --  4.5  --  6.3*     CBC: Recent Labs  Lab 01/09/2022 1857 01/19/22 0918 01/20/22 0417 01/20/22 1058 01/21/22 0537 01/22/22 0340  WBC 11.3* 14.0* 17.7* 17.7* 15.1* 17.8*  NEUTROABS 8.9* 12.1* 15.3*  --   --   --   HGB 10.9* 10.2* 9.9* 9.1*  8.5* 8.3*  HCT 33.7* 31.6* 31.5* 29.3* 27.7* 27.1*  MCV 92.1 91.1 92.6 95.1 96.2 96.4  PLT 281 260 285 243 216 170      Lab Results  Component Value Date   HEPBSAG NON REACTIVE 01/20/2022   HEPBSAB Reactive (A) 01/20/2022      Microbiology:  Recent Results (from the past 240 hour(s))  Resp Panel by RT-PCR (Flu A&B, Covid) Nasopharyngeal Swab     Status: None   Collection Time: 12/25/2021  8:30 PM   Specimen: Nasopharyngeal Swab; Nasopharyngeal(NP) swabs in vial transport medium  Result Value Ref Range Status   SARS Coronavirus 2 by RT PCR NEGATIVE NEGATIVE Final    Comment: (NOTE) SARS-CoV-2 target nucleic acids are NOT DETECTED.  The SARS-CoV-2 RNA is generally detectable in upper respiratory specimens during the acute phase of infection. The lowest concentration of SARS-CoV-2 viral copies this assay can detect is 138 copies/mL. A negative result does not preclude SARS-Cov-2 infection and should not be used as the sole basis for treatment or other patient management decisions. A negative result may occur with  improper specimen collection/handling,  submission of specimen other than nasopharyngeal swab, presence of viral mutation(s) within the areas targeted by this assay, and inadequate number of viral copies(<138 copies/mL). A negative result must be combined with clinical observations, patient history, and epidemiological information. The expected result is Negative.  Fact Sheet for Patients:  EntrepreneurPulse.com.au  Fact Sheet for Healthcare Providers:  IncredibleEmployment.be  This test is no t yet approved or cleared by the [place] FDA and  has been authorized for detection and/or diagnosis of SARS-CoV-2 by FDA under an Emergency Use Authorization (EUA). This EUA will remain  in effect (meaning this test can be used) for the duration of the COVID-19 declaration under Section 564(b)(1) of the Act, 21 U.S.C.section  360bbb-3(b)(1), unless the authorization is terminated  or revoked sooner.       Influenza A by PCR NEGATIVE NEGATIVE Final   Influenza B by PCR NEGATIVE NEGATIVE Final    Comment: (NOTE) The Xpert Xpress SARS-CoV-2/FLU/RSV plus assay is intended as an aid in the diagnosis of influenza from Nasopharyngeal swab specimens and should not be used as a sole basis for treatment. Nasal washings and aspirates are unacceptable for Xpert Xpress SARS-CoV-2/FLU/RSV testing.  Fact Sheet for Patients: EntrepreneurPulse.com.au  Fact Sheet for Healthcare Providers: IncredibleEmployment.be  This test is not yet approved or cleared by the [place] FDA and has been authorized for detection and/or diagnosis of SARS-CoV-2 by FDA under an Emergency Use Authorization (EUA). This EUA will remain in effect (meaning this test can be used) for the duration of the COVID-19 declaration under Section 564(b)(1) of the Act, 21 U.S.C. section 360bbb-3(b)(1), unless the authorization is terminated or revoked.  Performed at [facility], [address] [place], [place] [address]   MRSA Next Gen by PCR, Nasal     Status: None   Collection Time: 01/20/22  8:50 AM   Specimen: Nasal Mucosa; Nasal Swab  Result Value Ref Range Status   MRSA by PCR Next Gen NOT DETECTED NOT DETECTED Final    Comment: (NOTE) The GeneXpert MRSA Assay (FDA approved for NASAL specimens only), is one component of a comprehensive MRSA colonization surveillance program. It is not intended to diagnose MRSA infection nor to guide or monitor treatment for MRSA infections. Test performance is not FDA approved in patients less than 75 years old. Performed at [facility], [address] [place], [place] [address]   Culture, Respiratory w Gram Stain     Status: None (Preliminary result)   Collection Time: 01/21/22  2:46 AM   Specimen: Tracheal Aspirate; Respiratory  Result Value  Ref Range Status   Specimen Description   Final    TRACHEAL ASPIRATE Performed at [facility], [address], [place], [place] [address]    Special Requests   Final    NONE Performed at [facility], [address] [place], [place] [address]    Gram Stain   Final    ABUNDANT WBC PRESENT, PREDOMINANTLY PMN ABUNDANT GRAM POSITIVE COCCI IN CLUSTERS Performed at [facility], [place] [address], [place],  [address]    Culture PENDING  Incomplete   Report Status PENDING  Incomplete    Coagulation Studies: No results for input(s): LABPROT, INR in the last 72 hours.  Urinalysis: No results for input(s): COLORURINE, LABSPEC, PHURINE, GLUCOSEU, HGBUR, BILIRUBINUR, KETONESUR, PROTEINUR, UROBILINOGEN, NITRITE, LEUKOCYTESUR in the last 72 hours.  Invalid input(s): APPERANCEUR    Imaging: DG Abd 1 View  Result Date: 01/20/2022 CLINICAL DATA:  Nasogastric  tube placement. EXAM: ABDOMEN - 1 VIEW COMPARISON:  Chest radiographs today and 01/20/2022. FINDINGS: 1026 hours. Nasogastric tube projects below the diaphragm, tip projected over the mid stomach. The visualized bowel gas pattern is normal. Asymmetric left basilar pulmonary opacity, likely atelectasis with a possible small pleural effusion. IMPRESSION: Nasogastric tube projects over the mid stomach. Electronically Signed   By: Richardean Sale M.D.   On: 01/20/2022 10:49   MR BRAIN WO CONTRAST  Result Date: 01/21/2022 CLINICAL DATA:  Altered mental status EXAM: MRI HEAD WITHOUT CONTRAST TECHNIQUE: Multiplanar, multiecho pulse sequences of the brain and surrounding structures were obtained without intravenous contrast. COMPARISON:  CT head 01/09/2022 FINDINGS: Brain: There is a punctate acute to early subacute infarct in the left inferior frontal gyrus. There is a small acute to subacute infarct in the right posterior temporal lobe periventricular white matter (5-27). There is a punctate acute to subacute infarct in  the left occipital lobe (5-19). There is no associated hemorrhage or mass effect. There is no other evidence of acute infarct. There is no acute intracranial hemorrhage or extra-axial fluid collection Background parenchymal volume is normal. The ventricles are normal in size. Patchy foci of FLAIR signal abnormality in the subcortical and periventricular white matter likely reflects sequela of mild chronic white matter microangiopathy. There is no mass lesion.  There is no midline shift. Vascular: Normal flow voids. Skull and upper cervical spine: Thickening of the right occipital cortex is again seen, without aggressive features. There is no suspicious marrow signal abnormality. Sinuses/Orbits: The paranasal sinuses are clear. The globes and orbits are unremarkable. Other: None. IMPRESSION: 1. 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 No associated hemorrhage or mass effect. 2. Mild chronic white matter microangiopathy. Electronically Signed   By: Valetta Mole M.D.   On: 01/21/2022 15:17   DG Chest Port 1 View  Result Date: 01/21/2022 CLINICAL DATA:  Acute respiratory failure EXAM: PORTABLE CHEST 1 VIEW COMPARISON:  Film from the previous day. FINDINGS: Endotracheal tube, gastric catheter and left jugular central line are again seen and stable. Cardiac shadow is stable. Lungs are well aerated bilaterally with mild basilar atelectasis. No other focal abnormality is noted. IMPRESSION: Mild bibasilar atelectasis. Tubes and lines in satisfactory position. Electronically Signed   By: Inez Catalina M.D.   On: 01/21/2022 01:21   Portable Chest x-ray  Result Date: 01/20/2022 CLINICAL DATA:  Central line placement. Endotracheal tube placement. EXAM: PORTABLE CHEST 1 VIEW COMPARISON:  Radiographs 12/26/2021 FINDINGS: 1027 hours. Endotracheal tube tip overlies the mid trachea. There is a new left IJ central venous  catheter which has an atypical course, projecting over the aortic arch with its tip overlying the right mainstem bronchus. A nasogastric tube projects below the diaphragm, tip not visualized on this examination. The heart size and mediastinal contours are stable allowing for mild patient rotation. No evidence of mediastinal hematoma. There are increased opacities at both lung bases, likely atelectasis. There is a small left pleural effusion. No evidence of pneumothorax. IMPRESSION: 1. New left IJ central venous catheter follows an atypical course and cannot be confirmed to be intravenous by a single AP radiograph. Tip overlies the right mainstem bronchus. Correlate for venous blood return from the catheter. 2. Satisfactory position of the endotracheal tube. 3. Increased bibasilar atelectasis.  No pneumothorax. 4. These results will be called to the ordering clinician or representative by the Radiologist Assistant, and communication documented in  the PACS or [employer]. Electronically Signed   By: Richardean Sale M.D.   On: 01/20/2022 10:53   EEG adult  Result Date: 01/21/2022 Lora Havens, MD     01/21/2022  8:17 AM Patient Name: Regina Jackson MRN: [id number] Epilepsy Attending: Lora Havens Referring Physician/Provider: Kerney Elbe, MD Date: 01/20/2022 Duration: 22.40 mins Patient history: 66 year old female with persistent AMS since admission on Tuesday.  EEG to evaluate for seizure. Level of alertness: Awake, asleep AEDs during EEG study: Propofol Technical aspects: This EEG study was done with scalp electrodes positioned according to the 10-20 International system of electrode placement. Electrical activity was acquired at a sampling rate of 500Hz  and reviewed with a high frequency filter of 70Hz  and a low frequency filter of 1Hz . EEG data were recorded continuously and digitally stored. Description: No clear posterior dominant rhythm was seen.Sleep was characterized by vertex waves, sleep  spindles (12 to 14 Hz), maximal frontocentral region. EEG showed continuous generalized 5 to 7 Hz theta slowing admixed with intermittent 2-3hz  delta slowing.Hyperventilation and photic stimulation were not performed.   ABNORMALITY - Continuous slow, generalized IMPRESSION: This study is suggestive of moderate diffuse encephalopathy, nonspecific etiology. No seizures or epileptiform discharges were seen throughout the recording. Priyanka Barbra Sarks     Medications:    sodium chloride     sodium chloride     sodium chloride Stopped (01/20/22 1400)   fentaNYL infusion INTRAVENOUS Stopped (01/22/22 0600)   norepinephrine (LEVOPHED) Adult infusion 2 mcg/min (01/22/22 7282)   propofol (DIPRIVAN) infusion 10 mcg/kg/min (01/22/22 0600)   thiamine injection Stopped (01/21/22 1152)    albuterol  2.5 mg Nebulization Q6H   amLODipine  10 mg Per Tube Daily   chlorhexidine gluconate (MEDLINE KIT)  15 mL Mouth Rinse BID   Chlorhexidine Gluconate Cloth  6 each Topical Q0600   docusate  100 mg Per Tube BID   [START ON 01/23/2022] epoetin (EPOGEN/PROCRIT) injection  4,000 Units Intravenous Q M,W,F-HD   feeding supplement (PROSource TF)  90 mL Per Tube QID   feeding supplement (VITAL HIGH PROTEIN)  1,000 mL Per Tube Q24H   free water  30 mL Per Tube Q4H   heparin  5,000 Units Subcutaneous Q8H   hydrALAZINE  25 mg Per Tube Q8H   insulin aspart  0-15 Units Subcutaneous Q4H   levothyroxine  100 mcg Intravenous Daily   mouth rinse  15 mL Mouth Rinse 10 times per day   multivitamin  1 tablet Per Tube QHS   multivitamin  15 mL Per Tube Daily   pantoprazole (PROTONIX) IV  40 mg Intravenous Daily   polyethylene glycol  17 g Per Tube Daily   risperiDONE  0.5 mg Per Tube TID   rOPINIRole  0.25 mg Per Tube QHS   sodium chloride, sodium chloride, acetaminophen **OR** acetaminophen, alteplase, heparin, labetalol, lidocaine (PF), lidocaine-prilocaine, ondansetron **OR** ondansetron (ZOFRAN) IV,  pentafluoroprop-tetrafluoroeth  Assessment/ Plan:  66 y.o. female with medical problems of  End-stage renal disease, on dialysis since 2015, hypothyroidism, hyperlipidemia, diabetes, peripheral neuropathy, morbid obesity, mono, gammopathy, ischemic optic neuropathy, regarding oral thrush, COVID-19 in September 2022 history of endometrial carcinoma, obstructive sleep apnea was admitted on 01/05/2022 for  Principal Problem:   Acute metabolic encephalopathy Active Problems:   Chronic home hemodialysis status ([place])   Hypertensive urgency   Non-compliance with renal dialysis ([place])   Anemia of chronic kidney failure, stage 5 (HCC)   Elevated troponin   Fluid overload  Involuntary commitment   Type 2 diabetes mellitus with kidney complication, with long-term current use of insulin (HCC)   Hypothyroidism   Endometrial cancer ([place])   H/O radioactive iodine thyroid ablation   Acute respiratory failure ([place])   Endotracheally intubated   On mechanically assisted ventilation ([place])  Acute metabolic encephalopathy [Q00.37]  #. ESRD, with hyperkalemia Patient received renal replacement therapy On Friday after patient was stabilized on mechanical ventilation. Dialysis consent was obtained from husband at the time of admission. Patient potassium is now better No need for renal placement therapy today We will dialyze patient tomorrow  #. Anemia of CKD  Lab Results  Component Value Date   HGB 8.3 (L) 01/22/2022   Low dose EPO with HD  #. Secondary hyperparathyroidism of renal origin N 25.81      Component Value Date/Time   PTH 492 (H) 01/20/2022 1304   Lab Results  Component Value Date   PHOS 6.3 (H) 01/22/2022   Monitor calcium and phos level during this admission   #. Diabetes type 2 with CKD Hgb A1c MFr Bld (%)  Date Value  01/18/2022 9.2 (H)  Plan as hospitalist team  #Severe hypothyroidism, myxedema coma Patient is getting IV Synthroid  #Acute respiratory  failure Patient is currently intubated Patient is being closely followed by the pulmonary team    LOS: [facility] 1/29/20238:54 AM  Tony, [place]

## 2022-01-22 NOTE — Progress Notes (Signed)
Remains in NSR ,Intubated and sedated. Family in to see her this PM. Updated per Dr. Duwayne Heck on patient status and MRI results. Failed SBT this am. Urine output remains scant. No dialysis per nephrology today.

## 2022-01-22 NOTE — Progress Notes (Signed)
Subjective: Did grimace when turned by RN earlier today, while on propofol at a rate of 10 and after fentanyl was held. Now back on fentanyl at a rate of 75. Propofol continues at a rate of 10.   Objective: Current vital signs: BP 122/61    Pulse 99    Temp 100.1 F (37.8 C)    Resp 17    Ht 5' 7.52" (1.715 m)    Wt 114.4 kg    SpO2 96%    BMI 38.89 kg/m  Vital signs in last 24 hours: Temp:  [98.6 F (37 C)-100.3 F (37.9 C)] 100.1 F (37.8 C) (01/29 0700) Pulse Rate:  [84-101] 99 (01/29 0700) Resp:  [10-23] 17 (01/29 0700) BP: (94-146)/(44-73) 122/61 (01/29 0700) SpO2:  [95 %-100 %] 96 % (01/29 0700) FiO2 (%):  [40 %] 40 % (01/29 0804)  Intake/Output from previous day: 01/28 0701 - 01/29 0700 In: 1436 [I.V.:766; NG/GT:620; IV Piggyback:50] Out: 35 [Urine:35] Intake/Output this shift: No intake/output data recorded. Nutritional status:  Diet Order             Diet NPO time specified  Diet effective now                    Physical Exam  HEENT-  [place]/AT. Neck is supple.   Lungs- Intubated and sedated    Extremities- Warm and well perfused    Neurological Examination Note: On fentanyl at a rate of 75 and propofol at a rate of 10.  Mental Status: Intubated and sedated on propofol and fentanyl. Does not respond to loud clapping or calling of her name in the sedated state. No purposeful movements. No posturing to noxious except for internal rotation of LLE and thigh and ankle to noxious and slight withdrawal of LUE by about 2 cm in response to noxious applied to either arm. No eye opening in the context of sedation.  Cranial Nerves: II: Pupils almost pinpoint and are unreactive (on sedation). No blink to threat.  III,IV, VI: Eyes exotropic near the midline without doll's eye reflex on sedation. V,VII: Weak corneals bilaterally   VIII: No response to voice IX,X: Intubated. Cough and gag intact.  XI: Head is midline.  XII: Intubated Motor/Sensory: Flaccid tone x 4 at rest.   No purposeful movements. No posturing to noxious except for internal rotation of LLE and thigh and ankle to noxious and slight withdrawal of LUE by about 2 cm in response to noxious applied to either arm.  Deep Tendon Reflexes: Hypoactive in the context of sedation. Toes mute on the right and upgoing on the left  Cerebellar/Gait: Unable to assess  Lab Results: Results for orders placed or performed during the hospital encounter of 01/20/2022 (from the past 48 hour(s))  Blood gas, arterial     Status: Abnormal   Collection Time: 01/20/22 10:15 AM  Result Value Ref Range   FIO2 0.40    Mode PRESSURE REGULATED VOLUME CONTROL    VT 420 mL   LHR 16 resp/min   Peep/cpap 5.0 cm H20   pH, Arterial 7.12 (LL) 7.350 - 7.450    Comment: NOTIFIED PHYSICIAN GONZALEZ MD ON [id number] _0  HH    pCO2 arterial 44 32.0 - 48.0 mmHg   pO2, Arterial 110 (H) 83.0 - 108.0 mmHg   Bicarbonate 14.3 (L) 20.0 - 28.0 mmol/L   Acid-base deficit 14.3 (H) 0.0 - 2.0 mmol/L   O2 Saturation 96.3 %   Patient temperature 37.0    Collection  site RIGHT RADIAL    Sample type ARTERIAL DRAW    Allens test (pass/fail) PASS PASS   Mechanical Rate 16     Comment: Performed at [facility], [address] [place], [place] [address]  CBC     Status: Abnormal   Collection Time: 01/20/22 10:58 AM  Result Value Ref Range   WBC 17.7 (H) 4.0 - 10.5 K/uL   RBC 3.08 (L) 3.87 - 5.11 MIL/uL   Hemoglobin 9.1 (L) 12.0 - 15.0 g/dL   HCT 29.3 (L) 36.0 - 46.0 %   MCV 95.1 80.0 - 100.0 fL   MCH 29.5 26.0 - 34.0 pg   MCHC 31.1 30.0 - 36.0 g/dL   RDW 14.1 11.5 - 15.5 %   Platelets 243 150 - 400 K/uL   nRBC 0.0 0.0 - 0.2 %    Comment: Performed at [facility], [address] [place], [place] [address]  Comprehensive metabolic panel     Status: Abnormal   Collection Time: 01/20/22 10:58 AM  Result Value Ref Range   Sodium 140 135 - 145 mmol/L   Potassium 5.3 (H) 3.5 - 5.1 mmol/L   Chloride 112 (H) 98 - 111 mmol/L    CO2 16 (L) 22 - 32 mmol/L   Glucose, Bld 233 (H) 70 - 99 mg/dL    Comment: Glucose reference range applies only to samples taken after fasting for at least 8 hours.   BUN 63 (H) 8 - 23 mg/dL   Creatinine, Ser 7.37 (H) 0.44 - 1.00 mg/dL   Calcium 8.8 (L) 8.9 - 10.3 mg/dL   Total Protein 6.7 6.5 - 8.1 g/dL   Albumin 3.0 (L) 3.5 - 5.0 g/dL   AST 67 (H) 15 - 41 U/L   ALT 37 0 - 44 U/L   Alkaline Phosphatase 114 38 - 126 U/L   Total Bilirubin 0.7 0.3 - 1.2 mg/dL   GFR, Estimated 6 (L) >60 mL/min    Comment: (NOTE) Calculated using the CKD-EPI Creatinine Equation (2021)    Anion gap 12 5 - 15    Comment: Performed at [facility], [address] [place], [place] [address]  Magnesium     Status: Abnormal   Collection Time: 01/20/22 10:58 AM  Result Value Ref Range   Magnesium 2.6 (H) 1.7 - 2.4 mg/dL    Comment: Performed at [facility], [address] [place], [place] [address]  Glucose, capillary     Status: Abnormal   Collection Time: 01/20/22 11:09 AM  Result Value Ref Range   Glucose-Capillary 210 (H) 70 - 99 mg/dL    Comment: Glucose reference range applies only to samples taken after fasting for at least 8 hours.  Glucose, capillary     Status: Abnormal   Collection Time: 01/20/22 12:29 PM  Result Value Ref Range   Glucose-Capillary 214 (H) 70 - 99 mg/dL    Comment: Glucose reference range applies only to samples taken after fasting for at least 8 hours.  Parathyroid hormone, intact (no Ca)     Status: Abnormal   Collection Time: 01/20/22  1:04 PM  Result Value Ref Range   PTH 492 (H) 15 - 65 pg/mL    Comment: (NOTE) Performed At: [facility] [place], [place] [id number] Rush Farmer MD IW:[id number]   Hepatitis B surface antigen     Status: None   Collection Time: 01/20/22  1:04 PM  Result Value Ref Range   Hepatitis B Surface  Ag NON REACTIVE NON REACTIVE  °  Comment: Performed at [facility], [address]  [place], [place] [address]  °Hepatitis B surface antibody     Status: Abnormal  ° Collection Time: 01/20/22  1:04 PM  °Result Value Ref Range  ° Hep B S Ab Reactive (A) NON REACTIVE  °  Comment: (NOTE) °Consistent with immunity, greater than 9.9 mIU/mL. ° °Performed at [facility], [address] [place], [place] °[address] °  °Glucose, capillary     Status: Abnormal  ° Collection Time: 01/20/22  3:03 PM  °Result Value Ref Range  ° Glucose-Capillary 187 (H) 70 - 99 mg/dL  °  Comment: Glucose reference range applies only to samples taken after fasting for at least 8 hours.  °Glucose, capillary     Status: Abnormal  ° Collection Time: 01/20/22  7:44 PM  °Result Value Ref Range  ° Glucose-Capillary 129 (H) 70 - 99 mg/dL  °  Comment: Glucose reference range applies only to samples taken after fasting for at least 8 hours.  °Blood gas, arterial     Status: Abnormal  ° Collection Time: 01/20/22  9:08 PM  °Result Value Ref Range  ° FIO2 40.00   ° Mode PRESSURE REGULATED VOLUME CONTROL   ° VT 420 mL  ° Peep/cpap 5.0 cm H20  ° pH, Arterial 7.39 7.350 - 7.450  ° pCO2 arterial 41 32.0 - 48.0 mmHg  ° pO2, Arterial 143 (H) 83.0 - 108.0 mmHg  ° Bicarbonate 24.8 20.0 - 28.0 mmol/L  ° Acid-base deficit 0.2 0.0 - 2.0 mmol/L  ° O2 Saturation 99.2 %  ° Patient temperature 37.0   ° Collection site RIGHT RADIAL   ° Sample type ARTERIAL DRAW   ° Allens test (pass/fail) PASS PASS  ° Mechanical Rate 20   °  Comment: Performed at [facility], [address] [place], [place] [address]  °Procalcitonin - Baseline     Status: None  ° Collection Time: 01/20/22 10:52 PM  °Result Value Ref Range  ° Procalcitonin 0.56 ng/mL  °  Comment:        °Interpretation: °PCT > 0.5 ng/mL and <= 2 ng/mL: °Systemic infection (sepsis) is possible, °but other conditions are known to elevate °PCT as well. °(NOTE) °      Sepsis PCT Algorithm           Lower Respiratory Tract °                                     Infection PCT Algorithm °    ----------------------------     ---------------------------- °        PCT < 0.25 ng/mL                PCT < 0.10 ng/mL ° °        Strongly encourage             Strongly discourage °  discontinuation of antibiotics    initiation of antibiotics °   ----------------------------     ----------------------------- °      PCT 0.25 - 0.50 ng/mL            PCT 0.10 - 0.25 ng/mL °              OR °      >80% decrease in PCT              Discourage initiation of                                            antibiotics      Encourage discontinuation           of antibiotics    ----------------------------     -----------------------------         PCT >= 0.50 ng/mL              PCT 0.26 - 0.50 ng/mL                AND       <80% decrease in PCT             Encourage initiation of                                             antibiotics       Encourage continuation           of antibiotics    ----------------------------     -----------------------------        PCT >= 0.50 ng/mL                  PCT > 0.50 ng/mL               AND         increase in PCT                  Strongly encourage                                      initiation of antibiotics    Strongly encourage escalation           of antibiotics                                     -----------------------------                                           PCT <= 0.25 ng/mL                                                 OR                                        > 80% decrease in PCT                                      Discontinue / Do not initiate  antibiotics ° °Performed at [facility], [address] [place], °[place] [address] °  °Glucose, capillary     Status: Abnormal  ° Collection Time: 01/20/22 11:36 PM  °Result Value Ref Range  ° Glucose-Capillary 113 (H) 70 - 99 mg/dL  °  Comment: Glucose reference range applies only to samples taken after fasting for at least 8 hours.  °Culture,  Respiratory w Gram Stain     Status: None (Preliminary result)  ° Collection Time: 01/21/22  2:46 AM  ° Specimen: Tracheal Aspirate; Respiratory  °Result Value Ref Range  ° Specimen Description    °  TRACHEAL ASPIRATE °Performed at [facility], [address] [place], [place] [address] °  ° Special Requests    °  NONE °Performed at [facility], [address] [place], [place] [address] °  ° Gram Stain    °  ABUNDANT WBC PRESENT, PREDOMINANTLY PMN °ABUNDANT GRAM POSITIVE COCCI IN CLUSTERS °Performed at [facility], [address] [place], [place] [address] °  ° Culture PENDING   ° Report Status PENDING   °Glucose, capillary     Status: Abnormal  ° Collection Time: 01/21/22  3:50 AM  °Result Value Ref Range  ° Glucose-Capillary 125 (H) 70 - 99 mg/dL  °  Comment: Glucose reference range applies only to samples taken after fasting for at least 8 hours.  °Renal function panel     Status: Abnormal  ° Collection Time: 01/21/22  5:30 AM  °Result Value Ref Range  ° Sodium 137 135 - 145 mmol/L  ° Potassium 3.6 3.5 - 5.1 mmol/L  ° Chloride 105 98 - 111 mmol/L  ° CO2 24 22 - 32 mmol/L  ° Glucose, Bld 154 (H) 70 - 99 mg/dL  °  Comment: Glucose reference range applies only to samples taken after fasting for at least 8 hours.  ° BUN 33 (H) 8 - 23 mg/dL  ° Creatinine, Ser 4.95 (H) 0.44 - 1.00 mg/dL  ° Calcium 8.5 (L) 8.9 - 10.3 mg/dL  ° Phosphorus 4.5 2.5 - 4.6 mg/dL  ° Albumin 2.9 (L) 3.5 - 5.0 g/dL  ° GFR, Estimated 9 (L) >60 mL/min  °  Comment: (NOTE) °Calculated using the CKD-EPI Creatinine Equation (2021) °  ° Anion gap 8 5 - 15  °  Comment: Performed at [facility], [address] [place], [place] [address]  °Triglycerides     Status: Abnormal  ° Collection Time: 01/21/22  5:37 AM  °Result Value Ref Range  ° Triglycerides 251 (H) <150 mg/dL  °  Comment: Performed at [facility], [address] [place], [place] [address]  °CBC     Status: Abnormal  ° Collection Time:  01/21/22  5:37 AM  °Result Value Ref Range  ° WBC 15.1 (H) 4.0 - 10.5 K/uL  ° RBC 2.88 (L) 3.87 - 5.11 MIL/uL  ° Hemoglobin 8.5 (L) 12.0 - 15.0 g/dL  ° HCT 27.7 (L) 36.0 - 46.0 %  ° MCV 96.2 80.0 - 100.0 fL  ° MCH 29.5 26.0 - 34.0 pg  ° MCHC 30.7 30.0 - 36.0 g/dL  ° RDW 14.0 11.5 - 15.5 %  ° Platelets 216 150 - 400 K/uL  ° nRBC 0.0 0.0 - 0.2 %  °  Comment: Performed at  [facility]ospital [facility], [address] , [place] [address]  °Magnesium     Status: None  ° Collection Time: 01/21/22  5:37 AM  °Result Value Ref   Range   Magnesium 1.8 1.7 - 2.4 mg/dL    Comment: Performed at [facility], [address] [place], [place] [address]  Procalcitonin     Status: None   Collection Time: 01/21/22  5:37 AM  Result Value Ref Range   Procalcitonin 0.80 ng/mL    Comment:        Interpretation: PCT > 0.5 ng/mL and <= 2 ng/mL: Systemic infection (sepsis) is possible, but other conditions are known to elevate PCT as well. (NOTE)       Sepsis PCT Algorithm           Lower Respiratory Tract                                      Infection PCT Algorithm    ----------------------------     ----------------------------         PCT < 0.25 ng/mL                PCT < 0.10 ng/mL          Strongly encourage             Strongly discourage   discontinuation of antibiotics    initiation of antibiotics    ----------------------------     -----------------------------       PCT 0.25 - 0.50 ng/mL            PCT 0.10 - 0.25 ng/mL               OR       >80% decrease in PCT            Discourage initiation of                                            antibiotics      Encourage discontinuation           of antibiotics    ----------------------------     -----------------------------         PCT >= 0.50 ng/mL              PCT 0.26 - 0.50 ng/mL                AND       <80% decrease in PCT             Encourage initiation of                                             antibiotics       Encourage  continuation           of antibiotics    ----------------------------     -----------------------------        PCT >= 0.50 ng/mL                  PCT > 0.50 ng/mL               AND         increase in PCT                  Strongly encourage  initiation of antibiotics °   Strongly encourage escalation °          of antibiotics °                                    ----------------------------- °                                          PCT <= 0.25 ng/mL °                                                OR °                                       > 80% decrease in PCT ° °                                    Discontinue / Do not initiate °                                            antibiotics ° °Performed at [facility], [address] [place], °[place] [address] °  °Glucose, capillary     Status: Abnormal  ° Collection Time: 01/21/22  7:19 AM  °Result Value Ref Range  ° Glucose-Capillary 135 (H) 70 - 99 mg/dL  °  Comment: Glucose reference range applies only to samples taken after fasting for at least 8 hours.  °Glucose, capillary     Status: Abnormal  ° Collection Time: 01/21/22 11:14 AM  °Result Value Ref Range  ° Glucose-Capillary 152 (H) 70 - 99 mg/dL  °  Comment: Glucose reference range applies only to samples taken after fasting for at least 8 hours.  °Glucose, capillary     Status: Abnormal  ° Collection Time: 01/21/22  3:31 PM  °Result Value Ref Range  ° Glucose-Capillary 163 (H) 70 - 99 mg/dL  °  Comment: Glucose reference range applies only to samples taken after fasting for at least 8 hours.  °Glucose, capillary     Status: Abnormal  ° Collection Time: 01/21/22  8:20 PM  °Result Value Ref Range  ° Glucose-Capillary 149 (H) 70 - 99 mg/dL  °  Comment: Glucose reference range applies only to samples taken after fasting for at least 8 hours.  °Glucose, capillary     Status: Abnormal  ° Collection Time: 01/21/22 11:40 PM  °Result Value Ref Range  °  Glucose-Capillary 183 (H) 70 - 99 mg/dL  °  Comment: Glucose reference range applies only to samples taken after fasting for at least 8 hours.  °CBC     Status: Abnormal  ° Collection Time: 01/22/22  3:40 AM  °Result Value Ref Range  ° WBC 17.8 (H) 4.0 - 10.5 K/uL  ° RBC 2.81 (L) 3.87 - 5.11 MIL/uL  ° Hemoglobin 8.3 (L) 12.0 - 15.0 g/dL  ° HCT 27.1 (L)   36.0 - 46.0 %  ° MCV 96.4 80.0 - 100.0 fL  ° MCH 29.5 26.0 - 34.0 pg  ° MCHC 30.6 30.0 - 36.0 g/dL  ° RDW 14.2 11.5 - 15.5 %  ° Platelets 170 150 - 400 K/uL  ° nRBC 0.0 0.0 - 0.2 %  °  Comment: Performed at [facility], [address] [place], [place] [address]  °Renal function panel     Status: Abnormal  ° Collection Time: 01/22/22  3:40 AM  °Result Value Ref Range  ° Sodium 132 (L) 135 - 145 mmol/L  ° Potassium 3.6 3.5 - 5.1 mmol/L  ° Chloride 101 98 - 111 mmol/L  ° CO2 20 (L) 22 - 32 mmol/L  ° Glucose, Bld 230 (H) 70 - 99 mg/dL  °  Comment: Glucose reference range applies only to samples taken after fasting for at least 8 hours.  ° BUN 58 (H) 8 - 23 mg/dL  ° Creatinine, Ser 6.22 (H) 0.44 - 1.00 mg/dL  ° Calcium 8.8 (L) 8.9 - 10.3 mg/dL  ° Phosphorus 6.3 (H) 2.5 - 4.6 mg/dL  ° Albumin 2.6 (L) 3.5 - 5.0 g/dL  ° GFR, Estimated 7 (L) >60 mL/min  °  Comment: (NOTE) °Calculated using the CKD-EPI Creatinine Equation (2021) °  ° Anion gap 11 5 - 15  °  Comment: Performed at [facility], [address] [place], [place] [address]  °Procalcitonin     Status: None  ° Collection Time: 01/22/22  3:40 AM  °Result Value Ref Range  ° Procalcitonin 1.86 ng/mL  °  Comment:        °Interpretation: °PCT > 0.5 ng/mL and <= 2 ng/mL: °Systemic infection (sepsis) is possible, °but other conditions are known to elevate °PCT as well. °(NOTE) °      Sepsis PCT Algorithm           Lower Respiratory Tract °                                     Infection PCT Algorithm °   ----------------------------     ---------------------------- °        PCT < 0.25 ng/mL                 PCT < 0.10 ng/mL ° °        Strongly encourage             Strongly discourage °  discontinuation of antibiotics    initiation of antibiotics °   ----------------------------     ----------------------------- °      PCT 0.25 - 0.50 ng/mL            PCT 0.10 - 0.25 ng/mL °              OR °      >80% decrease in PCT            Discourage initiation of °                                           antibiotics °     Encourage discontinuation °          of antibiotics °   ----------------------------     ----------------------------- °          PCT >= 0.50 ng/mL              PCT 0.26 - 0.50 ng/mL                AND       <80% decrease in PCT             Encourage initiation of                                             antibiotics       Encourage continuation           of antibiotics    ----------------------------     -----------------------------        PCT >= 0.50 ng/mL                  PCT > 0.50 ng/mL               AND         increase in PCT                  Strongly encourage                                      initiation of antibiotics    Strongly encourage escalation           of antibiotics                                     -----------------------------                                           PCT <= 0.25 ng/mL                                                 OR                                        > 80% decrease in PCT                                      Discontinue / Do not initiate                                             antibiotics  Performed at [facility], [address] [place], [place] [address]   Magnesium     Status: None   Collection Time: 01/22/22  3:40 AM  Result Value Ref Range   Magnesium 1.8 1.7 - 2.4 mg/dL    Comment: Performed at [facility], [address] [place], [place] [address]  Glucose, capillary  Status: Abnormal   Collection Time: 01/22/22  3:48 AM  Result Value Ref Range   Glucose-Capillary 201 (H) 70 - 99 mg/dL     Comment: Glucose reference range applies only to samples taken after fasting for at least 8 hours.  Glucose, capillary     Status: Abnormal   Collection Time: 01/22/22  7:35 AM  Result Value Ref Range   Glucose-Capillary 218 (H) 70 - 99 mg/dL    Comment: Glucose reference range applies only to samples taken after fasting for at least 8 hours.    Recent Results (from the past 240 hour(s))  Resp Panel by RT-PCR (Flu A&B, Covid) Nasopharyngeal Swab     Status: None   Collection Time: 01/22/2022  8:30 PM   Specimen: Nasopharyngeal Swab; Nasopharyngeal(NP) swabs in vial transport medium  Result Value Ref Range Status   SARS Coronavirus 2 by RT PCR NEGATIVE NEGATIVE Final    Comment: (NOTE) SARS-CoV-2 target nucleic acids are NOT DETECTED.  The SARS-CoV-2 RNA is generally detectable in upper respiratory specimens during the acute phase of infection. The lowest concentration of SARS-CoV-2 viral copies this assay can detect is 138 copies/mL. A negative result does not preclude SARS-Cov-2 infection and should not be used as the sole basis for treatment or other patient management decisions. A negative result may occur with  improper specimen collection/handling, submission of specimen other than nasopharyngeal swab, presence of viral mutation(s) within the areas targeted by this assay, and inadequate number of viral copies(<138 copies/mL). A negative result must be combined with clinical observations, patient history, and epidemiological information. The expected result is Negative.  Fact Sheet for Patients:  EntrepreneurPulse.com.au  Fact Sheet for Healthcare Providers:  IncredibleEmployment.be  This test is no t yet approved or cleared by the [place] FDA and  has been authorized for detection and/or diagnosis of SARS-CoV-2 by FDA under an Emergency Use Authorization (EUA). This EUA will remain  in effect (meaning this test can be used) for the  duration of the COVID-19 declaration under Section 564(b)(1) of the Act, 21 U.S.C.section 360bbb-3(b)(1), unless the authorization is terminated  or revoked sooner.       Influenza A by PCR NEGATIVE NEGATIVE Final   Influenza B by PCR NEGATIVE NEGATIVE Final    Comment: (NOTE) The Xpert Xpress SARS-CoV-2/FLU/RSV plus assay is intended as an aid in the diagnosis of influenza from Nasopharyngeal swab specimens and should not be used as a sole basis for treatment. Nasal washings and aspirates are unacceptable for Xpert Xpress SARS-CoV-2/FLU/RSV testing.  Fact Sheet for Patients: EntrepreneurPulse.com.au  Fact Sheet for Healthcare Providers: IncredibleEmployment.be  This test is not yet approved or cleared by the [place] FDA and has been authorized for detection and/or diagnosis of SARS-CoV-2 by FDA under an Emergency Use Authorization (EUA). This EUA will remain in effect (meaning this test can be used) for the duration of the COVID-19 declaration under Section 564(b)(1) of the Act, 21 U.S.C. section 360bbb-3(b)(1), unless the authorization is terminated or revoked.  Performed at [facility], [address] [place], [place] [address]   MRSA Next Gen by PCR, Nasal     Status: None   Collection Time: 01/20/22  8:50 AM   Specimen: Nasal Mucosa; Nasal Swab  Result Value Ref Range Status   MRSA by PCR Next Gen NOT DETECTED NOT DETECTED Final    Comment: (NOTE) The GeneXpert MRSA Assay (FDA approved for NASAL specimens only), is one component of a comprehensive MRSA colonization surveillance program.  It is not intended to diagnose MRSA infection nor to guide or monitor treatment for MRSA infections. Test performance is not FDA approved in patients less than 19 years old. Performed at [facility], [address] [place], [place] [address]   Culture, Respiratory w Gram Stain     Status: None (Preliminary result)    Collection Time: 01/21/22  2:46 AM   Specimen: Tracheal Aspirate; Respiratory  Result Value Ref Range Status   Specimen Description   Final    TRACHEAL ASPIRATE Performed at [facility], [address], [place], [place] [address]    Special Requests   Final    NONE Performed at [facility], [address] [place], [place] [address]    Gram Stain   Final    ABUNDANT WBC PRESENT, PREDOMINANTLY PMN ABUNDANT GRAM POSITIVE COCCI IN CLUSTERS Performed at [facility], [place] [address], [place], [place] [address]    Culture PENDING  Incomplete   Report Status PENDING  Incomplete    Lipid Panel Recent Labs    01/21/22 0537  TRIG 251*    Studies/Results: DG Abd 1 View  Result Date: 01/20/2022 CLINICAL DATA:  Nasogastric tube placement. EXAM: ABDOMEN - 1 VIEW COMPARISON:  Chest radiographs today and 01/12/2022. FINDINGS: 1026 hours. Nasogastric tube projects below the diaphragm, tip projected over the mid stomach. The visualized bowel gas pattern is normal. Asymmetric left basilar pulmonary opacity, likely atelectasis with a possible small pleural effusion. IMPRESSION: Nasogastric tube projects over the mid stomach. Electronically Signed   By: Richardean Sale M.D.   On: 01/20/2022 10:49   MR BRAIN WO CONTRAST  Result Date: 01/21/2022 CLINICAL DATA:  Altered mental status EXAM: MRI HEAD WITHOUT CONTRAST TECHNIQUE: Multiplanar, multiecho pulse sequences of the brain and surrounding structures were obtained without intravenous contrast. COMPARISON:  CT head 01/13/2022 FINDINGS: Brain: There is a punctate acute to early subacute infarct in the left inferior frontal gyrus. There is a small acute to subacute infarct in the right posterior temporal lobe periventricular white matter (5-27). There is a punctate acute to subacute infarct in the left occipital lobe (5-19). There is no associated hemorrhage or mass effect. There is no other evidence of acute infarct. There is  no acute intracranial hemorrhage or extra-axial fluid collection Background parenchymal volume is normal. The ventricles are normal in size. Patchy foci of FLAIR signal abnormality in the subcortical and periventricular white matter likely reflects sequela of mild chronic white matter microangiopathy. There is no mass lesion.  There is no midline shift. Vascular: Normal flow voids. Skull and upper cervical spine: Thickening of the right occipital cortex is again seen, without aggressive features. There is no suspicious marrow signal abnormality. Sinuses/Orbits: The paranasal sinuses are clear. The globes and orbits are unremarkable. Other: None. IMPRESSION: 1. 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 No associated hemorrhage or mass effect. 2. Mild chronic white matter microangiopathy. Electronically Signed   By: Valetta Mole M.D.   On: 01/21/2022 15:17   DG Chest Port 1 View  Result Date: 01/21/2022 CLINICAL DATA:  Acute respiratory failure EXAM: PORTABLE CHEST 1 VIEW COMPARISON:  Film from the previous day. FINDINGS: Endotracheal tube, gastric catheter and left jugular central line are again seen and stable. Cardiac shadow is stable. Lungs are well aerated bilaterally with mild basilar atelectasis. No other focal abnormality is noted. IMPRESSION: Mild bibasilar atelectasis. Tubes and lines  in satisfactory position. Electronically Signed   By: Mark  Lukens M.D.   On: 01/21/2022 01:21  ° °Portable Chest x-ray ° °Result Date: 01/20/2022 °CLINICAL DATA:  Central line placement. Endotracheal tube placement. EXAM: PORTABLE CHEST 1 VIEW COMPARISON:  Radiographs 01/16/2022 FINDINGS: 1027 hours. Endotracheal tube tip overlies the mid trachea. There is a new left IJ central venous catheter which has an atypical course, projecting over the aortic arch with its tip overlying the right mainstem bronchus. A nasogastric  tube projects below the diaphragm, tip not visualized on this examination. The heart size and mediastinal contours are stable allowing for mild patient rotation. No evidence of mediastinal hematoma. There are increased opacities at both lung bases, likely atelectasis. There is a small left pleural effusion. No evidence of pneumothorax. IMPRESSION: 1. New left IJ central venous catheter follows an atypical course and cannot be confirmed to be intravenous by a single AP radiograph. Tip overlies the right mainstem bronchus. Correlate for venous blood return from the catheter. 2. Satisfactory position of the endotracheal tube. 3. Increased bibasilar atelectasis.  No pneumothorax. 4. These results will be called to the ordering clinician or representative by the Radiologist Assistant, and communication documented in the PACS or [facility] Dashboard. Electronically Signed   By: William  Veazey M.D.   On: 01/20/2022 10:53  ° °EEG adult ° °Result Date: 01/21/2022 °Yadav, Priyanka O, MD     01/21/2022  8:17 AM Patient Name: Tawanna Lynn Grissett MRN: [id number] Epilepsy Attending: Priyanka O Yadav Referring Physician/Provider: , , MD Date: 01/20/2022 Duration: 22.40 mins Patient history: 65 year old female with persistent AMS since admission on Tuesday.  EEG to evaluate for seizure. Level of alertness: Awake, asleep AEDs during EEG study: Propofol Technical aspects: This EEG study was done with scalp electrodes positioned according to the 10-20 International system of electrode placement. Electrical activity was acquired at a sampling rate of 500Hz and reviewed with a high frequency filter of 70Hz and a low frequency filter of 1Hz. EEG data were recorded continuously and digitally stored. Description: No clear posterior dominant rhythm was seen.Sleep was characterized by vertex waves, sleep spindles (12 to 14 Hz), maximal frontocentral region. EEG showed continuous generalized 5 to 7 Hz theta slowing admixed with intermittent  2-3hz delta slowing.Hyperventilation and photic stimulation were not performed.   ABNORMALITY - Continuous slow, generalized IMPRESSION: This study is suggestive of moderate diffuse encephalopathy, nonspecific etiology. No seizures or epileptiform discharges were seen throughout the recording. Priyanka O Yadav   ° °Medications: Scheduled: ° albuterol  2.5 mg Nebulization Q6H  ° amLODipine  10 mg Per Tube Daily  ° chlorhexidine gluconate (MEDLINE KIT)  15 mL Mouth Rinse BID  ° Chlorhexidine Gluconate Cloth  6 each Topical Q0600  ° docusate  100 mg Per Tube BID  ° [START ON 01/23/2022] epoetin (EPOGEN/PROCRIT) injection  4,000 Units Intravenous Q M,W,F-HD  ° feeding supplement (PROSource TF)  90 mL Per Tube QID  ° feeding supplement (VITAL HIGH PROTEIN)  1,000 mL Per Tube Q24H  ° free water  30 mL Per Tube Q4H  ° heparin  5,000 Units Subcutaneous Q8H  ° hydrALAZINE  25 mg Per Tube Q8H  ° insulin aspart  0-15 Units Subcutaneous Q4H  ° levothyroxine  100 mcg Intravenous Daily  ° mouth rinse  15 mL Mouth Rinse 10 times per day  ° multivitamin  1 tablet Per Tube QHS  ° multivitamin  15 mL Per Tube Daily  ° pantoprazole (PROTONIX) IV  40 mg   Intravenous Daily  ° polyethylene glycol  17 g Per Tube Daily  ° risperiDONE  0.5 mg Per Tube TID  ° rOPINIRole  0.25 mg Per Tube QHS  ° °Continuous: ° sodium chloride    ° sodium chloride    ° sodium chloride Stopped (01/20/22 1400)  ° fentaNYL infusion INTRAVENOUS Stopped (01/22/22 0600)  ° norepinephrine (LEVOPHED) Adult infusion 2 mcg/min (01/22/22 0633)  ° propofol (DIPRIVAN) infusion 10 mcg/kg/min (01/22/22 0600)  ° thiamine injection Stopped (01/21/22 1152)  ° ° °Assessment: 65 year old female who presented to the hospital with severe metabolic derangements in the setting of missed hemodialysis sessions. She was subsequently intubated for respiratory distress. She has had persistent AMS since admission on Tuesday.  °1. Exam continues to show findings consistent with diffuse  cerebral hypofunction in the context of sedation and is not significantly changed since Friday. °2. Agree with Psychiatry that metabolic abnormalities due to failure to keep up with dialysis are most likely contributing to the patient's AMS. Also agree with possible contribution from severe hypothyroidism.   °3. Head CT: No acute intracranial abnormality. Mild chronic small vessel ischemia °4. EEG: Finding of continuous generalized slowing is suggestive of moderate diffuse encephalopathy, nonspecific to etiology. No seizures or epileptiform discharges were seen throughout the recording. °5. MRI brain: Punctate acute to subacute infarcts in the left inferior frontal °gyrus, left occipital lobe, and right temporal lobe periventricular white matter. No associated hemorrhage or mass effect. Mild chronic white matter microangiopathy. °6. Although the tiny foci of acute infarction are not felt to be contributing to her AMS, will need stroke work up.  °   °Recommendations: °1. TTE °2. MRA head when able °3. Carotid ultrasound °4. Continue cardiac telemetry °5. Starting ASA °6. BP management per primary team. No indication for permissive HTN.  °7. Agree with Psychiatry that goal of treatment would be to relieve all possible medical conditions that could be contributing. °8. Agree with Psychiatry medication recommendations.  °  °35 minutes spent in the neurological evaluation and management of this critically ill patient.  ° ° LOS: 5 days  ° °@Electronically signed: Dr.  @ °01/22/2022  9:07 AM ° °  °

## 2022-01-22 NOTE — TOC Progression Note (Addendum)
Transition of Care [facility]) - Progression Note    Patient Details  Name: Regina Jackson MRN: [id number] Date of Birth: 08-19-1956  Transition of Care [facility]) CM/SW [place], [place] Phone Number: 01/22/2022, 11:53 AM  Clinical Narrative:     Patient is currently sedated and intubated. Main contact Regina Jackson (Spouse) [phone]. TOC will continue to follow.  Confirmed with attending IVC will be rescinded.    Expected Discharge Plan:  (TBD) Barriers to Discharge: Continued Medical Work up  Expected Discharge Plan and Services Expected Discharge Plan:  (TBD)   Discharge Planning Services: CM Consult   Living arrangements for the past 2 months: Single Family Home                 DME Arranged: N/A DME Agency: NA                   Social Determinants of Health (SDOH) Interventions    Readmission Risk Interventions No flowsheet data found.

## 2022-01-22 NOTE — Progress Notes (Signed)
NAME:  Regina Jackson, MRN:  [id number], DOB:  March 31, 1956, LOS: 5 ADMISSION DATE:  01/18/2022, CONSULTATION DATE:  01/20/22 REFERRING MD:  Dr. Mal Misty, CHIEF COMPLAINT:  AMS, Acute Respiratory Distress   Brief Pt Description / Synopsis:  66 y.o. Female with PMH significant for ESRD on HD, admitted with Acute Metabolic Encephalopathy due to multiple severe metabolic derangements in setting of missed HD sessions and Hypothyroidism (query ? Myxedema coma).  Developed acute respiratory distress requiring intubation and mechanical ventilation.  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  Patient had no chest pain, shortness of breath, abdominal pain, nausea, vomiting, diarrhea.  The patient's husband reported she had not been compliant with her home hemodialysis in nearly 8 days, also unsure if she has been taking her thyroid medication.  ED Course: Initial vital signs: Temperature 97.7 orally, respiratory 15, pulse 96, blood pressure 199/163 Significant labs: Sodium 134, bicarbonate 16, glucose 211, BUN 49, creatinine 5.9, alkaline phosphatase 132, BNP 329, high-sensitivity troponin 42, WBC 11.3, hemoglobin 10.9, TSH 58.8 Venous blood gas: pH 7.3/PCO2 33/PO2 54/bicarbonate 16.2 COVID-19 PCR negative Imaging: Chest x-ray:Mild cardiomegaly with vascular congestion. Possible small left effusion. No consolidation or pneumothorax CT head:IMPRESSION: 1. No acute intracranial abnormality. 2. Mild chronic small vessel ischemia.   She was placed under IVC, and the hospitalist were asked to admit.  Nephrology was consulted for dialysis during her stay.  However given her agitation and confusion, dialysis  was unable to's be performed safely therefore he was deferred for reevaluation in the next day.  Interval History She was given IV Haldol and Valium with little improvement in her agitation.  Psychiatry was consulted.  On 01/20/2022 she remained altered, but developed acute respiratory distress requiring transfer to ICU and emergent endotracheal intubation and mechanical ventilation.   Pertinent  Medical History  ESRD on hemodialysis Noncompliance with dialysis Anemia of chronic disease Endometrial cancer Hypothyroidism Hypertension diabetes mellitus  Micro Data:  1/24: SARS-CoV-2 and influenza PCR>> negative 1/25: HIV screen>> nonreactive 1/27: MRSA PCR>> negative 1/27: Tracheal aspirate>>abundant GPC, Staph aureus, sensitivities pending  Antimicrobials:  1/29 vancomycin>>  Significant Hospital Events: Including procedures, antibiotic start and stop dates in addition to other pertinent events   1/24: Admitted by the hospitalist for altered mental status and hypertensive urgency.  Nephrology consulted 1/25: Unable to safely perform dialysis due to severe agitation and confusion.  Plan to reassess HD tomorrow 1/26: Received IV Haldol and Valium without improvement in mental status.  Psychiatry consulted 1/27: Required emergent intubation due to AMS and Acute Respiratory Distress.  MRI and EEG pending, Neuro consulted.  Plan for HD now that pt is sedated 1/28 remains on vent,poorly responsive on SAT, MRI today 1/29 copious ETT secretions, started Levaquin (allergies to ampicillin/Unasyn), cardiogram done, pending  Interim History / Subjective:  Poorly responsive on the ventilator Very copious secretions noted from ETT Continue supportive care,off of pressors  Objective   Blood pressure (!) 121/48, pulse 100, temperature 100.1 F (37.8 C), resp. rate 17, height 5' 7.52" (1.715 m), weight 114.4 kg, SpO2 93 %.    Vent Mode: PRVC FiO2 (%):  [40 %] 40 % Set Rate:  [20 bmp] 20  bmp Vt Set:  [420 mL] 420 mL PEEP:  [5  [place] Pressure:  [17 cmH20] 17 cmH20   Intake/Output Summary (Last 24 hours) at 01/22/2022 1118 Last data filed at 01/22/2022 0800 Gross per 24 hour  Intake 1298.21 ml  Output 45 ml  Net 1253.21 ml    Filed Weights   01/20/22 0837 01/20/22 1332 01/20/22 1752  Weight: 108.1 kg 108.1 kg 114.4 kg    Examination: General: Critically ill-appearing female, laying in bed, sedated endotracheally intubated, synchronous with the vent HEENT: Atraumatic, normocephalic, neck supple, positive JVD Lungs: Coarse breath sounds bilaterally, synchronous with the vent.  Copious tan secretions from ET tube Cardiovascular: Regular rate, regular rhythm, S1-S2, grade II/VI harsh murmur LSB Abdomen: Obese, soft,non distended, bowel sounds positive  Extremities: No deformities, 1+ edema bilateral lower extremities Skin: Multiple ecchymoses throughout particularly on top of feet Neuro: Sedated, cannot assess further  Resolved Hospital Problem list     Assessment & Plan:   Acute Hypoxic Respiratory Failure in the setting of severe metabolic derangements Inability to protect airway -Full vent support, implement lung protective strategies -Plateau pressures less than 30 cm H20 -Wean FiO2 & PEEP as tolerated to maintain O2 sats >92% -Follow intermittent Chest X-ray & ABG as needed -Spontaneous Breathing Trials when respiratory parameters met and mental status permits -Implement VAP Bundle -PRN Bronchodilators -Cultured tracheal aspirate 1/28, copious beige to greenish secretions  ESRD on Hemodialysis Mild Hyperkalemia Metabolic Acidosis -Monitor I&O's / urinary output -Follow BMP -Ensure adequate renal perfusion -Avoid nephrotoxic agents as able -Replace electrolytes as indicated -Nephrology following, appreciate input -HD as per Nephrology  Leukocytosis, unclear etiology ? Aspiration -Monitor fever curve -Trend WBC's &  Procalcitonin -Follow cultures as above -UA not concerning for UTI -CXR with increased opacities at both lung bases, likely atelectasis -Procalcitonin 1.86, start ABX, GPC's on sputum initiated vancomycin 1/29 -Copious tan sputum, previous culture positive for GPC's now unknown Staph aureus, sensitivities pending  Diabetes Mellitus Hypothyroidism, query ? Myxedema Coma PMHx: Radioactive Iodine Thyroid ablation -CBG's q4h; Target range of 140 to 180 -SSI -Follow ICU Hypo/Hyperglycemia protocol -Continue IV Synthroid 100 mg daily (received 200 mg x1 earlier in stay) -Follow TSH and thyroid panel  Acute Metabolic Encephalopathy in setting of multiple severe metabolic derangements (AKI and uremia, Hypothyroidism with ? Myxedema coma Multiple punctate acute to subacute Sedation needs in the setting of mechanical ventilation -Maintain a RASS goal of 0 to -1 -Fentanyl and Propofol to maintain RASS goal -Avoid sedating medications as able -Daily wake up assessment -Initial CT Head negative in ED -Obtain MRI and EEG -Consulted Neurology, appreciate input -Correction of multiple metabolic derangements as above -Thiamine IV -Synthroid IV  ESRD on Hemodialysis Mild Hyperkalemia Anion Gap Metabolic Acidosis -Monitor I&O's / urinary output -Follow BMP -Ensure adequate renal perfusion -Avoid nephrotoxic agents as able -Replace electrolytes as indicated -Nephrology following, appreciate input -HD as per Nephrology  Anemia of chronic disease -Monitor for S/Sx of bleeding -Trend CBC -Heparin SQ for VTE Prophylaxis  -Transfuse for Hgb <7     Best Practice (right click and "Reselect all SmartList Selections" daily)   Diet/type: NPO DVT prophylaxis: prophylactic heparin  GI prophylaxis: PPI Lines: Central line and yes and it is still needed Foley:  Yes, and it is still needed Code Status:  full code Last date of multidisciplinary goals of care discussion [1/27]    Labs    CBC: Recent Labs  Lab 01/03/2022 1857 01/19/22 0918 01/20/22 0417 01/20/22 1058 01/21/22 0537 01/22/22 0340  WBC 11.3* 14.0* 17.7* 17.7* 15.1* 17.8*  NEUTROABS 8.9* 12.1* 15.3*  --   --   --   HGB 10.9* 10.2* 9.9* 9.1* 8.5* 8.3*  HCT 33.7* 31.6* 31.5* 29.3* 27.7* 27.1*  MCV 92.1 91.1 92.6 95.1 96.2 96.4  PLT 281 260 285 243 216 170     Basic Metabolic Panel: Recent Labs  Lab 01/19/22 0918 01/20/22 0417 01/20/22 1058 01/21/22 0530 01/21/22 0537 01/22/22 0340  NA 138 142 140 137  --  132*  K 4.7 5.0 5.3* 3.6  --  3.6  CL 110 112* 112* 105  --  101  CO2 13* 13* 16* 24  --  20*  GLUCOSE 188* 182* 233* 154*  --  230*  BUN 53* 61* 63* 33*  --  58*  CREATININE 6.39* 6.88* 7.37* 4.95*  --  6.22*  CALCIUM 9.6 9.6 8.8* 8.5*  --  8.8*  MG  --   --  2.6*  --  1.8 1.8  PHOS  --   --   --  4.5  --  6.3*    GFR: Estimated Creatinine Clearance: 12.2 mL/min (A) (by C-G formula based on SCr of 6.22 mg/dL (H)). Recent Labs  Lab 01/20/22 0417 01/20/22 1058 01/20/22 2252 01/21/22 0537 01/22/22 0340  PROCALCITON  --   --  0.56 0.80 1.86  WBC 17.7* 17.7*  --  15.1* 17.8*     Liver Function Tests: Recent Labs  Lab 12/30/2021 1857 01/19/22 0918 01/20/22 0417 01/20/22 1058 01/21/22 0530 01/22/22 0340  AST 39 50* 79* 67*  --   --   ALT 22 27 39 37  --   --   ALKPHOS 132* 119 126 114  --   --   BILITOT 0.8 0.8 0.8 0.7  --   --   PROT 7.6 7.1 7.4 6.7  --   --   ALBUMIN 3.5 3.3* 3.6 3.0* 2.9* 2.6*    No results for input(s): LIPASE, AMYLASE in the last 168 hours. Recent Labs  Lab 01/19/22 0918  AMMONIA 16     ABG    Component Value Date/Time   PHART 7.39 01/20/2022 2108   PCO2ART 41 01/20/2022 2108   PO2ART 143 (H) 01/20/2022 2108   HCO3 24.8 01/20/2022 2108   ACIDBASEDEF 0.2 01/20/2022 2108   O2SAT 99.2 01/20/2022 2108      Coagulation Profile: No results for input(s): INR, PROTIME in the last 168 hours.  Cardiac Enzymes: No results for input(s):  CKTOTAL, CKMB, CKMBINDEX, TROPONINI in the last 168 hours.  HbA1C: Hgb A1c MFr Bld  Date/Time Value Ref Range Status  01/18/2022 06:25 AM 9.2 (H) 4.8 - 5.6 % Final    Comment:    (NOTE)         Prediabetes: 5.7 - 6.4         Diabetes: >6.4         Glycemic control for adults with diabetes: <7.0     CBG: Recent Labs  Lab 01/21/22 1531 01/21/22 2020 01/21/22 2340 01/22/22 0348 01/22/22 0735  GLUCAP 163* 149* 183* 201* 218*    MRI brain 1/28: IMPRESSION: 1. 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 No associated hemorrhage or mass effect. 2. Mild chronic white matter microangiopathy.  2D echo performed today, interpretation pending.   Review of Systems:   Unable to assess due to AMS/mechanically ventilated status  Allergies Allergies  Allergen Reactions   Amoxicillin    Augmentin [Amoxicillin-Pot  Clavulanate]    Ceftin [Cefuroxime]    Compazine [Prochlorperazine]    Demerol [Meperidine Hcl]    Macrobid [Nitrofurantoin]    Phenergan [Promethazine]    Sulfa Antibiotics      Medications   Scheduled Meds:  albuterol  2.5 mg Nebulization Q6H   amLODipine  10 mg Per Tube Daily   aspirin  81 mg Per Tube Daily   chlorhexidine gluconate (MEDLINE KIT)  15 mL Mouth Rinse BID   Chlorhexidine Gluconate Cloth  6 each Topical Q0600   docusate  100 mg Per Tube BID   [START ON 01/23/2022] epoetin (EPOGEN/PROCRIT) injection  4,000 Units Intravenous Q M,W,F-HD   feeding supplement (PROSource TF)  90 mL Per Tube QID   feeding supplement (VITAL HIGH PROTEIN)  1,000 mL Per Tube Q24H   free water  30 mL Per Tube Q4H   heparin  5,000 Units Subcutaneous Q8H   hydrALAZINE  25 mg Per Tube Q8H   insulin aspart  0-15 Units Subcutaneous Q4H   levothyroxine  100 mcg Intravenous Daily   mouth rinse  15 mL Mouth Rinse 10 times per day   multivitamin  1 tablet Per Tube QHS    multivitamin  15 mL Per Tube Daily   pantoprazole (PROTONIX) IV  40 mg Intravenous Daily   polyethylene glycol  17 g Per Tube Daily   risperiDONE  0.5 mg Per Tube TID   rOPINIRole  0.25 mg Per Tube QHS   Continuous Infusions:  sodium chloride     sodium chloride     sodium chloride Stopped (01/20/22 1400)   fentaNYL infusion INTRAVENOUS 75 mcg/hr (01/22/22 1031)   norepinephrine (LEVOPHED) Adult infusion Stopped (01/22/22 0900)   propofol (DIPRIVAN) infusion 10 mcg/kg/min (01/22/22 1030)   thiamine injection 500 mg (01/22/22 1005)   PRN Meds:.sodium chloride, sodium chloride, acetaminophen **OR** acetaminophen, alteplase, heparin, labetalol, lidocaine (PF), lidocaine-prilocaine, ondansetron **OR** ondansetron (ZOFRAN) IV, pentafluoroprop-tetrafluoroeth   Critical care time: 45 minutes    The patient is critically ill with multiple organ systems failure and requires high complexity decision making for assessment and support, frequent evaluation and titration of therapies, application of advanced monitoring technologies and extensive interpretation of multiple databases. Critical Care Time devoted to patient care services described in this note is 45 minutes.   Renold Don, MD Advanced Bronchoscopy PCCM [facility]    *This note was dictated using voice recognition software/Dragon.  Despite best efforts to proofread, errors can occur which can change the meaning. Any transcriptional errors that result from this process are unintentional and may not be fully corrected at the time of dictation.

## 2022-01-22 NOTE — Progress Notes (Signed)
Updated patient's husband and daughter at bedside.  Patient was transitioning care from [place] to this area.  According to family she had been having more difficulties with issues with sleep apnea and not being able to sleep well.  Had not been taking her medications as indicated.  Had not been following up with dialysis as scheduled.  I have reviewed outside records from [place].  Refer to [facility] sleep lab outpatient note dated 26 July 2021 on "care everywhere" records.  She has an extensive and complex medical history.  Has been declining further in health particularly over the last year.  She has had hypothyroidism longstanding after radiation of the thyroid in the past.  Family could not provide any further history.  It appears patient has become more "secretive" about her health and they have had difficulties ensuring compliance with medications with the patient.  His family was updated with regards to findings thus far.   Renold Don, MD Advanced Bronchoscopy PCCM [facility]

## 2022-01-23 ENCOUNTER — Inpatient Hospital Stay: Payer: BLUE CROSS/BLUE SHIELD

## 2022-01-23 DIAGNOSIS — G9341 Metabolic encephalopathy: Secondary | ICD-10-CM | POA: Diagnosis not present

## 2022-01-23 DIAGNOSIS — J9601 Acute respiratory failure with hypoxia: Secondary | ICD-10-CM

## 2022-01-23 DIAGNOSIS — Z515 Encounter for palliative care: Secondary | ICD-10-CM

## 2022-01-23 DIAGNOSIS — Z992 Dependence on renal dialysis: Secondary | ICD-10-CM

## 2022-01-23 DIAGNOSIS — Z7189 Other specified counseling: Secondary | ICD-10-CM

## 2022-01-23 LAB — CULTURE, RESPIRATORY W GRAM STAIN

## 2022-01-23 LAB — RENAL FUNCTION PANEL
Albumin: 2.3 g/dL — ABNORMAL LOW (ref 3.5–5.0)
Anion gap: 14 (ref 5–15)
BUN: 86 mg/dL — ABNORMAL HIGH (ref 8–23)
CO2: 22 mmol/L (ref 22–32)
Calcium: 8.8 mg/dL — ABNORMAL LOW (ref 8.9–10.3)
Chloride: 99 mmol/L (ref 98–111)
Creatinine, Ser: 7.47 mg/dL — ABNORMAL HIGH (ref 0.44–1.00)
GFR, Estimated: 6 mL/min — ABNORMAL LOW (ref >60)
Glucose, Bld: 235 mg/dL — ABNORMAL HIGH (ref 70–99)
Phosphorus: 7.1 mg/dL — ABNORMAL HIGH (ref 2.5–4.6)
Potassium: 3.7 mmol/L (ref 3.5–5.1)
Sodium: 135 mmol/L (ref 135–145)

## 2022-01-23 LAB — BLOOD GAS, ARTERIAL
Acid-base deficit: 11.7 mmol/L — ABNORMAL HIGH (ref 0.0–2.0)
Bicarbonate: 16 mmol/L — ABNORMAL LOW (ref 20.0–28.0)
FIO2: 60
MECHVT: 420 mL
Mechanical Rate: 20
O2 Saturation: 93.1 %
PEEP: 8 cmH2O
Patient temperature: 37
pCO2 arterial: 43 mmHg (ref 32.0–48.0)
pH, Arterial: 7.18 — CL (ref 7.350–7.450)
pO2, Arterial: 84 mmHg (ref 83.0–108.0)

## 2022-01-23 LAB — GLUCOSE, CAPILLARY
Glucose-Capillary: 205 mg/dL — ABNORMAL HIGH (ref 70–99)
Glucose-Capillary: 263 mg/dL — ABNORMAL HIGH (ref 70–99)
Glucose-Capillary: 273 mg/dL — ABNORMAL HIGH (ref 70–99)
Glucose-Capillary: 269 mg/dL — ABNORMAL HIGH (ref 70–99)
Glucose-Capillary: 228 mg/dL — ABNORMAL HIGH (ref 70–99)

## 2022-01-23 LAB — TSH: TSH: 9.843 u[IU]/mL — ABNORMAL HIGH (ref 0.350–4.500)

## 2022-01-23 LAB — MAGNESIUM: Magnesium: 1.7 mg/dL (ref 1.7–2.4)

## 2022-01-23 LAB — CBC
HCT: 27.6 % — ABNORMAL LOW (ref 36.0–46.0)
Hemoglobin: 8.6 g/dL — ABNORMAL LOW (ref 12.0–15.0)
MCH: 30.4 pg (ref 26.0–34.0)
MCHC: 31.2 g/dL (ref 30.0–36.0)
MCV: 97.5 fL (ref 80.0–100.0)
Platelets: 183 10*3/uL (ref 150–400)
RBC: 2.83 MIL/uL — ABNORMAL LOW (ref 3.87–5.11)
RDW: 14.1 % (ref 11.5–15.5)
WBC: 20.2 10*3/uL — ABNORMAL HIGH (ref 4.0–10.5)
nRBC: 0.3 % — ABNORMAL HIGH (ref 0.0–0.2)

## 2022-01-23 IMAGING — MR MR MRA HEAD W/O CM
1 series · 19 of 48 positions shown · non-contrast
Comparison: No prior MRI, correlation is made with MRI [DATE]

CLINICAL DATA: Neuro deficit, stroke suspected

EXAM:
MRA HEAD WITHOUT CONTRAST
TECHNIQUE: Angiographic images of the Circle of Willis were acquired using MRA
technique without intravenous contrast.

[Series 5: TOF · axial · 0.5mm · 0.41mm/px · z∈[-134,-37]mm · 19 of 205 slices shown]
[im 1/205]
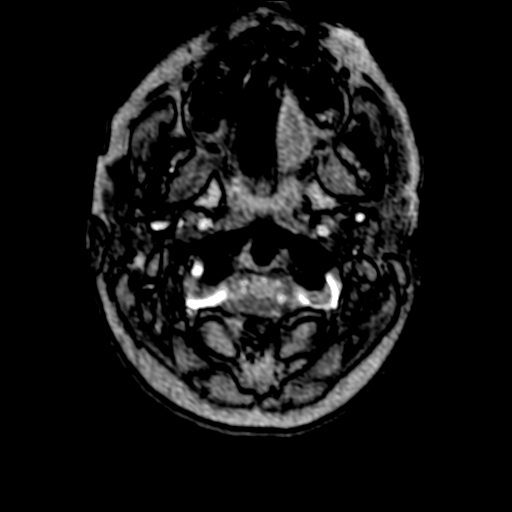
[im 5/205]
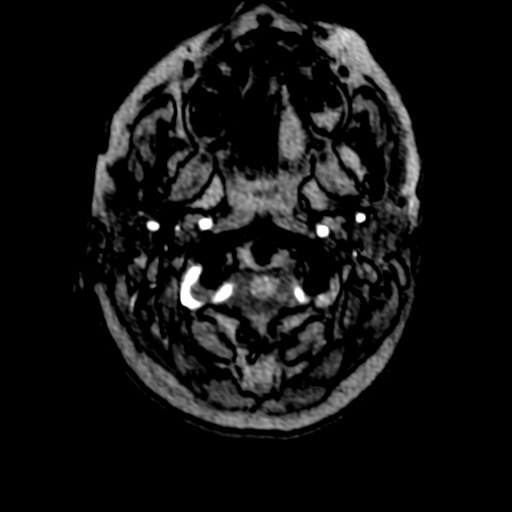
[im 9/205]
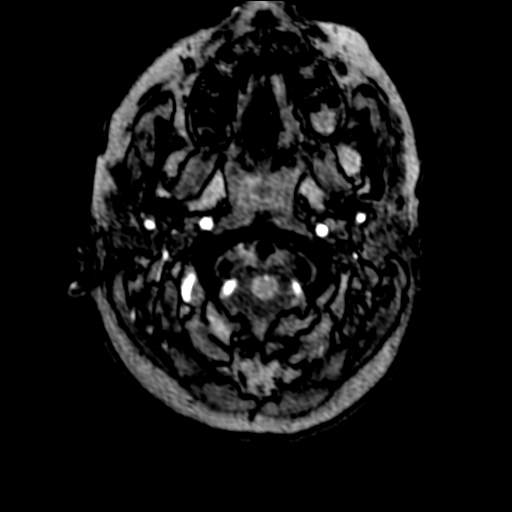
[im 14/205]
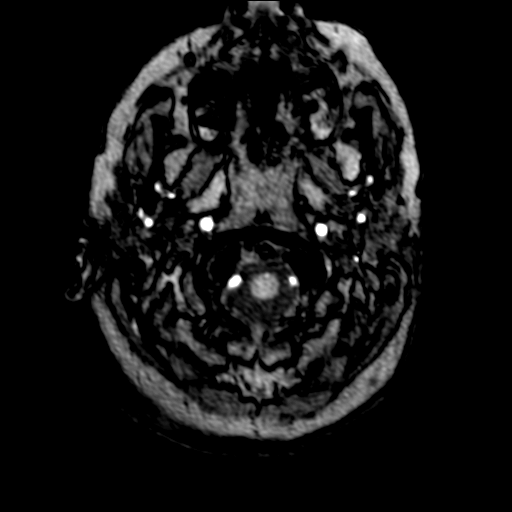
[im 18/205]
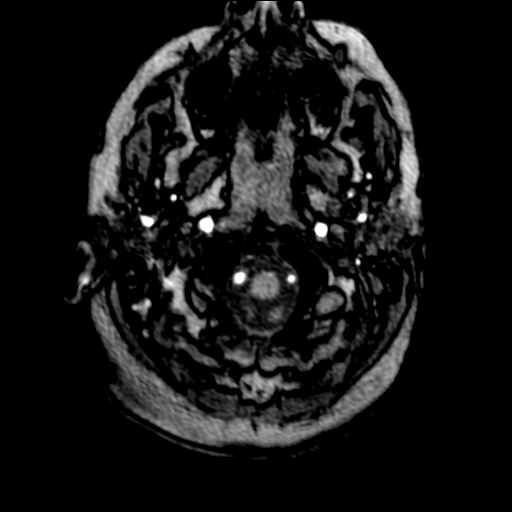
[im 22/205]
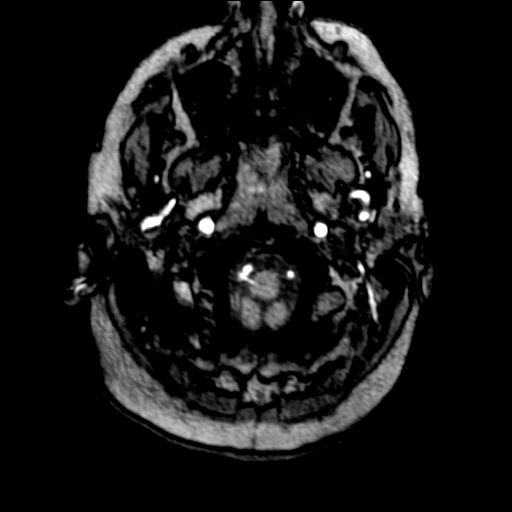
[im 27/205]
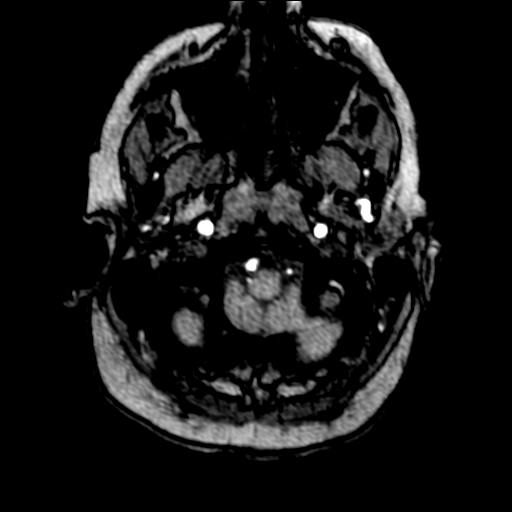
[im 31/205]
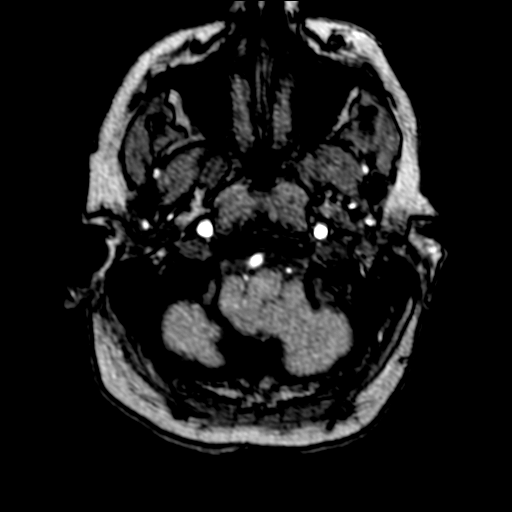
[im 35/205]
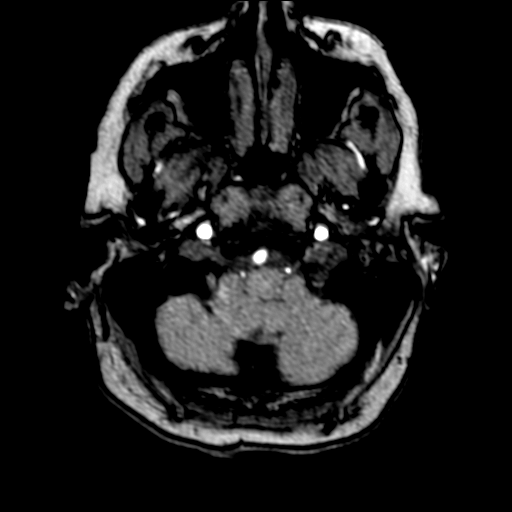
[im 40/205]
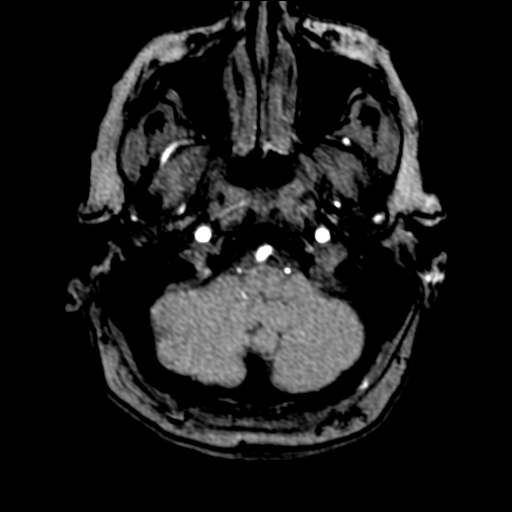
[im 44/205]
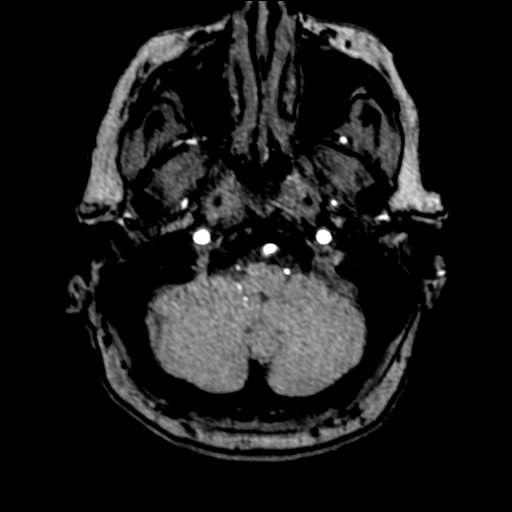
[im 66/205]
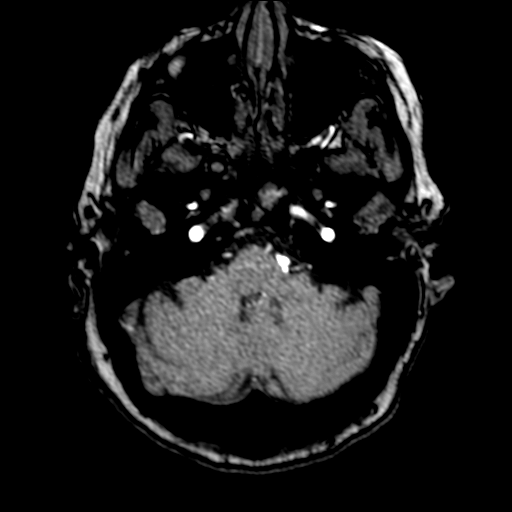
[im 92/205]
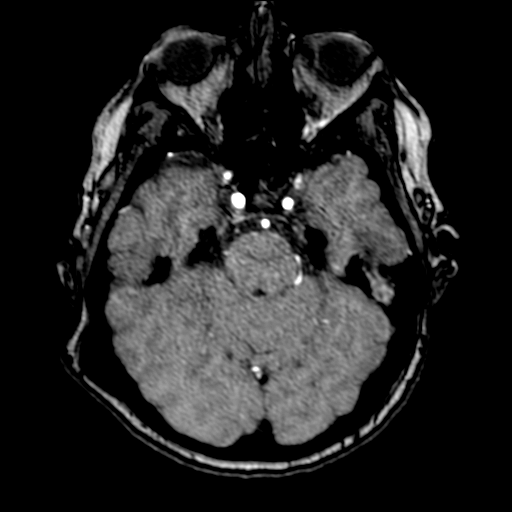
[im 105/205]
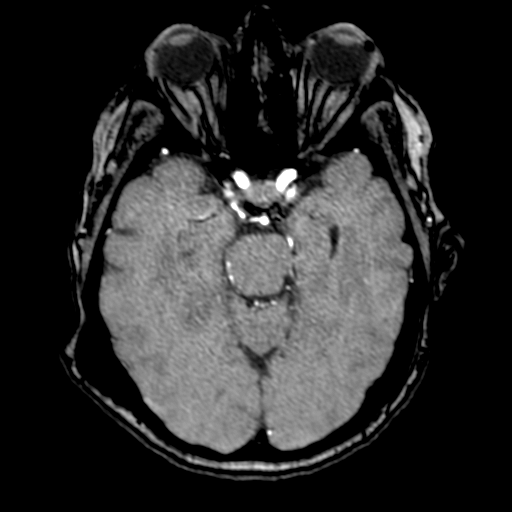
[im 118/205]
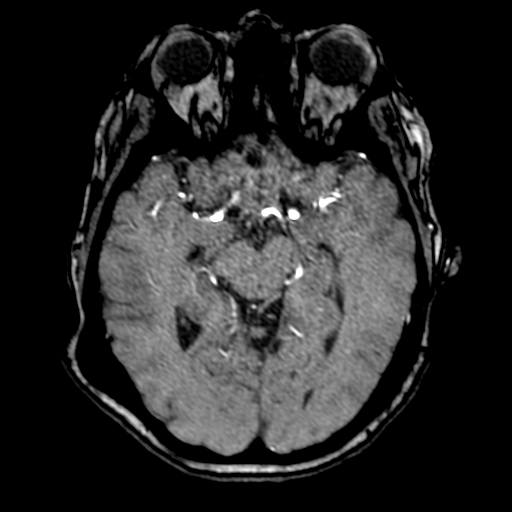
[im 144/205]
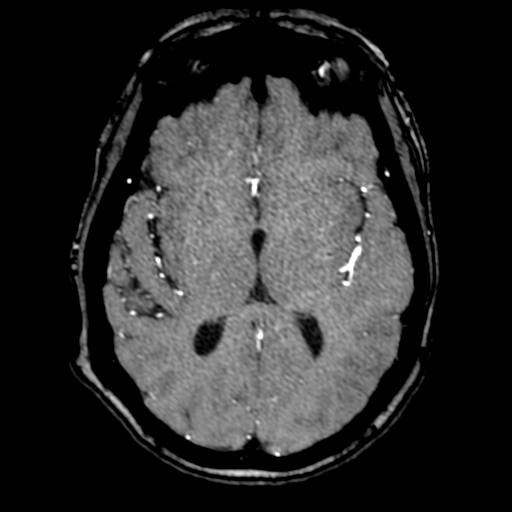
[im 170/205]
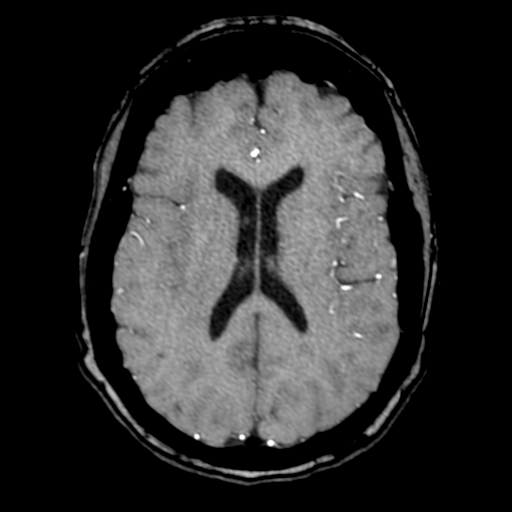
[im 174/205]
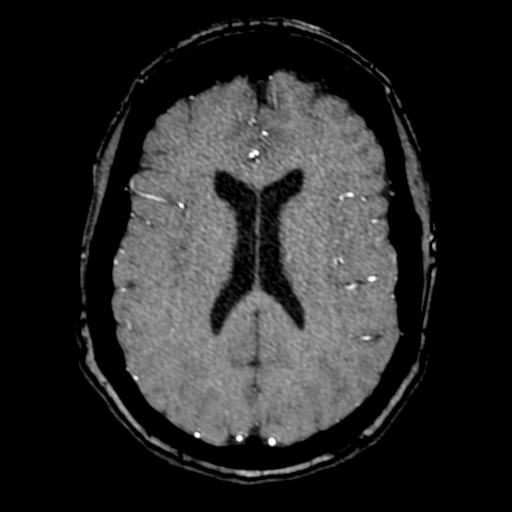
[im 196/205]
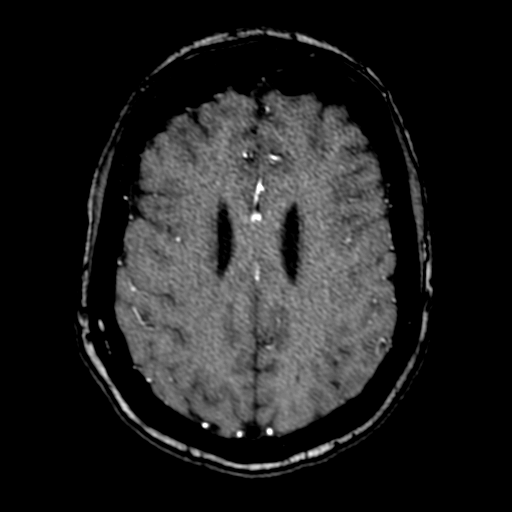

[19 of 48 positions shown; findings below may reference images not displayed]

FINDINGS: Anterior circulation: Both internal carotid arteries are patent to
the termini, without stenosis or other abnormality.

A1 segments patent. Normal anterior communicating artery. Anterior
cerebral arteries are patent to their distal aspects.

No M1 stenosis or occlusion. Normal MCA bifurcations. Distal MCA
branches perfused and symmetric.

Posterior circulation: Right dominant. Diminutive left V4 segment.
Vertebral arteries patent to the vertebrobasilar junction without
stenosis. Apparent discontinuity in the proximal V4 segments is
favored to be secondary to motion artifact. Posterior inferior
cerebral arteries patent bilaterally.

Basilar patent to its distal aspect. Superior cerebellar arteries
patent bilaterally.

Bilateral P1 segments originate from the basilar artery. PCAs
perfused to their distal aspects without stenosis. Bilateral
posterior communicating arteries are visualized.

Anatomic variants: None significant

Other: None.
IMPRESSION: No intracranial large vessel occlusion or significant stenosis.

## 2022-01-23 IMAGING — DX DG CHEST 1V PORT
1 series · 1 of 1 positions shown · non-contrast
Comparison: [DATE]

CLINICAL DATA: Hypoxia, intubated

EXAM:
PORTABLE CHEST 1 VIEW

[chest ap]
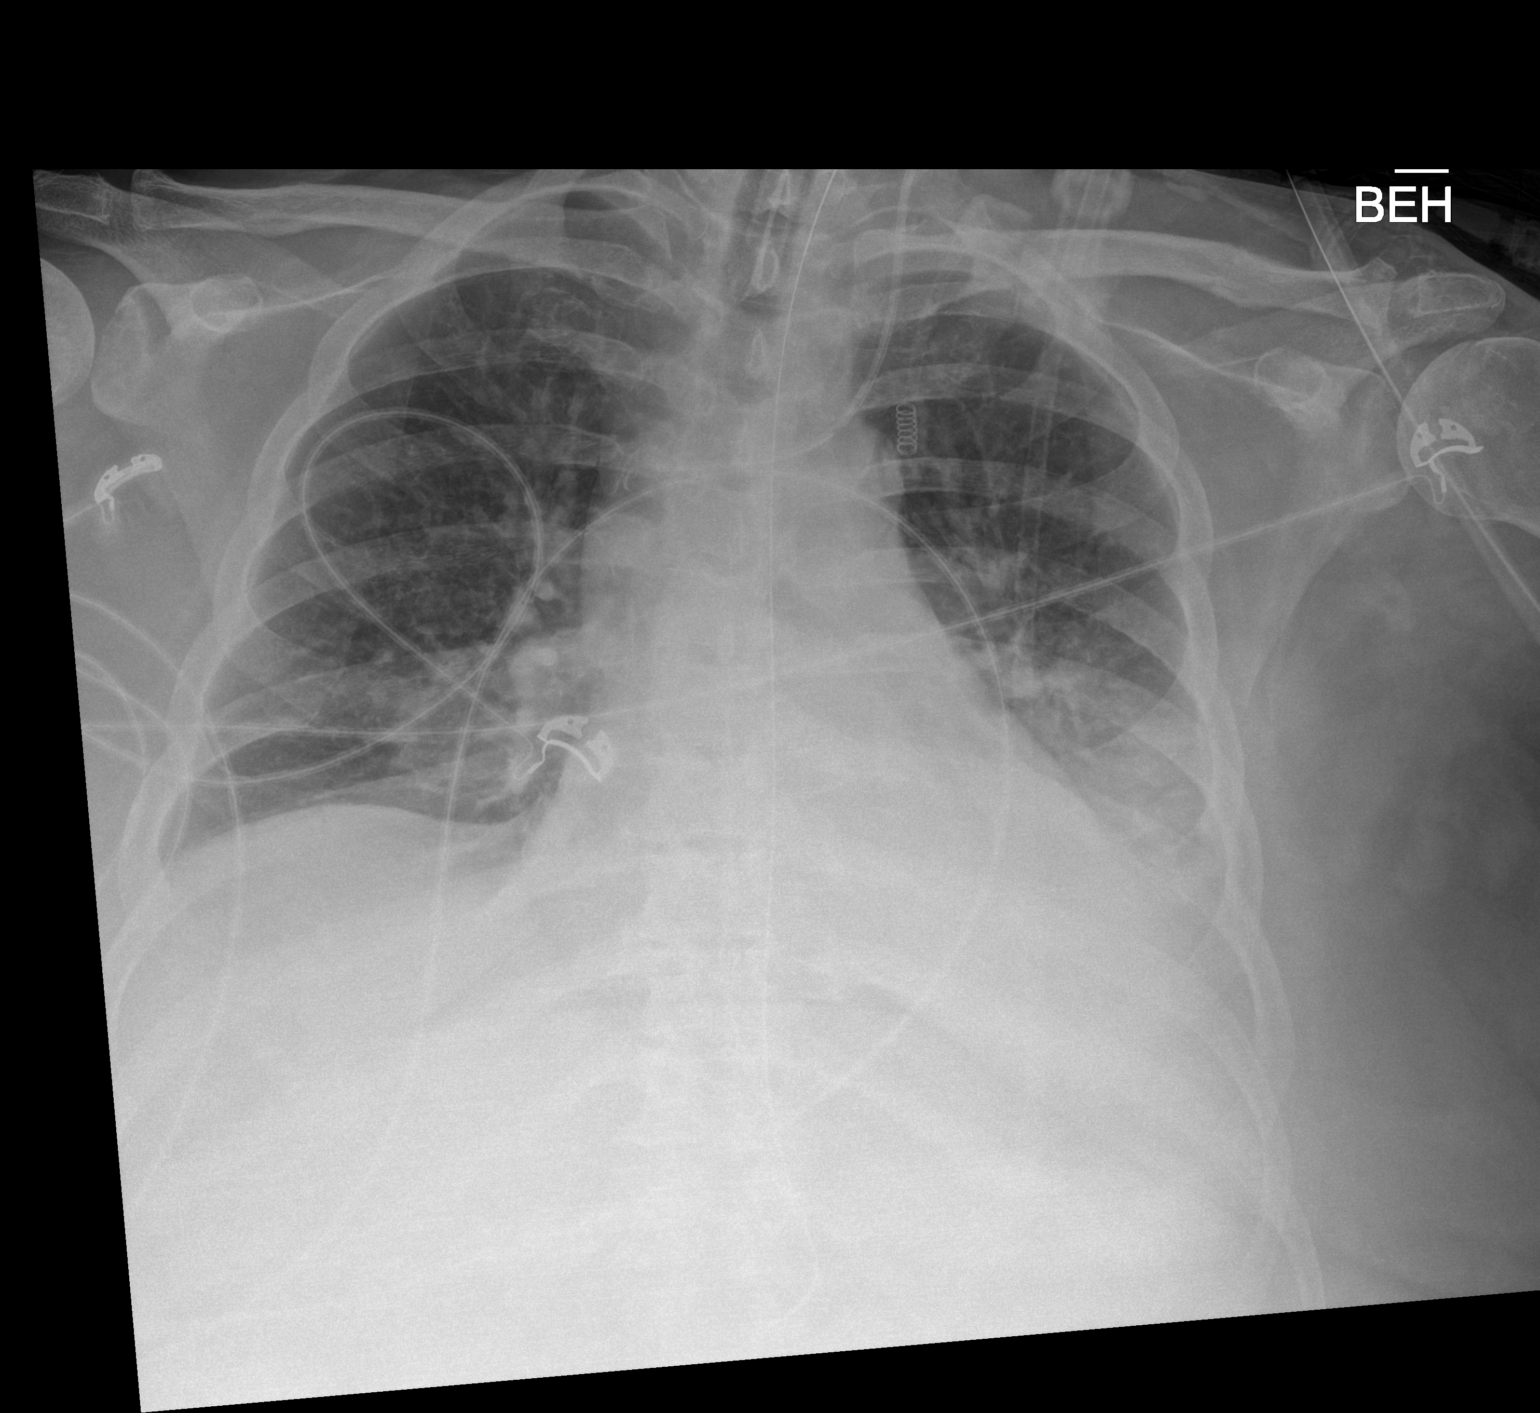

[1 of 1 positions shown; findings below may reference images not displayed]

FINDINGS: Endotracheal tube at the thoracic inlet, 8 cm above the carina.

Enteric tube courses into the mid stomach.

Cardiomegaly with pulmonary vascular congestion. No frank
interstitial edema. Small left pleural effusion. No pneumothorax.

Left IJ venous catheter terminates in the proximal SVC.
IMPRESSION: Endotracheal tube at the thoracic inlet, 8 cm above the carina.
Additional support apparatus as above.

Pulmonary vascular congestion without frank interstitial edema.
Small left pleural effusion.

## 2022-01-23 MED ORDER — VASOPRESSIN 20 UNITS/100 ML INFUSION FOR SHOCK
0.0000 [IU]/min | INTRAVENOUS | Status: DC
  Administered 2022-01-23 (×2): 0.03 [IU]/min via INTRAVENOUS
  Filled 2022-01-23 (×2): qty 100

## 2022-01-23 MED ORDER — PROSOURCE TF PO LIQD
90.0000 mL | Freq: Three times a day (TID) | ORAL | Status: DC
  Administered 2022-01-23 – 2022-01-27 (×13): 90 mL
  Filled 2022-01-23 (×14): qty 90

## 2022-01-23 MED ORDER — MIDAZOLAM HCL 2 MG/2ML IJ SOLN
2.0000 mg | Freq: Once | INTRAMUSCULAR | Status: AC
  Administered 2022-01-23: 2 mg via INTRAVENOUS

## 2022-01-23 MED ORDER — VECURONIUM BROMIDE 10 MG IV SOLR
10.0000 mg | Freq: Once | INTRAVENOUS | Status: AC
  Administered 2022-01-23: 10 mg via INTRAVENOUS

## 2022-01-23 MED ORDER — INSULIN GLARGINE-YFGN 100 UNIT/ML ~~LOC~~ SOLN
8.0000 [IU] | Freq: Two times a day (BID) | SUBCUTANEOUS | Status: DC
  Administered 2022-01-23 – 2022-01-27 (×9): 8 [IU] via SUBCUTANEOUS
  Filled 2022-01-23 (×10): qty 0.08

## 2022-01-23 MED ORDER — SODIUM CHLORIDE 0.9% FLUSH
10.0000 mL | Freq: Two times a day (BID) | INTRAVENOUS | Status: DC
  Administered 2022-01-23: 10 mL
  Administered 2022-01-24: 30 mL
  Administered 2022-01-24: 40 mL
  Administered 2022-01-25 – 2022-01-26 (×2): 10 mL
  Administered 2022-01-26: 30 mL
  Administered 2022-01-26 – 2022-01-27 (×3): 10 mL

## 2022-01-23 MED ORDER — SODIUM BICARBONATE 8.4 % IV SOLN
INTRAVENOUS | Status: AC
  Administered 2022-01-23: 100 meq via INTRAVENOUS
  Filled 2022-01-23: qty 100

## 2022-01-23 MED ORDER — SODIUM CHLORIDE 0.9 % IV SOLN: INTRAVENOUS | Status: DC | PRN

## 2022-01-23 MED ORDER — FENTANYL 2500MCG IN NS 250ML (10MCG/ML) PREMIX INFUSION
0.0000 ug/h | INTRAVENOUS | Status: DC
  Administered 2022-01-23 – 2022-01-24 (×2): 75 ug/h via INTRAVENOUS
  Filled 2022-01-23 (×2): qty 250

## 2022-01-23 MED ORDER — MIDAZOLAM HCL 2 MG/2ML IJ SOLN
INTRAMUSCULAR | Status: AC
  Filled 2022-01-23: qty 2

## 2022-01-23 MED ORDER — SODIUM BICARBONATE 8.4 % IV SOLN: 100.0000 meq | Freq: Once | INTRAVENOUS | Status: AC

## 2022-01-23 MED ORDER — VECURONIUM BROMIDE 10 MG IV SOLR
INTRAVENOUS | Status: AC
  Filled 2022-01-23: qty 10

## 2022-01-23 MED ORDER — STERILE WATER FOR INJECTION IJ SOLN
INTRAMUSCULAR | Status: AC
  Administered 2022-01-23: 10 mL
  Filled 2022-01-23: qty 10

## 2022-01-23 MED ORDER — VITAL AF 1.2 CAL PO LIQD
1000.0000 mL | ORAL | Status: DC
  Administered 2022-01-24 – 2022-01-25 (×3): 1000 mL

## 2022-01-23 MED ORDER — INSULIN ASPART 100 UNIT/ML IJ SOLN
0.0000 [IU] | INTRAMUSCULAR | Status: DC
  Administered 2022-01-23: 11 [IU] via SUBCUTANEOUS
  Administered 2022-01-24 (×2): 7 [IU] via SUBCUTANEOUS
  Administered 2022-01-24 (×2): 11 [IU] via SUBCUTANEOUS
  Administered 2022-01-24: 4 [IU] via SUBCUTANEOUS
  Administered 2022-01-24: 11 [IU] via SUBCUTANEOUS
  Administered 2022-01-25: 15 [IU] via SUBCUTANEOUS
  Administered 2022-01-25 (×3): 11 [IU] via SUBCUTANEOUS
  Administered 2022-01-25: 7 [IU] via SUBCUTANEOUS
  Administered 2022-01-25 – 2022-01-26 (×3): 15 [IU] via SUBCUTANEOUS
  Administered 2022-01-26 (×2): 11 [IU] via SUBCUTANEOUS
  Administered 2022-01-26: 20 [IU] via SUBCUTANEOUS
  Administered 2022-01-26: 15 [IU] via SUBCUTANEOUS
  Administered 2022-01-26: 11 [IU] via SUBCUTANEOUS
  Administered 2022-01-27: 7 [IU] via SUBCUTANEOUS
  Administered 2022-01-27 (×2): 11 [IU] via SUBCUTANEOUS
  Administered 2022-01-27: 7 [IU] via SUBCUTANEOUS
  Administered 2022-01-27: 11 [IU] via SUBCUTANEOUS
  Filled 2022-01-23 (×23): qty 1

## 2022-01-23 MED ORDER — INSULIN ASPART 100 UNIT/ML IJ SOLN
INTRAMUSCULAR | Status: AC
  Filled 2022-01-23: qty 1

## 2022-01-23 MED ORDER — SODIUM CHLORIDE 0.9% FLUSH: 10.0000 mL | INTRAVENOUS | Status: DC | PRN

## 2022-01-23 MED ORDER — HYDROCORTISONE SOD SUC (PF) 100 MG IJ SOLR
100.0000 mg | Freq: Three times a day (TID) | INTRAMUSCULAR | Status: DC
  Administered 2022-01-23 – 2022-01-25 (×5): 100 mg via INTRAVENOUS
  Filled 2022-01-23 (×5): qty 2

## 2022-01-23 NOTE — Progress Notes (Signed)
Uneventful day. Patient remains in SR/ST, ventilated and sedated. Husband in and spoke with Pallative care concerning goals of care. No SBT or wake up done due to high oxygen demands. (ventilator FIO2 at 70%) Receiving dialysis but requires Levophed at 10 mcg to maintain SBP greater than 100. Bruises noted all over body that where present on admission. Husband states she fell all the time at home.

## 2022-01-23 NOTE — Progress Notes (Signed)
Inpatient Diabetes Program Recommendations  AACE/ADA: New Consensus Statement on Inpatient Glycemic Control (2015)  Target Ranges:  Prepandial:   less than 140 mg/dL      Peak postprandial:   less than 180 mg/dL (1-2 hours)      Critically ill patients:  140 - 180 mg/dL    Latest Reference Range & Units 01/22/22 23:32 01/23/22 03:48 01/23/22 07:27  Glucose-Capillary 70 - 99 mg/dL 221 (H) 205 (H) 263 (H)  (H): Data is abnormally high    Home DM Meds: Humalog 14 units TID with meals (PCP visit 07/29/2021)        Lantus 80 units Daily (PCP visit 07/29/2021)        Ozempic   Current Orders: Novolog Moderate Correction Scale/ SSI (0-15 units) Q4 hours     MD- Note patient supposed to be taking Lantus and Humalog insulins at home.  Currently on Vent getting Tube Feeds at 20cc/hr.  Please consider initiating Basal insulin on a weight based approach:  Semglee 8 units BID (0.15 units/kg split dose)    --Will follow patient during hospitalization--  Wyn Quaker RN, MSN, CDE Diabetes Coordinator Inpatient Glycemic Control Team Team Pager: [phone] (8a-5p)

## 2022-01-23 NOTE — Plan of Care (Signed)
Patient remains intubated, sedated, on pressors. Plan for MRA tonight.   Problem: Education: Goal: Knowledge of General Education information will improve Description: Including pain rating scale, medication(s)/side effects and non-pharmacologic comfort measures Outcome: Not Progressing   Problem: Health Behavior/Discharge Planning: Goal: Ability to manage health-related needs will improve Outcome: Not Progressing   Problem: Clinical Measurements: Goal: Ability to maintain clinical measurements within normal limits will improve Outcome: Not Progressing Goal: Will remain free from infection Outcome: Not Progressing Goal: Diagnostic test results will improve Outcome: Not Progressing Goal: Respiratory complications will improve Outcome: Not Progressing Goal: Cardiovascular complication will be avoided Outcome: Not Progressing   Problem: Activity: Goal: Risk for activity intolerance will decrease Outcome: Not Progressing   Problem: Nutrition: Goal: Adequate nutrition will be maintained Outcome: Not Progressing   Problem: Coping: Goal: Level of anxiety will decrease Outcome: Not Progressing   Problem: Elimination: Goal: Will not experience complications related to bowel motility Outcome: Not Progressing Goal: Will not experience complications related to urinary retention Outcome: Not Progressing   Problem: Pain Managment: Goal: General experience of comfort will improve Outcome: Not Progressing   Problem: Safety: Goal: Ability to remain free from injury will improve Outcome: Not Progressing   Problem: Skin Integrity: Goal: Risk for impaired skin integrity will decrease Outcome: Not Progressing

## 2022-01-23 NOTE — Consult Note (Signed)
Consultation Note Date: 01/23/2022   Patient Name: Regina Jackson  DOB: November 24, 1956  MRN: [id number]  Age / Sex: 66 y.o., female  PCP: System, Provider Not In Referring Physician: Flora Lipps, MD  Reason for Consultation: Establishing goals of care  HPI/Patient Profile: 66 y.o. female  with past medical history of ESRD on home hemodialysis 3x weekly (still produces urine), hypothyroidism (s/p radioactive iodine thyroid ablation), anemia related to chronic renal disease, endometrial cancer (s/p surgery and chemo 2021 and no signs of recurrence), diabetes, hypertension admitted on 01/16/2022 with altered mental status and agitation with noted non-adherence to dialysis for 8 days per family (unclear if she was taking medication). Patient is notably under IVC. 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   Clinical Assessment and Goals of Care: I met today at Emri's bedside after reviewing records and discussing with Dr. Mortimer Fries, Dr. Quinn Axe, and RN Myra. Shley has very poor neurological assessment and sedation has just been turned off. I reached out to husband, [name], who is en-route to the hospital currently.   I met today with [name]. [name] and Nayana have been married 15 years and have one son and one daughter. They have been in the process of relocating from [place] and much of Wyatt's family is in this area. She had not yet transitioned all of her care and providers into this area. Cecille Rubin shares that he has been concerned for Cristal as she does not use CPAP and has sleep apnea - he reports that she often falls asleep and falls out of her chair into the floor. She is supposed to do dialysis 4x weekly but often refuses and never completes 4x weekly as prescribed. He does report that she is meticulous about her medications but with refusing dialysis her confusion increased and she  was unlikely to be taking her medications in the week or so prior to admission. [name] does report that Kyira was able to drive and do most anything she wanted prior to this acute illness.   [name] and I discuss Sidney's complicated health circumstances currently with multiple complicated issues and very concerning neurological status. [name] asks many good questions (he is a Marine scientist) and expresses great hope. I spent much time bringing [name] back to current situation today as he had many questions and concerns about the importance of rehab and if he will be able to take [place] back home and even about changing to outpatient dialysis center for her dialysis care. I gently explained that Mirza continues to be critically ill and we are not out of the woods and a long ways away from having to consider these issues. I attempted to speak with [name] about code status and Dorothee's wishes. [name] continues to be very hopeful for improvement and not at a place to consider an alternative at this time.   All questions/concerns addressed to the best of my ability. Emotional support provided.   Primary Decision Maker NEXT OF KIN husband [name]  SUMMARY OF RECOMMENDATIONS   - Ongoing palliative discussions and support  Code Status/Advance Care Planning: Full code   Symptom Management:  Per PCCM, neurology, nephrology.   Palliative Prophylaxis:  Aspiration, Delirium Protocol, Frequent Pain Assessment, Oral Care, and Turn Reposition  Additional Recommendations (Limitations, Scope, Preferences): Full Comfort Care  Prognosis:  Prognosis guarded with severe multiorgan failure.   Discharge Planning: To Be Determined      Primary Diagnoses: Present on Admission:  Hypothyroidism   I have reviewed the medical record, interviewed the patient and family, and examined the patient. The following aspects are pertinent.  Past Medical History:  Diagnosis Date   Anemia of chronic disease    Diabetes mellitus without  complication ([place])    endometrial cancer    ESRD on HD    Hypertension    Hypothyroidism    Sleep apnea    Social History   Socioeconomic History   Marital status: Married    Spouse name: Not on file   Number of children: Not on file   Years of education: Not on file   Highest education level: Not on file  Occupational History   Not on file  Tobacco Use   Smoking status: Never    Passive exposure: Never   Smokeless tobacco: Never  Vaping Use   Vaping Use: Never used  Substance and Sexual Activity   Alcohol use: Never   Drug use: Never   Sexual activity: Yes  Other Topics Concern   Not on file  Social History Narrative   Not on file   Social Determinants of Health   Financial Resource Strain: Not on file  Food Insecurity: Not on file  Transportation Needs: Not on file  Physical Activity: Not on file  Stress: Not on file  Social Connections: Not on file   History reviewed. No pertinent family history. Scheduled Meds:  albuterol  2.5 mg Nebulization Q6H   aspirin  81 mg Per Tube Daily   chlorhexidine gluconate (MEDLINE KIT)  15 mL Mouth Rinse BID   Chlorhexidine Gluconate Cloth  6 each Topical Q0600   docusate  100 mg Per Tube BID   epoetin (EPOGEN/PROCRIT) injection  4,000 Units Intravenous Q M,W,F-HD   feeding supplement (PROSource TF)  90 mL Per Tube QID   feeding supplement (VITAL HIGH PROTEIN)  1,000 mL Per Tube Q24H   free water  30 mL Per Tube Q4H   heparin  5,000 Units Subcutaneous Q8H   insulin aspart  0-15 Units Subcutaneous Q4H   levothyroxine  100 mcg Intravenous Daily   mouth rinse  15 mL Mouth Rinse 10 times per day   multivitamin  1 tablet Per Tube QHS   multivitamin  15 mL Per Tube Daily   pantoprazole (PROTONIX) IV  40 mg Intravenous Daily   polyethylene glycol  17 g Per Tube Daily   risperiDONE  0.5 mg Per Tube TID   rOPINIRole  0.25 mg Per Tube QHS   Continuous Infusions:  sodium chloride Stopped (01/20/22 1400)   fentaNYL infusion  INTRAVENOUS 75 mcg/hr (01/23/22 1136)   norepinephrine (LEVOPHED) Adult infusion 5 mcg/min (01/23/22 1136)   propofol (DIPRIVAN) infusion Stopped (01/23/22 1145)   thiamine injection 500 mg (01/23/22 0942)   vancomycin     vasopressin 0.03 Units/min (01/23/22 0623)   PRN Meds:.acetaminophen **OR** acetaminophen, alteplase, fentaNYL (SUBLIMAZE) injection, fentaNYL (SUBLIMAZE) injection, heparin, lidocaine (PF), lidocaine-prilocaine, ondansetron **OR** ondansetron (ZOFRAN) IV, pentafluoroprop-tetrafluoroeth, vancomycin Allergies  Allergen Reactions   Amoxicillin  Augmentin [Amoxicillin-Pot Clavulanate]    Ceftin [Cefuroxime]    Compazine [Prochlorperazine]    Demerol [Meperidine Hcl]    Macrobid [Nitrofurantoin]    Phenergan [Promethazine]    Sulfa Antibiotics    Review of Systems  Unable to perform ROS: Acuity of condition   Physical Exam Vitals and nursing note reviewed.  Constitutional:      General: She is not in acute distress.    Appearance: She is ill-appearing.     Interventions: She is intubated.  Cardiovascular:     Rate and Rhythm: Normal rate.  Pulmonary:     Effort: No tachypnea or accessory muscle usage. She is intubated.  Abdominal:     Palpations: Abdomen is soft.  Skin:    Coloration: Skin is pale.  Neurological:     Mental Status: She is unresponsive.    Vital Signs: BP (!) 105/44    Pulse 93    Temp 99 F (37.2 C) (Oral)    Resp 19    Ht 5' 7.52" (1.715 m)    Wt 114.4 kg    SpO2 97%    BMI 38.89 kg/m  Pain Scale: CPOT   Pain Score: 0-No pain   SpO2: SpO2: 97 % O2 Device:SpO2: 97 % O2 Flow Rate: .   IO: Intake/output summary:  Intake/Output Summary (Last 24 hours) at 01/23/2022 1449 Last data filed at 01/23/2022 1200 Gross per 24 hour  Intake 1163.06 ml  Output 50 ml  Net 1113.06 ml    LBM: Last BM Date:  (UNKNOWN) Baseline Weight: Weight: 108.1 kg Most recent weight: Weight: 114.4 kg     Palliative Assessment/Data:     Time Total:  80 min  Greater than 50%  of this time was spent counseling and coordinating care related to the above assessment and plan.  Signed by: Vinie Sill, NP Palliative Medicine Team Pager # [phone] (M-F 8a-5p) Team Phone # [phone] (Nights/Weekends)

## 2022-01-23 NOTE — Progress Notes (Signed)
Patient completed dialysis treatment as ordered. 1.5 liters of fluids removed over 3 hours. No adverse effects noted. Blood pressure stable with help of pressure support medication. Patient received Vancomycin and Epogen ordered during treatment. Report given to floor nurse Lonie Peak. White, Therapist, sports.

## 2022-01-23 NOTE — Progress Notes (Incomplete)
Status quo day. Pallative care talked with Regina Jackson today. Patient not awakened due to high FIO2 demands. Ventilator FIO@70 %. Dialysis

## 2022-01-23 NOTE — Progress Notes (Signed)
Subjective: Exam remains poor, even when sedation paused x30 min. Palliative care consulted today. Underwent dialysis this afternoon. Vascular imaging deferred 2/2 patient instability. On levophed.   Objective: Current vital signs: BP (!) 119/46    Pulse 99    Temp 99.2 F (37.3 C)    Resp (!) 22    Ht 5' 7.52" (1.715 m)    Wt 121 kg    SpO2 95%    BMI 41.14 kg/m  Vital signs in last 24 hours: Temp:  [99 F (37.2 C)-100.2 F (37.9 C)] 99.2 F (37.3 C) (01/30 1606) Pulse Rate:  [92-117] 99 (01/30 1900) Resp:  [15-27] 22 (01/30 1900) BP: (83-137)/(38-63) 119/46 (01/30 1900) SpO2:  [89 %-99 %] 95 % (01/30 1900) FiO2 (%):  [40 %-70 %] 60 % (01/30 1845) Weight:  [127 kg] 121 kg (01/30 1531)  Intake/Output from previous day: 01/29 0701 - 01/30 0700 In: 1403.1 [I.V.:413.1; NG/GT:940; IV Piggyback:50] Out: 85 [Urine:85] Intake/Output this shift: No intake/output data recorded. Nutritional status:  Diet Order             Diet NPO time specified  Diet effective now                    Physical Exam  HEENT-  /AT. Neck is supple.   Lungs- Intubated and sedated    Extremities- Warm and well perfused    Neurological Examination Note: fentanyl paused x30 min prior to exam Mental Status: Does not respond to loud clapping or calling of her name in the sedated state. No purposeful movements. No posturing to noxious except for internal rotation of LLE and thigh and ankle to noxious and slight withdrawal of LUE by about 2 cm in response to noxious applied to either arm. No eye opening. Cranial Nerves: II: Pupils almost pinpoint, sluggishly reactive. No blink to threat.  III,IV, VI: Eyes exotropic near the midline without doll's eye reflex on sedation. V,VII: Weak corneals bilaterally   VIII: No response to voice IX,X: Intubated. Cough and gag intact.  XI: Head is midline.  XII: Intubated Motor/Sensory: Flaccid tone x 4 at rest.  No purposeful movements. No posturing to noxious  except for internal rotation of LLE and thigh and ankle to noxious and slight withdrawal of LUE by about 2 cm in response to noxious applied to either arm.  Deep Tendon Reflexes: Hypoactive in the context of sedation. Toes mute on the right and upgoing on the left  Cerebellar/Gait: Unable to assess  Lab Results: Results for orders placed or performed during the hospital encounter of 12/28/2021 (from the past 48 hour(s))  Glucose, capillary     Status: Abnormal   Collection Time: 01/21/22  8:20 PM  Result Value Ref Range   Glucose-Capillary 149 (H) 70 - 99 mg/dL    Comment: Glucose reference range applies only to samples taken after fasting for at least 8 hours.  Glucose, capillary     Status: Abnormal   Collection Time: 01/21/22 11:40 PM  Result Value Ref Range   Glucose-Capillary 183 (H) 70 - 99 mg/dL    Comment: Glucose reference range applies only to samples taken after fasting for at least 8 hours.  CBC     Status: Abnormal   Collection Time: 01/22/22  3:40 AM  Result Value Ref Range   WBC 17.8 (H) 4.0 - 10.5 K/uL   RBC 2.81 (L) 3.87 - 5.11 MIL/uL   Hemoglobin 8.3 (L) 12.0 - 15.0 g/dL   HCT 27.1 (  L) 36.0 - 46.0 %   MCV 96.4 80.0 - 100.0 fL   MCH 29.5 26.0 - 34.0 pg   MCHC 30.6 30.0 - 36.0 g/dL   RDW 14.2 11.5 - 15.5 %   Platelets 170 150 - 400 K/uL   nRBC 0.0 0.0 - 0.2 %    Comment: Performed at [facility], [address] [place], [place] [address]  Renal function panel     Status: Abnormal   Collection Time: 01/22/22  3:40 AM  Result Value Ref Range   Sodium 132 (L) 135 - 145 mmol/L   Potassium 3.6 3.5 - 5.1 mmol/L   Chloride 101 98 - 111 mmol/L   CO2 20 (L) 22 - 32 mmol/L   Glucose, Bld 230 (H) 70 - 99 mg/dL    Comment: Glucose reference range applies only to samples taken after fasting for at least 8 hours.   BUN 58 (H) 8 - 23 mg/dL   Creatinine, Ser 6.22 (H) 0.44 - 1.00 mg/dL   Calcium 8.8 (L) 8.9 - 10.3 mg/dL   Phosphorus 6.3 (H) 2.5 - 4.6 mg/dL    Albumin 2.6 (L) 3.5 - 5.0 g/dL   GFR, Estimated 7 (L) >60 mL/min    Comment: (NOTE) Calculated using the CKD-EPI Creatinine Equation (2021)    Anion gap 11 5 - 15    Comment: Performed at [facility], [address] [place], [place] [address]  Procalcitonin     Status: None   Collection Time: 01/22/22  3:40 AM  Result Value Ref Range   Procalcitonin 1.86 ng/mL    Comment:        Interpretation: PCT > 0.5 ng/mL and <= 2 ng/mL: Systemic infection (sepsis) is possible, but other conditions are known to elevate PCT as well. (NOTE)       Sepsis PCT Algorithm           Lower Respiratory Tract                                      Infection PCT Algorithm    ----------------------------     ----------------------------         PCT < 0.25 ng/mL                PCT < 0.10 ng/mL          Strongly encourage             Strongly discourage   discontinuation of antibiotics    initiation of antibiotics    ----------------------------     -----------------------------       PCT 0.25 - 0.50 ng/mL            PCT 0.10 - 0.25 ng/mL               OR       >80% decrease in PCT            Discourage initiation of                                            antibiotics      Encourage discontinuation           of antibiotics    ----------------------------     -----------------------------  PCT >= 0.50 ng/mL              PCT 0.26 - 0.50 ng/mL                AND       <80% decrease in PCT             Encourage initiation of                                             antibiotics       Encourage continuation           of antibiotics    ----------------------------     -----------------------------        PCT >= 0.50 ng/mL                  PCT > 0.50 ng/mL               AND         increase in PCT                  Strongly encourage                                      initiation of antibiotics    Strongly encourage escalation           of antibiotics                                      -----------------------------                                           PCT <= 0.25 ng/mL                                                 OR                                        > 80% decrease in PCT                                      Discontinue / Do not initiate                                             antibiotics  Performed at [facility], [address] [place], [place] [address]   Magnesium     Status: None   Collection Time: 01/22/22  3:40 AM  Result Value Ref Range   Magnesium 1.8 1.7 - 2.4 mg/dL    Comment: Performed at [facility], [address] [place], [place] [address]  Glucose, capillary  Status: Abnormal   Collection Time: 01/22/22  3:48 AM  Result Value Ref Range   Glucose-Capillary 201 (H) 70 - 99 mg/dL    Comment: Glucose reference range applies only to samples taken after fasting for at least 8 hours.  Glucose, capillary     Status: Abnormal   Collection Time: 01/22/22  7:35 AM  Result Value Ref Range   Glucose-Capillary 218 (H) 70 - 99 mg/dL    Comment: Glucose reference range applies only to samples taken after fasting for at least 8 hours.  Glucose, capillary     Status: Abnormal   Collection Time: 01/22/22  7:30 PM  Result Value Ref Range   Glucose-Capillary 216 (H) 70 - 99 mg/dL    Comment: Glucose reference range applies only to samples taken after fasting for at least 8 hours.   Comment 1 Notify RN    Comment 2 Document in Chart   Glucose, capillary     Status: Abnormal   Collection Time: 01/22/22 11:32 PM  Result Value Ref Range   Glucose-Capillary 221 (H) 70 - 99 mg/dL    Comment: Glucose reference range applies only to samples taken after fasting for at least 8 hours.   Comment 1 Notify RN    Comment 2 Document in Chart   Blood gas, arterial     Status: Abnormal   Collection Time: 01/23/22  3:18 AM  Result Value Ref Range   FIO2 60.00    Mode PRESSURE REGULATED VOLUME CONTROL    VT 420 mL   Peep/cpap 8.0  cm H20   pH, Arterial 7.18 (LL) 7.350 - 7.450    Comment: CRITICAL RESULT, NOTIFIED PHYSICIAN RUST[place]STER [id number] DT    pCO2 arterial 43 32.0 - 48.0 mmHg   pO2, Arterial 84 83.0 - 108.0 mmHg   Bicarbonate 16.0 (L) 20.0 - 28.0 mmol/L   Acid-base deficit 11.7 (H) 0.0 - 2.0 mmol/L   O2 Saturation 93.1 %   Patient temperature 37.0    Collection site RIGHT RADIAL    Sample type ARTERIAL DRAW    Allens test (pass/fail) PASS PASS   Mechanical Rate 20     Comment: Performed at [facility], [address] [place], [place] [address]  Glucose, capillary     Status: Abnormal   Collection Time: 01/23/22  3:48 AM  Result Value Ref Range   Glucose-Capillary 205 (H) 70 - 99 mg/dL    Comment: Glucose reference range applies only to samples taken after fasting for at least 8 hours.   Comment 1 Notify RN    Comment 2 Document in Chart   CBC     Status: Abnormal   Collection Time: 01/23/22  4:07 AM  Result Value Ref Range   WBC 20.2 (H) 4.0 - 10.5 K/uL   RBC 2.83 (L) 3.87 - 5.11 MIL/uL   Hemoglobin 8.6 (L) 12.0 - 15.0 g/dL   HCT 27.6 (L) 36.0 - 46.0 %   MCV 97.5 80.0 - 100.0 fL   MCH 30.4 26.0 - 34.0 pg   MCHC 31.2 30.0 - 36.0 g/dL   RDW 14.1 11.5 - 15.5 %   Platelets 183 150 - 400 K/uL   nRBC 0.3 (H) 0.0 - 0.2 %    Comment: Performed at [facility], [address], [place], [place] [address]  Renal function panel     Status: Abnormal   Collection Time: 01/23/22  4:07 AM  Result Value Ref Range   Sodium 135 135 -  145 mmol/L   Potassium 3.7 3.5 - 5.1 mmol/L   Chloride 99 98 - 111 mmol/L   CO2 22 22 - 32 mmol/L   Glucose, Bld 235 (H) 70 - 99 mg/dL    Comment: Glucose reference range applies only to samples taken after fasting for at least 8 hours.   BUN 86 (H) 8 - 23 mg/dL   Creatinine, Ser 7.47 (H) 0.44 - 1.00 mg/dL   Calcium 8.8 (L) 8.9 - 10.3 mg/dL   Phosphorus 7.1 (H) 2.5 - 4.6 mg/dL   Albumin 2.3 (L) 3.5 - 5.0 g/dL   GFR, Estimated 6 (L) >60 mL/min     Comment: (NOTE) Calculated using the CKD-EPI Creatinine Equation (2021)    Anion gap 14 5 - 15    Comment: Performed at [facility], [address] [place], [place] [address]  TSH     Status: Abnormal   Collection Time: 01/23/22  4:07 AM  Result Value Ref Range   TSH 9.843 (H) 0.350 - 4.500 uIU/mL    Comment: Performed by a 3rd Generation assay with a functional sensitivity of <=0.01 uIU/mL. Performed at [facility], [address] [place], [place] [address]   Magnesium     Status: None   Collection Time: 01/23/22  4:07 AM  Result Value Ref Range   Magnesium 1.7 1.7 - 2.4 mg/dL    Comment: Performed at [facility], [address] [place], [place] [address]  Glucose, capillary     Status: Abnormal   Collection Time: 01/23/22  7:27 AM  Result Value Ref Range   Glucose-Capillary 263 (H) 70 - 99 mg/dL    Comment: Glucose reference range applies only to samples taken after fasting for at least 8 hours.  Glucose, capillary     Status: Abnormal   Collection Time: 01/23/22 11:37 AM  Result Value Ref Range   Glucose-Capillary 273 (H) 70 - 99 mg/dL    Comment: Glucose reference range applies only to samples taken after fasting for at least 8 hours.  Glucose, capillary     Status: Abnormal   Collection Time: 01/23/22  4:22 PM  Result Value Ref Range   Glucose-Capillary 269 (H) 70 - 99 mg/dL    Comment: Glucose reference range applies only to samples taken after fasting for at least 8 hours.  Glucose, capillary     Status: Abnormal   Collection Time: 01/23/22  7:16 PM  Result Value Ref Range   Glucose-Capillary 228 (H) 70 - 99 mg/dL    Comment: Glucose reference range applies only to samples taken after fasting for at least 8 hours.   Comment 1 Notify RN    Comment 2 Document in Chart     Recent Results (from the past 240 hour(s))  Resp Panel by RT-PCR (Flu A&B, Covid) Nasopharyngeal Swab     Status: None   Collection Time: 01/04/2022  8:30 PM    Specimen: Nasopharyngeal Swab; Nasopharyngeal(NP) swabs in vial transport medium  Result Value Ref Range Status   SARS Coronavirus 2 by RT PCR NEGATIVE NEGATIVE Final    Comment: (NOTE) SARS-CoV-2 target nucleic acids are NOT DETECTED.  The SARS-CoV-2 RNA is generally detectable in upper respiratory specimens during the acute phase of infection. The lowest concentration of SARS-CoV-2 viral copies this assay can detect is 138 copies/mL. A negative result does not preclude SARS-Cov-2 infection and should not be used as the sole basis for treatment or other patient management decisions. A negative result may  occur with  improper specimen collection/handling, submission of specimen other than nasopharyngeal swab, presence of viral mutation(s) within the areas targeted by this assay, and inadequate number of viral copies(<138 copies/mL). A negative result must be combined with clinical observations, patient history, and epidemiological information. The expected result is Negative.  Fact Sheet for Patients:  EntrepreneurPulse.com.au  Fact Sheet for Healthcare Providers:  IncredibleEmployment.be  This test is no t yet approved or cleared by the [place] FDA and  has been authorized for detection and/or diagnosis of SARS-CoV-2 by FDA under an Emergency Use Authorization (EUA). This EUA will remain  in effect (meaning this test can be used) for the duration of the COVID-19 declaration under Section 564(b)(1) of the Act, 21 U.S.C.section 360bbb-3(b)(1), unless the authorization is terminated  or revoked sooner.       Influenza A by PCR NEGATIVE NEGATIVE Final   Influenza B by PCR NEGATIVE NEGATIVE Final    Comment: (NOTE) The Xpert Xpress SARS-CoV-2/FLU/RSV plus assay is intended as an aid in the diagnosis of influenza from Nasopharyngeal swab specimens and should not be used as a sole basis for treatment. Nasal washings and aspirates are  unacceptable for Xpert Xpress SARS-CoV-2/FLU/RSV testing.  Fact Sheet for Patients: EntrepreneurPulse.com.au  Fact Sheet for Healthcare Providers: IncredibleEmployment.be  This test is not yet approved or cleared by the [place] FDA and has been authorized for detection and/or diagnosis of SARS-CoV-2 by FDA under an Emergency Use Authorization (EUA). This EUA will remain in effect (meaning this test can be used) for the duration of the COVID-19 declaration under Section 564(b)(1) of the Act, 21 U.S.C. section 360bbb-3(b)(1), unless the authorization is terminated or revoked.  Performed at [facility], [address] [place], [place] [address]   MRSA Next Gen by PCR, Nasal     Status: None   Collection Time: 01/20/22  8:50 AM   Specimen: Nasal Mucosa; Nasal Swab  Result Value Ref Range Status   MRSA by PCR Next Gen NOT DETECTED NOT DETECTED Final    Comment: (NOTE) The GeneXpert MRSA Assay (FDA approved for NASAL specimens only), is one component of a comprehensive MRSA colonization surveillance program. It is not intended to diagnose MRSA infection nor to guide or monitor treatment for MRSA infections. Test performance is not FDA approved in patients less than 29 years old. Performed at [facility], [address] [place], [place] [address]   Culture, Respiratory w Gram Stain     Status: None   Collection Time: 01/21/22  2:46 AM   Specimen: Tracheal Aspirate; Respiratory  Result Value Ref Range Status   Specimen Description   Final    TRACHEAL ASPIRATE Performed at [facility], [address], [place], [place] [address]    Special Requests   Final    NONE Performed at [facility], [address] [place], [place] [address]    Gram Stain   Final    ABUNDANT WBC PRESENT, PREDOMINANTLY PMN ABUNDANT GRAM POSITIVE COCCI IN CLUSTERS Performed at [facility], [address]  [place], [place] [address]    Culture ABUNDANT STAPHYLOCOCCUS AUREUS  Final   Report Status 01/23/2022 FINAL  Final   Organism ID, Bacteria STAPHYLOCOCCUS AUREUS  Final      Susceptibility   Staphylococcus aureus - MIC*    CIPROFLOXACIN <=0.5 SENSITIVE Sensitive     ERYTHROMYCIN <=0.25 SENSITIVE Sensitive     GENTAMICIN <=0.5 SENSITIVE Sensitive     OXACILLIN <=0.25 SENSITIVE  Sensitive     TETRACYCLINE <=1 SENSITIVE Sensitive     VANCOMYCIN <=0.5 SENSITIVE Sensitive     TRIMETH/SULFA <=10 SENSITIVE Sensitive     CLINDAMYCIN <=0.25 SENSITIVE Sensitive     RIFAMPIN <=0.5 SENSITIVE Sensitive     Inducible Clindamycin NEGATIVE Sensitive     * ABUNDANT STAPHYLOCOCCUS AUREUS    Lipid Panel Recent Labs    01/21/22 0537  TRIG 251*     Studies/Results: DG Chest Port 1 View  Result Date: 01/23/2022 CLINICAL DATA:  Hypoxia, intubated EXAM: PORTABLE CHEST 1 VIEW COMPARISON:  01/21/2022 FINDINGS: Endotracheal tube at the thoracic inlet, 8 cm above the carina. Enteric tube courses into the mid stomach. Cardiomegaly with pulmonary vascular congestion. No frank interstitial edema. Small left pleural effusion. No pneumothorax. Left IJ venous catheter terminates in the proximal SVC. IMPRESSION: Endotracheal tube at the thoracic inlet, 8 cm above the carina. Additional support apparatus as above. Pulmonary vascular congestion without frank interstitial edema. Small left pleural effusion. Electronically Signed   By: Julian Hy M.D.   On: 01/23/2022 03:10   ECHOCARDIOGRAM COMPLETE  Result Date: 01/22/2022    ECHOCARDIOGRAM REPORT   Patient Name:   JANEA SCHWENN Date of Exam: 01/22/2022 Medical Rec #:  [id number]      Height:       67.5 in Accession #:    [id number]     Weight:       252.2 lb Date of Birth:  [birth year]      BSA:          2.244 m Patient Age:    66 years       BP:           122/61 mmHg Patient Gender: F              HR:           102 bpm. Exam Location:  [facility] Procedure: 2D Echo and  Intracardiac Opacification Agent Indications:     Stroke  History:         Patient has no prior history of Echocardiogram examinations.                  Risk Factors:ERSD.  Sonographer:     L Thornton-Maynard Referring Phys:  [id number] [place] L [place] Diagnosing Phys: Neoma Laming  Sonographer Comments: Echo performed with patient supine and on artificial respirator and patient is morbidly obese. Image acquisition challenging due to patient body habitus. IMPRESSIONS  1. Left ventricular ejection fraction, by estimation, is 60 to 65%. The left ventricle has normal function. The left ventricle has no regional wall motion abnormalities. Left ventricular diastolic parameters are consistent with Grade III diastolic dysfunction (restrictive).  2. Right ventricular systolic function is normal. The right ventricular size is normal.  3. Left atrial size was mild to moderately dilated.  4. Right atrial size was mild to moderately dilated.  5. The mitral valve is normal in structure. Trivial mitral valve regurgitation. No evidence of mitral stenosis.  6. The aortic valve is normal in structure. Aortic valve regurgitation is not visualized. Aortic valve sclerosis is present, with no evidence of aortic valve stenosis.  7. The inferior vena cava is normal in size with greater than 50% respiratory variability, suggesting right atrial pressure of 3 mmHg. FINDINGS  Left Ventricle: Left ventricular ejection fraction, by estimation, is 60 to 65%. The left ventricle has normal function. The left ventricle has no regional wall motion abnormalities. Definity contrast agent was given  IV to delineate the left ventricular  endocardial borders. The left ventricular internal cavity size was normal in size. There is no left ventricular hypertrophy. Left ventricular diastolic parameters are consistent with Grade III diastolic dysfunction (restrictive). Right Ventricle: The right ventricular size is normal. No increase in right ventricular  wall thickness. Right ventricular systolic function is normal. Left Atrium: Left atrial size was mild to moderately dilated. Right Atrium: Right atrial size was mild to moderately dilated. Pericardium: There is no evidence of pericardial effusion. Mitral Valve: The mitral valve is normal in structure. Trivial mitral valve regurgitation. No evidence of mitral valve stenosis. MV peak gradient, 5.2 mmHg. The mean mitral valve gradient is 3.0 mmHg. Tricuspid Valve: The tricuspid valve is normal in structure. Tricuspid valve regurgitation is trivial. No evidence of tricuspid stenosis. Aortic Valve: The aortic valve is normal in structure. Aortic valve regurgitation is not visualized. Aortic valve sclerosis is present, with no evidence of aortic valve stenosis. Aortic valve mean gradient measures 11.0 mmHg. Aortic valve peak gradient measures 18.6 mmHg. Aortic valve area, by VTI measures 1.95 cm. Pulmonic Valve: The pulmonic valve was normal in structure. Pulmonic valve regurgitation is not visualized. No evidence of pulmonic stenosis. Aorta: The aortic root is normal in size and structure. Venous: The inferior vena cava is normal in size with greater than 50% respiratory variability, suggesting right atrial pressure of 3 mmHg. IAS/Shunts: No atrial level shunt detected by color flow Doppler.  LEFT VENTRICLE PLAX 2D LVIDd:         4.00 cm      Diastology LVIDs:         3.00 cm      LV e' medial:    5.11 cm/s LV PW:         1.70 cm      LV E/e' medial:  17.1 LV IVS:        2.10 cm      LV e' lateral:   5.87 cm/s LVOT diam:     2.30 cm      LV E/e' lateral: 14.9 LV SV:         57 LV SV Index:   25 LVOT Area:     4.15 cm  LV Volumes (MOD) LV vol d, MOD A2C: 130.0 ml LV vol d, MOD A4C: 79.1 ml LV vol s, MOD A2C: 24.4 ml LV vol s, MOD A4C: 16.0 ml LV SV MOD A2C:     105.6 ml LV SV MOD A4C:     79.1 ml LV SV MOD BP:      89.8 ml RIGHT VENTRICLE RV S prime:     17.90 cm/s TAPSE (M-mode): 1.8 cm LEFT ATRIUM             Index         RIGHT ATRIUM           Index LA diam:        2.80 cm 1.25 cm/m   RA Area:     13.40 cm LA Vol (A2C):   32.3 ml 14.39 ml/m  RA Volume:   31.40 ml  13.99 ml/m LA Vol (A4C):   39.9 ml 17.78 ml/m LA Biplane Vol: 37.6 ml 16.76 ml/m  AORTIC VALVE                     PULMONIC VALVE AV Area (Vmax):    1.64 cm      PV Vmax:  0.80 m/s AV Area (Vmean):   1.63 cm      PV Peak grad:  2.6 mmHg AV Area (VTI):     1.95 cm AV Vmax:           215.50 cm/s AV Vmean:          152.000 cm/s AV VTI:            0.290 m AV Peak Grad:      18.6 mmHg AV Mean Grad:      11.0 mmHg LVOT Vmax:         84.90 cm/s LVOT Vmean:        59.700 cm/s LVOT VTI:          0.136 m LVOT/AV VTI ratio: 0.47  AORTA Ao Root diam: 3.70 cm MITRAL VALVE MV Area (PHT): 4.68 cm    SHUNTS MV Area VTI:   2.08 cm    Systemic VTI:  0.14 m MV Peak grad:  5.2 mmHg    Systemic Diam: 2.30 cm MV Mean grad:  3.0 mmHg MV Vmax:       1.14 m/s MV Vmean:      86.1 cm/s MV Decel Time: 162 msec MV E velocity: 87.40 cm/s MV A velocity: 96.00 cm/s MV E/A ratio:  0.91 Shaukat Khan Electronically signed by Neoma Laming Signature Date/Time: 01/22/2022/12:00:53 PM    Final     Medications: Scheduled:  albuterol  2.5 mg Nebulization Q6H   aspirin  81 mg Per Tube Daily   chlorhexidine gluconate (MEDLINE KIT)  15 mL Mouth Rinse BID   Chlorhexidine Gluconate Cloth  6 each Topical Q0600   docusate  100 mg Per Tube BID   epoetin (EPOGEN/PROCRIT) injection  4,000 Units Intravenous Q M,W,F-HD   feeding supplement (PROSource TF)  90 mL Per Tube TID   feeding supplement (VITAL HIGH PROTEIN)  1,000 mL Per Tube Q24H   free water  30 mL Per Tube Q4H   heparin  5,000 Units Subcutaneous Q8H   hydrocortisone sod succinate (SOLU-CORTEF) inj  100 mg Intravenous Q8H   insulin aspart  0-15 Units Subcutaneous Q4H   insulin glargine-yfgn  8 Units Subcutaneous BID   levothyroxine  100 mcg Intravenous Daily   mouth rinse  15 mL Mouth Rinse 10 times per day   multivitamin  1  tablet Per Tube QHS   pantoprazole (PROTONIX) IV  40 mg Intravenous Daily   polyethylene glycol  17 g Per Tube Daily   risperiDONE  0.5 mg Per Tube TID   rOPINIRole  0.25 mg Per Tube QHS   Continuous:  sodium chloride     feeding supplement (VITAL AF 1.2 CAL)     fentaNYL infusion INTRAVENOUS 75 mcg/hr (01/23/22 1536)   norepinephrine (LEVOPHED) Adult infusion 10 mcg/min (01/23/22 1900)   propofol (DIPRIVAN) infusion Stopped (01/23/22 1054)   thiamine injection Stopped (01/23/22 1012)   vancomycin 200 mL/hr at 01/23/22 1900   vasopressin 0.03 Units/min (01/23/22 1900)    Assessment: 66 year old female who presented to the hospital with severe metabolic derangements in the setting of missed hemodialysis sessions. She was subsequently intubated for respiratory distress. She has had persistent AMS since admission on Tuesday.  1. Exam continues to show findings consistent with diffuse cerebral hypofunction and did improve today despite holding sedation x30 min prior to exam. 2. Agree with Psychiatry that metabolic abnormalities due to failure to keep up with dialysis are most likely contributing to the patient's AMS. Also agree with  possible contribution from severe hypothyroidism.   3. Head CT: No acute intracranial abnormality. Mild chronic small vessel ischemia 4. EEG 1/28: Finding of continuous generalized slowing is suggestive of moderate diffuse encephalopathy, nonspecific to etiology. No seizures or epileptiform discharges were seen throughout the recording. 5. MRI brain: Punctate acute to subacute infarcts in the left inferior frontal gyrus, left occipital lobe, and right temporal lobe periventricular white matter. No associated hemorrhage or mass effect. Mild chronic white matter microangiopathy. 6. Although the tiny foci of acute infarction are not felt to be contributing to her AMS, will need stroke work up.  7. TTE showed no intracardiac clot. Bilateral infarcts c/w central embolic  source, but given pressor requirements will hold off on TEE at this time.     Recommendations: 1. MRI brain wo contrast - repeat tmrw given no improvement in exam r/o interval recurrent ischemic events 2. MRA head wo contrast tmrw 3. Carotid ultrasound 4. Continue cardiac telemetry 5. Contine ASA 6. BP management per primary team. No indication for permissive HTN.  7. Agree with Psychiatry that goal of treatment would be to relieve all possible medical conditions that could be contributing. 8. Agree with Psychiatry medication recommendations.  9. Repeat EEG tmrw  I updated husband by phone.  This patient is critically ill and at significant risk of neurological worsening, death and care requires constant monitoring of vital signs, hemodynamics,respiratory and cardiac monitoring, neurological assessment, discussion with family, other specialists and medical decision making of high complexity. I spent 45 minutes of neurocritical care time  in the care of  this patient. This was time spent independent of any time provided by nurse practitioner or PA.  Su Monks, MD [facility] [phone]  If 7pm- 7am, please page neurology on call as listed in [place].

## 2022-01-23 NOTE — Progress Notes (Signed)
Increased peep and o2 per NP without complications.

## 2022-01-23 NOTE — Progress Notes (Addendum)
NAME:  Regina Jackson, MRN:  [id number], DOB:  04/18/1956, LOS: 6 ADMISSION DATE:  01/04/2022, CONSULTATION DATE:  01/20/22 REFERRING MD:  Dr. Mal Misty, CHIEF COMPLAINT:  AMS, Acute Respiratory Distress   Brief Pt Description / Synopsis:  66 y.o. Female with PMH significant for ESRD on HD, admitted with Acute Metabolic Encephalopathy due to multiple severe metabolic derangements in setting of missed HD sessions and Hypothyroidism (query ? Myxedema coma).  Developed acute respiratory distress requiring intubation and mechanical ventilation.  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  Patient had no chest pain, shortness of breath, abdominal pain, nausea, vomiting, diarrhea.  The patient's husband reported she had not been compliant with her home hemodialysis in nearly 8 days, also unsure if she has been taking her thyroid medication.  ED Course: Initial vital signs: Temperature 97.7 orally, respiratory 15, pulse 96, blood pressure 199/163 Significant labs: Sodium 134, bicarbonate 16, glucose 211, BUN 49, creatinine 5.9, alkaline phosphatase 132, BNP 329, high-sensitivity troponin 42, WBC 11.3, hemoglobin 10.9, TSH 58.8 Venous blood gas: pH 7.3/PCO2 33/PO2 54/bicarbonate 16.2 COVID-19 PCR negative Imaging: Chest x-ray:Mild cardiomegaly with vascular congestion. Possible small left effusion. No consolidation or pneumothorax CT head:IMPRESSION: 1. No acute intracranial abnormality. 2. Mild chronic small vessel ischemia.   She was placed under IVC, and the hospitalist were asked to admit.  Nephrology was consulted for dialysis during her stay.  However given her agitation and confusion, dialysis  was unable to's be performed safely therefore he was deferred for reevaluation in the next day.  Interval History She was given IV Haldol and Valium with little improvement in her agitation.  Psychiatry was consulted.  On 01/20/2022 she remained altered, but developed acute respiratory distress requiring transfer to ICU and emergent endotracheal intubation and mechanical ventilation.   Pertinent  Medical History  ESRD on hemodialysis Noncompliance with dialysis Anemia of chronic disease Endometrial cancer Hypothyroidism Hypertension diabetes mellitus  Micro Data:  1/24: SARS-CoV-2 and influenza PCR>> negative 1/25: HIV screen>> nonreactive 1/27: MRSA PCR>> negative 1/27: Tracheal aspirate>>abundant GPC, Staph aureus, sensitivities pending  Antimicrobials:  1/29 vancomycin>>  Significant Hospital Events: Including procedures, antibiotic start and stop dates in addition to other pertinent events   1/24: Admitted by the hospitalist for altered mental status and hypertensive urgency.  Nephrology consulted 1/25: Unable to safely perform dialysis due to severe agitation and confusion.  Plan to reassess HD tomorrow 1/26: Received IV Haldol and Valium without improvement in mental status.  Psychiatry consulted 1/27: Required emergent intubation due to AMS and Acute Respiratory Distress.  MRI and EEG pending, Neuro consulted.  Plan for HD now that pt is sedated 1/28 remains on vent,poorly responsive on SAT, MRI today 1/29 copious ETT secretions, started Levaquin (allergies to ampicillin/Unasyn), cardiogram done, pending 1/30 MRI +_for acute CVA, severe resp failure  Interim History / Subjective:  Brain damage with strokes +pneumonia Severe resp distress Prognosis is very poor    Objective   Blood pressure (!) 114/52, pulse (!) 106, temperature 99.8 F (37.7 C), temperature source Oral, resp. rate 18, height 5' 7.52" (1.715 m), weight 114.4 kg, SpO2 92 %.    Vent Mode: PRVC FiO2  (%):  [40 %-70 %] 70 % Set Rate:  [20 bmp]  20 bmp Vt Set:  [420 mL-500 mL] 500 mL PEEP:  [5 cmH20-8 cmH20] 8 cmH20 Plateau Pressure:  [16 cmH20] 16 cmH20   Intake/Output Summary (Last 24 hours) at 01/23/2022 0817 Last data filed at 01/23/2022 0413 Gross per 24 hour  Intake 1403.06 ml  Output 55 ml  Net 1348.06 ml    Filed Weights   01/20/22 0837 01/20/22 1332 01/20/22 1752  Weight: 108.1 kg 108.1 kg 114.4 kg    REVIEW OF SYSTEMS  PATIENT IS UNABLE TO PROVIDE COMPLETE REVIEW OF SYSTEMS DUE TO SEVERE CRITICAL ILLNESS AND TOXIC METABOLIC ENCEPHALOPATHY    PHYSICAL EXAMINATION:  GENERAL:critically ill appearing, +resp distress EYES: Pupils equal, round, reactive to light.  No scleral icterus.  MOUTH: Moist mucosal membrane. INTUBATED NECK: Supple.  PULMONARY: +rhonchi, +wheezing CARDIOVASCULAR: S1 and S2.  No murmurs  GASTROINTESTINAL: Soft, nontender, -distended. Positive bowel sounds.  MUSCULOSKELETAL: No swelling, clubbing, or edema.  NEUROLOGIC: obtunded SKIN:intact,warm,dry  Resolved Hospital Problem list     Assessment & Plan:   Acute Hypoxic Respiratory Failure in the setting of severe metabolic derangements Inability to protect airway with acute CVA, STAPH PNEUMONIA Severe ACUTE Hypoxic and Hypercapnic Respiratory Failure -continue Mechanical Ventilator support -Wean Fio2 and PEEP as tolerated -VAP/VENT bundle implementation - Wean PEEP & FiO2 as tolerated, maintain SpO2 > 88% - Head of bed elevated 30 degrees, VAP protocol in place - Plateau pressures less than 30 cm H20  - Intermittent chest x-ray & ABG PRN - Ensure adequate pulmonary hygiene  -will NOT perform SAT/SBT when respiratory parameters are met   SEPTIC shock SOURCE-pneumonia -use vasopressors to keep MAP>65 as needed -follow ABG and LA as needed -follow up cultures -emperic ABX -consider stress dose steroids    Acute Metabolic Encephalopathy in setting of multiple severe metabolic  derangements (AKI and uremia, Hypothyroidism with ? Myxedema coma Multiple punctate acute to subacute Sedation needs in the setting of mechanical ventilation -Maintain a RASS goal of 0 to -1 -Fentanyl and Propofol to maintain RASS goal -Avoid sedating medications as able -Daily wake up assessment -Initial CT Head negative in ED -Obtain MRI and EEG -Consulted Neurology, appreciate input -Correction of multiple metabolic derangements as above -Thiamine IV -Synthroid IV   ESRD on Hemodialysis Mild Hyperkalemia Anion Gap Metabolic Acidosis -Monitor I&O's / urinary output -Follow BMP -Ensure adequate renal perfusion -Avoid nephrotoxic agents as able -Replace electrolytes as indicated -Nephrology following, appreciate input -HD as per Nephrology    ENDO - ICU hypoglycemic\Hyperglycemia protocol -check FSBS per protocol Diabetes Mellitus Hypothyroidism, query ? Myxedema Coma PMHx: Radioactive Iodine Thyroid ablation -CBG's q4h; Target range of 140 to 180 -SSI -Follow ICU Hypo/Hyperglycemia protocol -Continue IV Synthroid 100 mg daily (received 200 mg x1 earlier in stay) -Follow TSH and thyroid panel  GI GI PROPHYLAXIS as indicated  NUTRITIONAL STATUS DIET-->TF's as tolerated Constipation protocol as indicated   ELECTROLYTES -follow labs as needed -replace as needed -pharmacy consultation and following     Best Practice (right click and "Reselect all SmartList Selections" daily)   Diet/type: NPO DVT prophylaxis: prophylactic heparin  GI prophylaxis: PPI Lines: Central line and yes and it is still needed Foley:  Yes, and it is still needed Code Status:  full code Last date of multidisciplinary goals of care discussion [1/27]    Labs   CBC: Recent Labs  Lab 01/19/2022 1857 01/19/22 0918 01/20/22 0417 01/20/22 1058 01/21/22 0537 01/22/22 0340 01/23/22 0407  WBC 11.3* 14.0* 17.7* 17.7* 15.1* 17.8* 20.2*  NEUTROABS 8.9* 12.1*  15.3*  --   --   --   --    HGB 10.9* 10.2* 9.9* 9.1* 8.5* 8.3* 8.6*  HCT 33.7* 31.6* 31.5* 29.3* 27.7* 27.1* 27.6*  MCV 92.1 91.1 92.6 95.1 96.2 96.4 97.5  PLT 281 260 285 243 216 170 183     Basic Metabolic Panel: Recent Labs  Lab 01/20/22 0417 01/20/22 1058 01/21/22 0530 01/21/22 0537 01/22/22 0340 01/23/22 0407  NA 142 140 137  --  132* 135  K 5.0 5.3* 3.6  --  3.6 3.7  CL 112* 112* 105  --  101 99  CO2 13* 16* 24  --  20* 22  GLUCOSE 182* 233* 154*  --  230* 235*  BUN 61* 63* 33*  --  58* 86*  CREATININE 6.88* 7.37* 4.95*  --  6.22* 7.47*  CALCIUM 9.6 8.8* 8.5*  --  8.8* 8.8*  MG  --  2.6*  --  1.8 1.8 1.7  PHOS  --   --  4.5  --  6.3* 7.1*    GFR: Estimated Creatinine Clearance: 10.1 mL/min (A) (by C-G formula based on SCr of 7.47 mg/dL (H)). Recent Labs  Lab 01/20/22 1058 01/20/22 2252 01/21/22 0537 01/22/22 0340 01/23/22 0407  PROCALCITON  --  0.56 0.80 1.86  --   WBC 17.7*  --  15.1* 17.8* 20.2*     Liver Function Tests: Recent Labs  Lab 12/31/2021 1857 01/19/22 0918 01/20/22 0417 01/20/22 1058 01/21/22 0530 01/22/22 0340 01/23/22 0407  AST 39 50* 79* 67*  --   --   --   ALT 22 27 39 37  --   --   --   ALKPHOS 132* 119 126 114  --   --   --   BILITOT 0.8 0.8 0.8 0.7  --   --   --   PROT 7.6 7.1 7.4 6.7  --   --   --   ALBUMIN 3.5 3.3* 3.6 3.0* 2.9* 2.6* 2.3*    No results for input(s): LIPASE, AMYLASE in the last 168 hours. Recent Labs  Lab 01/19/22 0918  AMMONIA 16     ABG    Component Value Date/Time   PHART 7.18 (LL) 01/23/2022 0318   PCO2ART 43 01/23/2022 0318   PO2ART 84 01/23/2022 0318   HCO3 16.0 (L) 01/23/2022 0318   ACIDBASEDEF 11.7 (H) 01/23/2022 0318   O2SAT 93.1 01/23/2022 0318      Coagulation Profile: No results for input(s): INR, PROTIME in the last 168 hours.  Cardiac Enzymes: No results for input(s): CKTOTAL, CKMB, CKMBINDEX, TROPONINI in the last 168 hours.  HbA1C: Hgb A1c MFr Bld  Date/Time Value Ref Range Status  01/18/2022  06:25 AM 9.2 (H) 4.8 - 5.6 % Final    Comment:    (NOTE)         Prediabetes: 5.7 - 6.4         Diabetes: >6.4         Glycemic control for adults with diabetes: <7.0     CBG: Recent Labs  Lab 01/22/22 0735 01/22/22 1930 01/22/22 2332 01/23/22 0348 01/23/22 0727  GLUCAP 218* 216* 221* 205* 263*    MRI brain 1/28: IMPRESSION: 1. 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 No associated hemorrhage or mass effect. 2. Mild chronic white matter microangiopathy.  2D echo performed today, interpretation pending.   Review of Systems:   Unable  to assess due to AMS/mechanically ventilated status  Allergies Allergies  Allergen Reactions   Amoxicillin    Augmentin [Amoxicillin-Pot Clavulanate]    Ceftin [Cefuroxime]    Compazine [Prochlorperazine]    Demerol [Meperidine Hcl]    Macrobid [Nitrofurantoin]    Phenergan [Promethazine]    Sulfa Antibiotics      Medications   Scheduled Meds:  albuterol  2.5 mg Nebulization Q6H   aspirin  81 mg Per Tube Daily   chlorhexidine gluconate (MEDLINE KIT)  15 mL Mouth Rinse BID   Chlorhexidine Gluconate Cloth  6 each Topical Q0600   docusate  100 mg Per Tube BID   epoetin (EPOGEN/PROCRIT) injection  4,000 Units Intravenous Q M,W,F-HD   feeding supplement (PROSource TF)  90 mL Per Tube QID   feeding supplement (VITAL HIGH PROTEIN)  1,000 mL Per Tube Q24H   free water  30 mL Per Tube Q4H   heparin  5,000 Units Subcutaneous Q8H   insulin aspart  0-15 Units Subcutaneous Q4H   levothyroxine  100 mcg Intravenous Daily   mouth rinse  15 mL Mouth Rinse 10 times per day   multivitamin  1 tablet Per Tube QHS   multivitamin  15 mL Per Tube Daily   pantoprazole (PROTONIX) IV  40 mg Intravenous Daily   polyethylene glycol  17 g Per Tube Daily   risperiDONE  0.5 mg Per Tube TID   rOPINIRole  0.25 mg Per Tube QHS   Continuous  Infusions:  sodium chloride Stopped (01/20/22 1400)   norepinephrine (LEVOPHED) Adult infusion 15 mcg/min (01/23/22 0801)   propofol (DIPRIVAN) infusion 15 mcg/kg/min (01/23/22 0758)   thiamine injection 500 mg (01/22/22 1005)   vancomycin     vasopressin 0.03 Units/min (01/23/22 0623)   PRN Meds:.acetaminophen **OR** acetaminophen, alteplase, fentaNYL (SUBLIMAZE) injection, fentaNYL (SUBLIMAZE) injection, heparin, lidocaine (PF), lidocaine-prilocaine, ondansetron **OR** ondansetron (ZOFRAN) IV, pentafluoroprop-tetrafluoroeth, vancomycin     DVT/GI PRX  assessed I Assessed the need for Labs I Assessed the need for Foley I Assessed the need for Central Venous Line Family Discussion when available I Assessed the need for Mobilization I made an Assessment of medications to be adjusted accordingly Safety Risk assessment completed  CASE DISCUSSED IN MULTIDISCIPLINARY ROUNDS WITH ICU TEAM     Critical Care Time devoted to patient care services described in this note is 55 minutes.  Critical care was necessary to treat /prevent imminent and life-threatening deterioration. Overall, patient is critically ill, prognosis is guarded.  Patient with Multiorgan failure and at high risk for cardiac arrest and death.    Corrin Parker, M.D.  [facility]  Medical Director [facility] Cardio-Pulmonary Department

## 2022-01-23 NOTE — Progress Notes (Signed)
Nutrition Follow-up  DOCUMENTATION CODES:   Obesity unspecified  INTERVENTION:   -D/c Vital High Protein -Continue renal MVI daily via tube -D/c liquid MVI -Initiate Vital AF 1.2 @ 20 ml/hr via OGT and increase by 10 ml every 4 hours to goal rate of 55 ml/hr.   45 ml Prosource TF TID.    30 ml free water flush every 4 hours for tube patency  Tube feeding regimen provides 1704 kcal (100% of needs), 132 grams of protein, and 1071 ml of H2O. Total free water: 1251 ml daily  NUTRITION DIAGNOSIS:   Inadequate oral intake related to inability to eat (pt sedated and ventilated) as evidenced by NPO status.  Ongoing  GOAL:   Provide needs based on ASPEN/SCCM guidelines  Progressing   MONITOR:   Vent status, Labs, Weight trends, Skin, I & O's  REASON FOR ASSESSMENT:   Ventilator    ASSESSMENT:   66 y/o female with h/o ESRD on HD, anemia of chronic disease, DM, hypothyroidism, s/p radioactive iodine thyroid ablation, endometrial cancer and HTN who is admitted with AMS and myxedema coma.  1/28- EEG 1/29- MRI  Patient is currently intubated on ventilator support. OGT placement verified by x-ray.  MV: 11.6 L/min Temp (24hrs), Avg:99.4 F (37.4 C), Min:99 F (37.2 C), Max:100.2 F (37.9 C)  Case discussed with RN, MD, and during ICU rounds.   Pt is a full code and was admitted with AMS and encephalopathy; neuro consulted and recommending MRA of head as able. Pt non-compliant with HD PTA and was IVC'd. MD recommending palliative care consult for goals of care.   RN confirmed that pt is currently off propofol and tolerating TF well (Vital High Protein @ 20 ml/hr). RD to adjust TF now that pt is off propofol).   Medications reviewed and include colace, epogen, miralax, levophed, and vasopressin.   Labs reviewed: CBGS: 205-273 (inpatient orders for glycemic control are 0-15 units insulin aspart every 4 hours).    Diet Order:   Diet Order             Diet NPO time  specified  Diet effective now                   EDUCATION NEEDS:   No education needs have been identified at this time  Skin:  Skin Assessment: Reviewed RN Assessment  Last BM:  Unknown  Height:   Ht Readings from Last 1 Encounters:  01/20/22 5' 7.52" (1.715 m)    Weight:   Wt Readings from Last 1 Encounters:  01/20/22 114.4 kg    Ideal Body Weight:  63.6 kg  BMI:  Body mass index is 38.89 kg/m.  Estimated Nutritional Needs:   Kcal:  0947-0962  Protein:  > 127 grams  Fluid:  > 1.5 L    Loistine Chance, RD, LDN, [place] Registered Dietitian II Certified Diabetes Care and Education Specialist Please refer to [facility] for RD and/or RD on-call/weekend/after hours pager

## 2022-01-23 NOTE — Progress Notes (Signed)
[facility], [place] 01/23/22  Subjective:   LOS: 6  Patient remains intubated at the moment. Continues to require pressors. Also due for hemodialysis treatment today.   Objective:  Vital signs in last 24 hours:  Temp:  [98.9 F (37.2 C)-100.2 F (37.9 C)] 99.8 F (37.7 C) (01/30 0800) Pulse Rate:  [90-117] 99 (01/30 1000) Resp:  [15-27] 15 (01/30 1000) BP: (83-133)/(38-60) 125/50 (01/30 1000) SpO2:  [89 %-98 %] 98 % (01/30 1000) FiO2 (%):  [40 %-70 %] 70 % (01/30 0836)  Weight change:  Filed Weights   01/20/22 0837 01/20/22 1332 01/20/22 1752  Weight: 108.1 kg 108.1 kg 114.4 kg    Intake/Output:    Intake/Output Summary (Last 24 hours) at 01/23/2022 1039 Last data filed at 01/23/2022 0413 Gross per 24 hour  Intake 1163.06 ml  Output 55 ml  Net 1108.06 ml      Physical Exam: General: Critically ill-appearing  HEENT Endotracheal tube in place  Pulm/lungs Scattered rhonchi, vent assisted  CVS/Heart S1S2 no rub  Abdomen:  Soft, nontender  Extremities: Trace lower extremity edema  Neurologic: Currently intubated  Skin: Warm, dry  Access: Left arm AV fistula       Basic Metabolic Panel:  Recent Labs  Lab 01/20/22 0417 01/20/22 1058 01/21/22 0530 01/21/22 0537 01/22/22 0340 01/23/22 0407  NA 142 140 137  --  132* 135  K 5.0 5.3* 3.6  --  3.6 3.7  CL 112* 112* 105  --  101 99  CO2 13* 16* 24  --  20* 22  GLUCOSE 182* 233* 154*  --  230* 235*  BUN 61* 63* 33*  --  58* 86*  CREATININE 6.88* 7.37* 4.95*  --  6.22* 7.47*  CALCIUM 9.6 8.8* 8.5*  --  8.8* 8.8*  MG  --  2.6*  --  1.8 1.8 1.7  PHOS  --   --  4.5  --  6.3* 7.1*      CBC: Recent Labs  Lab 01/12/2022 1857 01/19/22 0918 01/20/22 0417 01/20/22 1058 01/21/22 0537 01/22/22 0340 01/23/22 0407  WBC 11.3* 14.0* 17.7* 17.7* 15.1* 17.8* 20.2*  NEUTROABS 8.9* 12.1* 15.3*  --   --   --   --   HGB 10.9* 10.2* 9.9* 9.1* 8.5* 8.3* 8.6*  HCT 33.7* 31.6* 31.5* 29.3*  27.7* 27.1* 27.6*  MCV 92.1 91.1 92.6 95.1 96.2 96.4 97.5  PLT 281 260 285 243 216 170 183       Lab Results  Component Value Date   HEPBSAG NON REACTIVE 01/20/2022   HEPBSAB Reactive (A) 01/20/2022      Microbiology:  Recent Results (from the past 240 hour(s))  Resp Panel by RT-PCR (Flu A&B, Covid) Nasopharyngeal Swab     Status: None   Collection Time: 01/08/2022  8:30 PM   Specimen: Nasopharyngeal Swab; Nasopharyngeal(NP) swabs in vial transport medium  Result Value Ref Range Status   SARS Coronavirus 2 by RT PCR NEGATIVE NEGATIVE Final    Comment: (NOTE) SARS-CoV-2 target nucleic acids are NOT DETECTED.  The SARS-CoV-2 RNA is generally detectable in upper respiratory specimens during the acute phase of infection. The lowest concentration of SARS-CoV-2 viral copies this assay can detect is 138 copies/mL. A negative result does not preclude SARS-Cov-2 infection and should not be used as the sole basis for treatment or other patient management decisions. A negative result may occur with  improper specimen collection/handling, submission of specimen other than nasopharyngeal swab, presence of viral  mutation(s) within the °areas targeted by this assay, and inadequate number of viral °copies(<138 copies/mL). A negative result must be combined with °clinical observations, patient history, and epidemiological °information. The expected result is Negative. ° °Fact Sheet for Patients:  °https://www.fda.gov/media/152166/download ° °Fact Sheet for Healthcare Providers:  °https://www.fda.gov/media/152162/download ° °This test is no t yet approved or cleared by the [place] FDA and  °has been authorized for detection and/or diagnosis of SARS-CoV-2 by °FDA under an Emergency Use Authorization (EUA). This EUA will remain  °in effect (meaning this test can be used) for the duration of the °COVID-19 declaration under Section 564(b)(1) of the Act, 21 °U.S.C.section 360bbb-3(b)(1), unless the  authorization is terminated  °or revoked sooner.  ° ° °  ° Influenza A by PCR NEGATIVE NEGATIVE Final  ° Influenza B by PCR NEGATIVE NEGATIVE Final  °  Comment: (NOTE) °The Xpert Xpress SARS-CoV-2/FLU/RSV plus assay is intended as an aid °in the diagnosis of influenza from Nasopharyngeal swab specimens and °should not be used as a sole basis for treatment. Nasal washings and °aspirates are unacceptable for Xpert Xpress SARS-CoV-2/FLU/RSV °testing. ° °Fact Sheet for Patients: °https://www.fda.gov/media/152166/download ° °Fact Sheet for Healthcare Providers: °https://www.fda.gov/media/152162/download ° °This test is not yet approved or cleared by the [place] FDA and °has been authorized for detection and/or diagnosis of SARS-CoV-2 by °FDA under an Emergency Use Authorization (EUA). This EUA will remain °in effect (meaning this test can be used) for the duration of the °COVID-19 declaration under Section 564(b)(1) of the Act, 21 U.S.C. °section 360bbb-3(b)(1), unless the authorization is terminated or °revoked. ° °Performed at [facility], [address] [place], °[place] [address] °  °MRSA Next Gen by PCR, Nasal     Status: None  ° Collection Time: 01/20/22  8:50 AM  ° Specimen: Nasal Mucosa; Nasal Swab  °Result Value Ref Range Status  ° MRSA by PCR Next Gen NOT DETECTED NOT DETECTED Final  °  Comment: (NOTE) °The GeneXpert MRSA Assay (FDA approved for NASAL specimens only), °is one component of a comprehensive MRSA colonization surveillance °program. It is not intended to diagnose MRSA infection nor to guide °or monitor treatment for MRSA infections. °Test performance is not FDA approved in patients less than 2 years °old. °Performed at [facility], [address] [place], °[place] [address] °  °Culture, Respiratory w Gram Stain     Status: None  ° Collection Time: 01/21/22  2:46 AM  ° Specimen: Tracheal Aspirate; Respiratory  °Result Value Ref Range Status  ° Specimen Description    Final  °  TRACHEAL ASPIRATE °Performed at [facility], [address] [place], [place] [address] °  ° Special Requests   Final  °  NONE °Performed at  Hospital Lab, [address] , [place] [address] °  ° Gram Stain   Final  °  ABUNDANT WBC PRESENT, PREDOMINANTLY PMN °ABUNDANT GRAM POSITIVE COCCI IN CLUSTERS °Performed at [facility], [address] [place], [place] [address] °  ° Culture ABUNDANT STAPHYLOCOCCUS AUREUS  Final  ° Report Status 01/23/2022 FINAL  Final  ° Organism ID, Bacteria STAPHYLOCOCCUS AUREUS  Final  °    Susceptibility  ° Staphylococcus aureus - MIC*  °  CIPROFLOXACIN <=0.5 SENSITIVE Sensitive   °  ERYTHROMYCIN <=0.25 SENSITIVE Sensitive   °  GENTAMICIN <=0.5 SENSITIVE Sensitive   °  OXACILLIN <=0.25 SENSITIVE Sensitive   °  TETRACYCLINE <=1 SENSITIVE Sensitive   °  VANCOMYCIN <=0.5 SENSITIVE   Sensitive   °  TRIMETH/SULFA <=10 SENSITIVE Sensitive   °  CLINDAMYCIN <=0.25 SENSITIVE Sensitive   °  RIFAMPIN <=0.5 SENSITIVE Sensitive   °  Inducible Clindamycin NEGATIVE Sensitive   °  * ABUNDANT STAPHYLOCOCCUS AUREUS  ° ° °Coagulation Studies: °No results for input(s): LABPROT, INR in the last 72 hours. ° °Urinalysis: °No results for input(s): COLORURINE, LABSPEC, PHURINE, GLUCOSEU, HGBUR, BILIRUBINUR, KETONESUR, PROTEINUR, UROBILINOGEN, NITRITE, LEUKOCYTESUR in the last 72 hours. ° °Invalid input(s): APPERANCEUR °  ° ° °Imaging: °MR BRAIN WO CONTRAST ° °Result Date: 01/21/2022 °CLINICAL DATA:  Altered mental status EXAM: MRI HEAD WITHOUT CONTRAST TECHNIQUE: Multiplanar, multiecho pulse sequences of the brain and surrounding structures were obtained without intravenous contrast. COMPARISON:  CT head 01/06/2022 FINDINGS: Brain: There is a punctate acute to early subacute infarct in the left inferior frontal gyrus. There is a small acute to subacute infarct in the right posterior temporal lobe periventricular white matter (5-27). There is a punctate acute to subacute  infarct in the left occipital lobe (5-19). There is no associated hemorrhage or mass effect. There is no other evidence of acute infarct. There is no acute intracranial hemorrhage or extra-axial fluid collection Background parenchymal volume is normal. The ventricles are normal in size. Patchy foci of FLAIR signal abnormality in the subcortical and periventricular white matter likely reflects sequela of mild chronic white matter microangiopathy. There is no mass lesion.  There is no midline shift. Vascular: Normal flow voids. Skull and upper cervical spine: Thickening of the right occipital cortex is again seen, without aggressive features. There is no suspicious marrow signal abnormality. Sinuses/Orbits: The paranasal sinuses are clear. The globes and orbits are unremarkable. Other: None. IMPRESSION: 1. 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 No associated hemorrhage or mass effect. 2. Mild chronic white matter microangiopathy. Electronically Signed   By: Peter  Noone M.D.   On: 01/21/2022 15:17  ° °DG Chest Port 1 View ° °Result Date: 01/23/2022 °CLINICAL DATA:  Hypoxia, intubated EXAM: PORTABLE CHEST 1 VIEW COMPARISON:  01/21/2022 FINDINGS: Endotracheal tube at the thoracic inlet, 8 cm above the carina. Enteric tube courses into the mid stomach. Cardiomegaly with pulmonary vascular congestion. No frank interstitial edema. Small left pleural effusion. No pneumothorax. Left IJ venous catheter terminates in the proximal SVC. IMPRESSION: Endotracheal tube at the thoracic inlet, 8 cm above the carina. Additional support apparatus as above. Pulmonary vascular congestion without frank interstitial edema. Small left pleural effusion. Electronically Signed   By: Sriyesh  Krishnan M.D.   On: 01/23/2022 03:10  ° °ECHOCARDIOGRAM COMPLETE ° °Result Date: 01/22/2022 °   ECHOCARDIOGRAM REPORT   Patient Name:   Regina Jackson  Date of Exam: 01/22/2022 Medical Rec #:  [id number]      Height:       67.5 in Accession #:    [id number]     Weight:       252.2 lb Date of Birth:  04/01/1956      BSA:          2.244 m² Patient Age:    65 years       BP:           122/61 mmHg Patient Gender: F              HR:           102 bpm. Exam Location:  [facility] Procedure: 2D Echo and Intracardiac Opacification   Agent Indications:     Stroke  History:         Patient has no prior history of Echocardiogram examinations.                  Risk Factors:ERSD.  Sonographer:     L Thornton-Maynard Referring Phys:  [id number] Level Green L RUST-CHESTER Diagnosing Phys: Neoma Laming  Sonographer Comments: Echo performed with patient supine and on artificial respirator and patient is morbidly obese. Image acquisition challenging due to patient body habitus. IMPRESSIONS  1. Left ventricular ejection fraction, by estimation, is 60 to 65%. The left ventricle has normal function. The left ventricle has no regional wall motion abnormalities. Left ventricular diastolic parameters are consistent with Grade III diastolic dysfunction (restrictive).  2. Right ventricular systolic function is normal. The right ventricular size is normal.  3. Left atrial size was mild to moderately dilated.  4. Right atrial size was mild to moderately dilated.  5. The mitral valve is normal in structure. Trivial mitral valve regurgitation. No evidence of mitral stenosis.  6. The aortic valve is normal in structure. Aortic valve regurgitation is not visualized. Aortic valve sclerosis is present, with no evidence of aortic valve stenosis.  7. The inferior vena cava is normal in size with greater than 50% respiratory variability, suggesting right atrial pressure of 3 mmHg. FINDINGS  Left Ventricle: Left ventricular ejection fraction, by estimation, is 60 to 65%. The left ventricle has normal function. The left ventricle has no regional wall motion abnormalities. Definity contrast agent was given IV to delineate  the left ventricular  endocardial borders. The left ventricular internal cavity size was normal in size. There is no left ventricular hypertrophy. Left ventricular diastolic parameters are consistent with Grade III diastolic dysfunction (restrictive). Right Ventricle: The right ventricular size is normal. No increase in right ventricular wall thickness. Right ventricular systolic function is normal. Left Atrium: Left atrial size was mild to moderately dilated. Right Atrium: Right atrial size was mild to moderately dilated. Pericardium: There is no evidence of pericardial effusion. Mitral Valve: The mitral valve is normal in structure. Trivial mitral valve regurgitation. No evidence of mitral valve stenosis. MV peak gradient, 5.2 mmHg. The mean mitral valve gradient is 3.0 mmHg. Tricuspid Valve: The tricuspid valve is normal in structure. Tricuspid valve regurgitation is trivial. No evidence of tricuspid stenosis. Aortic Valve: The aortic valve is normal in structure. Aortic valve regurgitation is not visualized. Aortic valve sclerosis is present, with no evidence of aortic valve stenosis. Aortic valve mean gradient measures 11.0 mmHg. Aortic valve peak gradient measures 18.6 mmHg. Aortic valve area, by VTI measures 1.95 cm. Pulmonic Valve: The pulmonic valve was normal in structure. Pulmonic valve regurgitation is not visualized. No evidence of pulmonic stenosis. Aorta: The aortic root is normal in size and structure. Venous: The inferior vena cava is normal in size with greater than 50% respiratory variability, suggesting right atrial pressure of 3 mmHg. IAS/Shunts: No atrial level shunt detected by color flow Doppler.  LEFT VENTRICLE PLAX 2D LVIDd:         4.00 cm      Diastology LVIDs:         3.00 cm      LV e' medial:    5.11 cm/s LV PW:         1.70 cm      LV E/e' medial:  17.1 LV IVS:        2.10 cm      LV e' lateral:  5.87 cm/s LVOT diam:     2.30 cm      LV E/e' lateral: 14.9 LV SV:         57 LV SV  Index:   25 LVOT Area:     4.15 cm²  LV Volumes (MOD) LV vol d, MOD A2C: 130.0 ml LV vol d, MOD A4C: 79.1 ml LV vol s, MOD A2C: 24.4 ml LV vol s, MOD A4C: 16.0 ml LV SV MOD A2C:     105.6 ml LV SV MOD A4C:     79.1 ml LV SV MOD BP:      89.8 ml RIGHT VENTRICLE RV S prime:     17.90 cm/s TAPSE (M-mode): 1.8 cm LEFT ATRIUM             Index        RIGHT ATRIUM           Index LA diam:        2.80 cm 1.25 cm/m²   RA Area:     13.40 cm² LA Vol (A2C):   32.3 ml 14.39 ml/m²  RA Volume:   31.40 ml  13.99 ml/m² LA Vol (A4C):   39.9 ml 17.78 ml/m² LA Biplane Vol: 37.6 ml 16.76 ml/m²  AORTIC VALVE                     PULMONIC VALVE AV Area (Vmax):    1.64 cm²      PV Vmax:       0.80 m/s AV Area (Vmean):   1.63 cm²      PV Peak grad:  2.6 mmHg AV Area (VTI):     1.95 cm² AV Vmax:           215.50 cm/s AV Vmean:          152.000 cm/s AV VTI:            0.290 m AV Peak Grad:      18.6 mmHg AV Mean Grad:      11.0 mmHg LVOT Vmax:         84.90 cm/s LVOT Vmean:        59.700 cm/s LVOT VTI:          0.136 m LVOT/AV VTI ratio: 0.47  AORTA Ao Root diam: 3.70 cm MITRAL VALVE MV Area (PHT): 4.68 cm²    SHUNTS MV Area VTI:   2.08 cm²    Systemic VTI:  0.14 m MV Peak grad:  5.2 mmHg    Systemic Diam: 2.30 cm MV Mean grad:  3.0 mmHg MV Vmax:       1.14 m/s MV Vmean:      86.1 cm/s MV Decel Time: 162 msec MV E velocity: 87.40 cm/s MV A velocity: 96.00 cm/s MV E/A ratio:  0.91 Shaukat Khan Electronically signed by Shaukat Khan Signature Date/Time: 01/22/2022/12:00:53 PM    Final    ° ° °Medications:  ° ° sodium chloride Stopped (01/20/22 1400)  ° norepinephrine (LEVOPHED) Adult infusion 10 mcg/min (01/23/22 0949)  ° propofol (DIPRIVAN) infusion 15 mcg/kg/min (01/23/22 0758)  ° thiamine injection 500 mg (01/23/22 0942)  ° vancomycin    ° vasopressin 0.03 Units/min (01/23/22 0623)  ° ° albuterol  2.5 mg Nebulization Q6H  ° aspirin  81 mg Per Tube Daily  ° chlorhexidine gluconate (MEDLINE KIT)  15 mL Mouth Rinse BID  ° Chlorhexidine Gluconate  Cloth  6 each Topical Q0600  ° docusate  100 mg Per   Tube BID  ° epoetin (EPOGEN/PROCRIT) injection  4,000 Units Intravenous Q M,W,F-HD  ° feeding supplement (PROSource TF)  90 mL Per Tube QID  ° feeding supplement (VITAL HIGH PROTEIN)  1,000 mL Per Tube Q24H  ° free water  30 mL Per Tube Q4H  ° heparin  5,000 Units Subcutaneous Q8H  ° insulin aspart  0-15 Units Subcutaneous Q4H  ° levothyroxine  100 mcg Intravenous Daily  ° mouth rinse  15 mL Mouth Rinse 10 times per day  ° multivitamin  1 tablet Per Tube QHS  ° multivitamin  15 mL Per Tube Daily  ° pantoprazole (PROTONIX) IV  40 mg Intravenous Daily  ° polyethylene glycol  17 g Per Tube Daily  ° risperiDONE  0.5 mg Per Tube TID  ° rOPINIRole  0.25 mg Per Tube QHS  ° °acetaminophen **OR** acetaminophen, alteplase, fentaNYL (SUBLIMAZE) injection, fentaNYL (SUBLIMAZE) injection, heparin, lidocaine (PF), lidocaine-prilocaine, ondansetron **OR** ondansetron (ZOFRAN) IV, pentafluoroprop-tetrafluoroeth, vancomycin ° °Assessment/ Plan:  °65 y.o. female with medical problems of  End-stage renal disease, on dialysis since 2015, hypothyroidism, hyperlipidemia, diabetes, peripheral neuropathy, morbid obesity, mono, gammopathy, ischemic optic neuropathy, regarding oral thrush, COVID-19 in September 2022 history of endometrial carcinoma, obstructive sleep apnea was admitted on 01/08/2022 for ° °Principal Problem: °  Acute metabolic encephalopathy °Active Problems: °  Chronic home hemodialysis status (HCC) °  Hypertensive urgency °  Non-compliance with renal dialysis (HCC) °  Anemia of chronic kidney failure, stage 5 (HCC) °  Elevated troponin °  Fluid overload °  Involuntary commitment °  Type 2 diabetes mellitus with kidney complication, with long-term current use of insulin (HCC) °  Hypothyroidism °  Endometrial cancer (HCC) °  H/O radioactive iodine thyroid ablation °  Acute respiratory failure (HCC) °  Endotracheally intubated °  On mechanically assisted ventilation  (HCC) ° °Acute metabolic encephalopathy [G93.41] ° °#. ESRD, with hyperkalemia °Patient due for hemodialysis treatment today.  Okay to maintain pressors to achieve stable blood pressure during dialysis treatment. ° °#. Anemia of CKD ° °Lab Results  °Component Value Date  ° HGB 8.6 (L) 01/23/2022  ° °Continue Epogen 4000 units IV with dialysis treatments. ° °#. Secondary hyperparathyroidism of renal origin N 25.81  ° °   °Component Value Date/Time  ° PTH 492 (H) 01/20/2022 1304  ° °Lab Results  °Component Value Date  ° PHOS 7.1 (H) 01/23/2022  ° °Phosphorus noted to be high at 7.1 at the moment.  Should come down with ongoing dialysis treatments.  Patient not currently on binders.   ° ° °#. Diabetes type 2 with CKD °Hgb A1c MFr Bld (%)  °Date Value  °01/18/2022 9.2 (H)  °Plan as hospitalist team ° °#Severe hypothyroidism, myxedema coma °Patient transitioned to PO synthroid.  ° ° LOS: 6 °  °1/30/202310:39 AM ° °[facility] °[place], [place] °[phone]  °

## 2022-01-24 ENCOUNTER — Inpatient Hospital Stay: Payer: BLUE CROSS/BLUE SHIELD

## 2022-01-24 DIAGNOSIS — G9341 Metabolic encephalopathy: Secondary | ICD-10-CM | POA: Diagnosis not present

## 2022-01-24 LAB — CBC
HCT: 25.4 % — ABNORMAL LOW (ref 36.0–46.0)
Hemoglobin: 8.2 g/dL — ABNORMAL LOW (ref 12.0–15.0)
MCH: 30.6 pg (ref 26.0–34.0)
MCHC: 32.3 g/dL (ref 30.0–36.0)
MCV: 94.8 fL (ref 80.0–100.0)
Platelets: 196 10*3/uL (ref 150–400)
RBC: 2.68 MIL/uL — ABNORMAL LOW (ref 3.87–5.11)
RDW: 14.1 % (ref 11.5–15.5)
WBC: 24 10*3/uL — ABNORMAL HIGH (ref 4.0–10.5)
nRBC: 0.1 % (ref 0.0–0.2)

## 2022-01-24 LAB — GLUCOSE, CAPILLARY
Glucose-Capillary: 231 mg/dL — ABNORMAL HIGH (ref 70–99)
Glucose-Capillary: 246 mg/dL — ABNORMAL HIGH (ref 70–99)
Glucose-Capillary: 248 mg/dL — ABNORMAL HIGH (ref 70–99)
Glucose-Capillary: 252 mg/dL — ABNORMAL HIGH (ref 70–99)
Glucose-Capillary: 253 mg/dL — ABNORMAL HIGH (ref 70–99)
Glucose-Capillary: 263 mg/dL — ABNORMAL HIGH (ref 70–99)
Glucose-Capillary: 265 mg/dL — ABNORMAL HIGH (ref 70–99)
Glucose-Capillary: 272 mg/dL — ABNORMAL HIGH (ref 70–99)
Glucose-Capillary: 275 mg/dL — ABNORMAL HIGH (ref 70–99)
Glucose-Capillary: 313 mg/dL — ABNORMAL HIGH (ref 70–99)

## 2022-01-24 LAB — MAGNESIUM: Magnesium: 1.7 mg/dL (ref 1.7–2.4)

## 2022-01-24 LAB — BASIC METABOLIC PANEL
Anion gap: 11 (ref 5–15)
BUN: 58 mg/dL — ABNORMAL HIGH (ref 8–23)
CO2: 26 mmol/L (ref 22–32)
Calcium: 8.7 mg/dL — ABNORMAL LOW (ref 8.9–10.3)
Chloride: 93 mmol/L — ABNORMAL LOW (ref 98–111)
Creatinine, Ser: 5.14 mg/dL — ABNORMAL HIGH (ref 0.44–1.00)
GFR, Estimated: 9 mL/min — ABNORMAL LOW (ref >60)
Glucose, Bld: 289 mg/dL — ABNORMAL HIGH (ref 70–99)
Potassium: 3.5 mmol/L (ref 3.5–5.1)
Sodium: 130 mmol/L — ABNORMAL LOW (ref 135–145)

## 2022-01-24 LAB — HEPATITIS B SURFACE ANTIBODY, QUANTITATIVE: Hep B S AB Quant (Post): 9.7 m[IU]/mL — ABNORMAL LOW (ref >9.9)

## 2022-01-24 LAB — PHOSPHORUS: Phosphorus: 6 mg/dL — ABNORMAL HIGH (ref 2.5–4.6)

## 2022-01-24 LAB — TRIGLYCERIDES: Triglycerides: 223 mg/dL — ABNORMAL HIGH (ref <150)

## 2022-01-24 IMAGING — MR MR HEAD W/O CM
11 series · 45 of 48 positions shown · non-contrast
Comparison: [DATE] MRI

CLINICAL DATA: Stroke suspected altered mental status and increased
agitation

EXAM:
MRI HEAD WITHOUT CONTRAST
TECHNIQUE: Multiplanar, multiecho pulse sequences of the brain and surrounding
structures were obtained without intravenous contrast.

[Series 5: ax dwi_tracew · axial · 3.0mm · 0.65mm/px · z∈[-145,+8]mm · 4 of 48 slices shown]
[im 1/48]
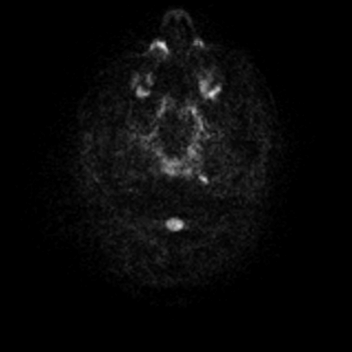
[im 16/48]
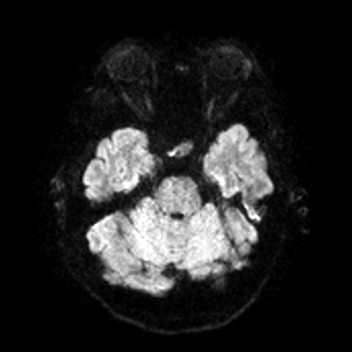
[im 32/48]
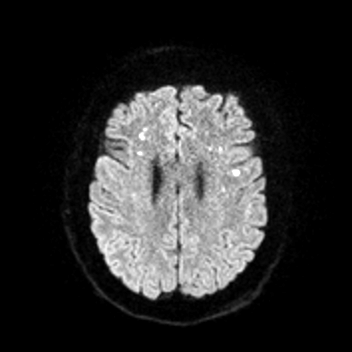
[im 48/48]
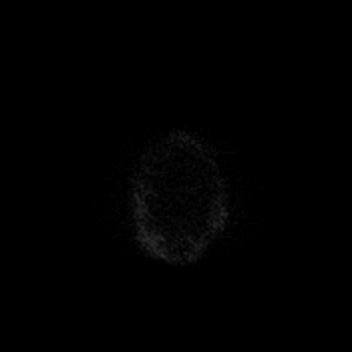

[Series 6: ax dwi_adc · axial · 3.0mm · 0.65mm/px · z∈[-145,+8]mm · 4 of 48 slices shown]
[im 1/48]
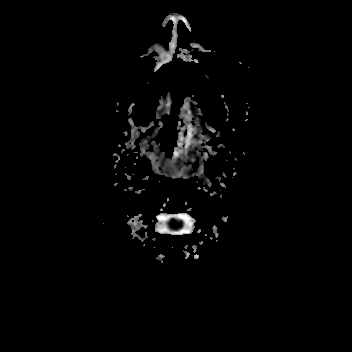
[im 16/48]
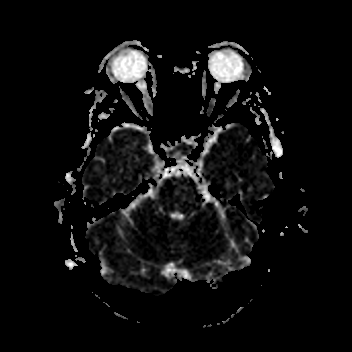
[im 32/48]
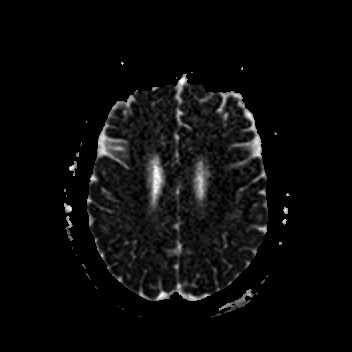
[im 48/48]
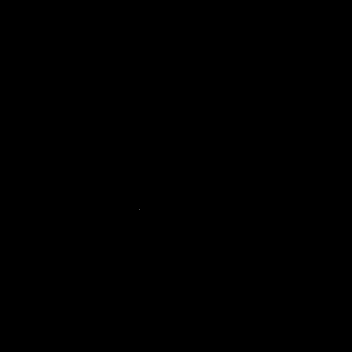

[Series 7: cor dwi_tracew · coronal · 5.0mm · 0.65mm/px · 3 of 38 slices shown]
[im 1/38]
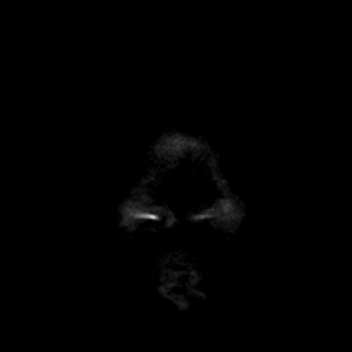
[im 19/38]
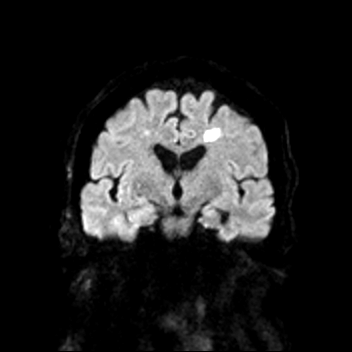
[im 38/38]
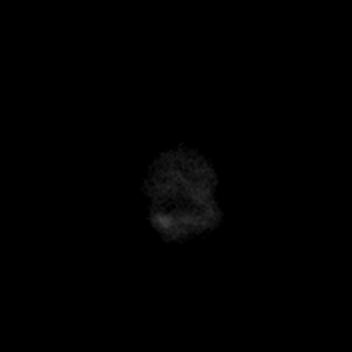

[Series 8: cor dwi_adc · coronal · 5.0mm · 0.65mm/px · 3 of 37 slices shown]
[im 1/37]
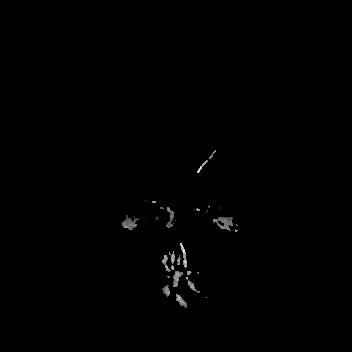
[im 19/37]
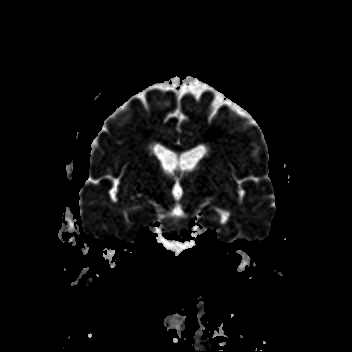
[im 37/37]
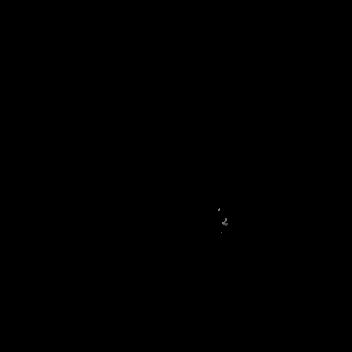

[Series 9: T1 · sagittal · 5.0mm · 0.62mm/px · 2 of 22 slices shown (1 of 2)]
[im 1/22]
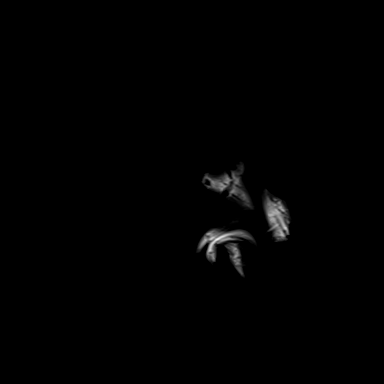
[im 22/22]
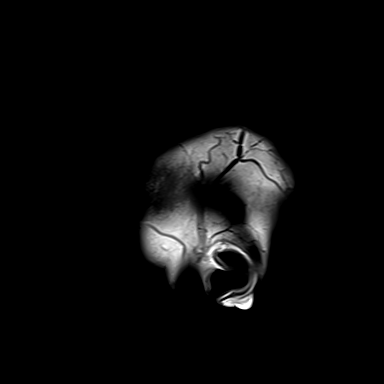

[Series 10: T2 · axial · 5.0mm · 0.53mm/px · z∈[-135,+7]mm · 2 of 25 slices shown (1 of 2)]
[im 1/25]
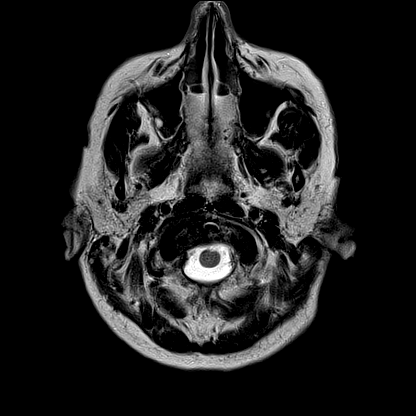
[im 25/25]
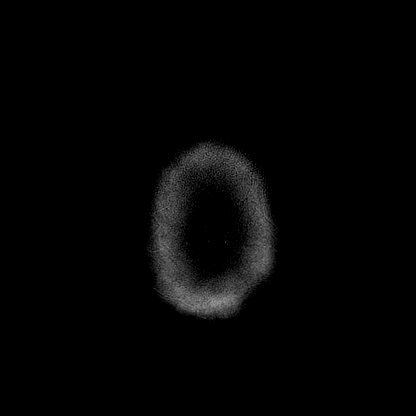

[Series 12: pha_images · axial · 3.0mm · 0.90mm/px · z∈[-149,+23]mm · 5 of 59 slices shown]
[im 1/59]
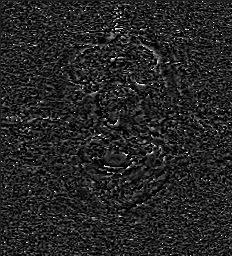
[im 15/59]
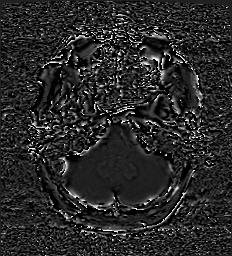
[im 30/59]
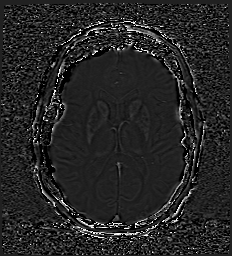
[im 44/59]
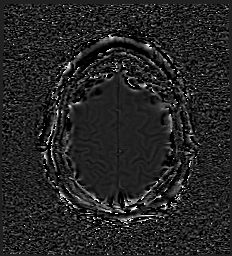
[im 59/59]
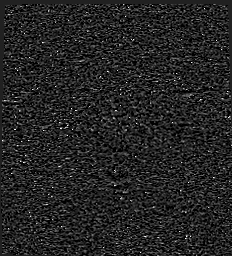

[Series 13: swi_images · axial · 3.0mm · 0.90mm/px · z∈[-152,+23]mm · 5 of 60 slices shown]
[im 1/60]
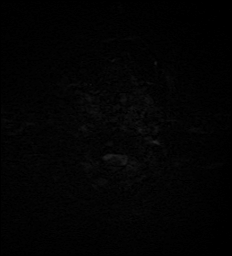
[im 15/60]
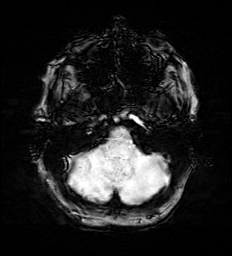
[im 30/60]
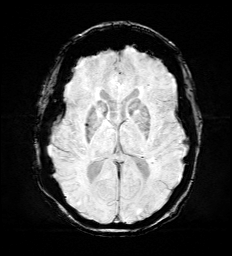
[im 45/60]
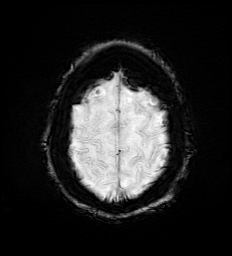
[im 60/60]
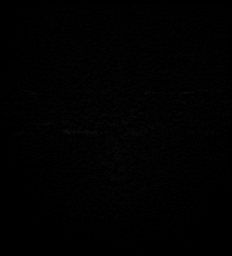

[Series 15: FLAIR · axial · 3.0mm · 0.53mm/px · z∈[-144,+15]mm · 4 of 55 slices shown]
[im 1/55]
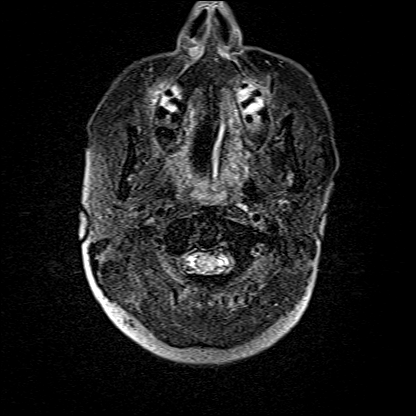
[im 19/55]
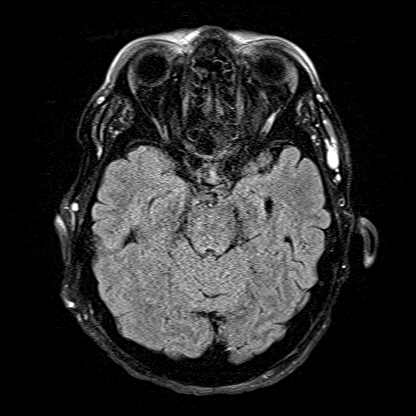
[im 37/55]
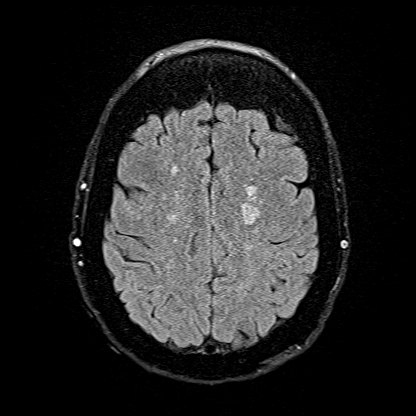
[im 55/55]
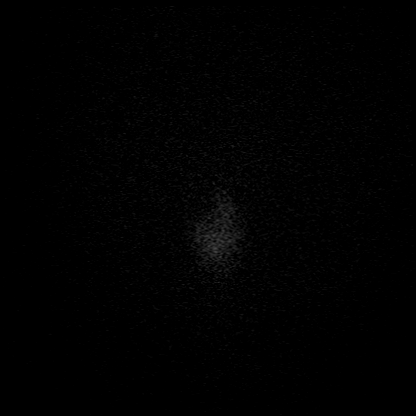

[Series 16: T1 · axial · 1.0mm · 0.98mm/px · z∈[-150,+22]mm · 11 of 176 slices shown (2 of 2)]
[im 1/176]
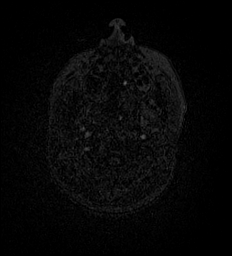
[im 14/176]
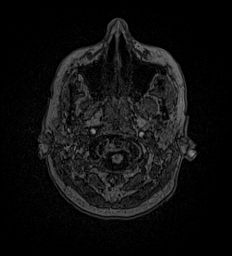
[im 27/176]
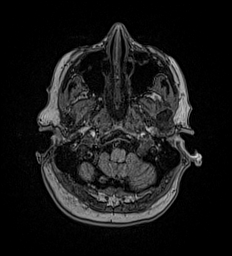
[im 41/176]
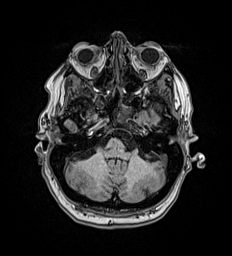
[im 54/176]
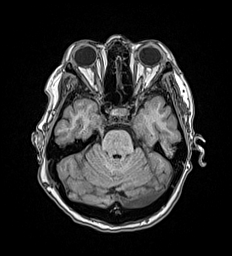
[im 68/176]
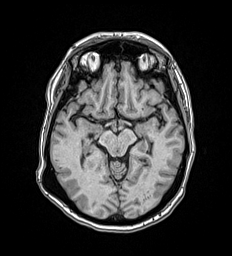
[im 81/176]
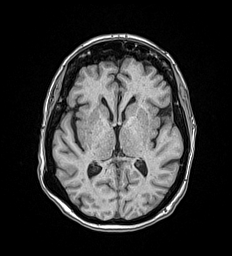
[im 95/176]
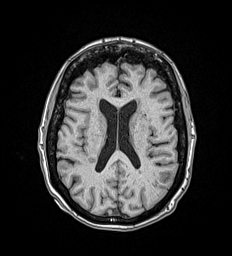
[im 122/176]
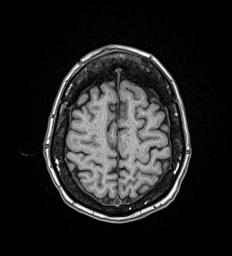
[im 149/176]
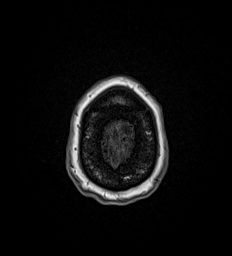
[im 176/176]
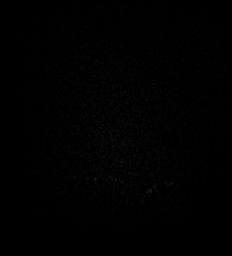

[Series 17: T2 · coronal · 5.0mm · 0.57mm/px · 2 of 27 slices shown (2 of 2)]
[im 1/27]
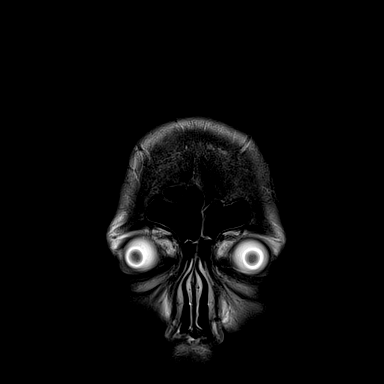
[im 27/27]
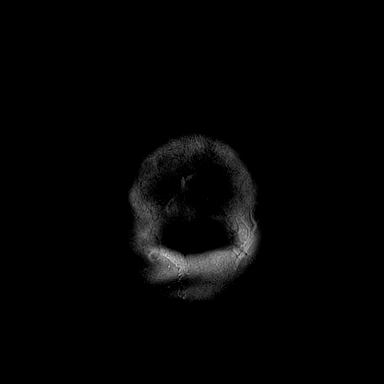

[45 of 48 positions shown; findings below may reference images not displayed]

FINDINGS: Brain: Numerous new foci of restricted diffusion in the bilateral
cerebral hemispheres, which are new from the prior exam and
demonstrate ADC correlates. Some of these are in the ACA-PCA
watershed territory (series 5, image 33-35), while others are
cortical, as seen in the right frontal lobe (series 5, image 21,
right temporal lobe (series 5, image 25 right occipital lobe (series
5, image 21), and left occipital lobe (series 5, image 23).
Redemonstrated subacute infarcts seen on the prior exam, including
in the left frontal lobe and right posterior temporal lobe.

No acute hemorrhage, mass, mass effect, or midline shift. No
hydrocephalus or extra-axial collection.

Vascular: Normal flow voids.

Skull and upper cervical spine: Normal marrow signal.

Sinuses/Orbits: Mucosal thickening in the sphenoid sinuses, with
air-fluid level in the left sphenoid sinus. The sinuses are
otherwise largely clear. The orbits are unremarkable.

Other: Fluid throughout the bilateral mastoid air cells.
IMPRESSION: 1. Numerous new acute infarcts in the bilateral cerebral
hemispheres, some which are in the ACA-PCA watershed territory,
while others are in the cortex of the right frontal, temporal, and
occipital lobe and left occipital lobe. Given multiple vascular
territories, a central embolic etiology should be considered.

2. Air-fluid level in the left sphenoid sinus, as can be seen with
acute sinusitis. Correlate with symptoms.

Impression #1 will be called to the ordering clinician or
representative by the Radiologist Assistant, and communication
documented in the PACS or [REDACTED].

## 2022-01-24 MED ORDER — MAGNESIUM SULFATE 2 GM/50ML IV SOLN
2.0000 g | Freq: Once | INTRAVENOUS | Status: AC
  Administered 2022-01-24: 2 g via INTRAVENOUS
  Filled 2022-01-24: qty 50

## 2022-01-24 MED ORDER — MIDODRINE HCL 5 MG PO TABS
10.0000 mg | ORAL_TABLET | Freq: Three times a day (TID) | ORAL | Status: DC
  Administered 2022-01-24 – 2022-01-27 (×11): 10 mg
  Filled 2022-01-24 (×11): qty 2

## 2022-01-24 MED ORDER — MIDAZOLAM HCL 2 MG/2ML IJ SOLN
2.0000 mg | INTRAMUSCULAR | Status: DC | PRN
  Administered 2022-01-25 – 2022-01-27 (×5): 2 mg via INTRAVENOUS
  Filled 2022-01-24 (×5): qty 2

## 2022-01-24 MED ORDER — FENTANYL CITRATE PF 50 MCG/ML IJ SOSY
25.0000 ug | PREFILLED_SYRINGE | INTRAMUSCULAR | Status: DC | PRN
  Administered 2022-01-24 – 2022-01-25 (×4): 100 ug via INTRAVENOUS
  Administered 2022-01-26: 50 ug via INTRAVENOUS
  Filled 2022-01-24: qty 1
  Filled 2022-01-24 (×2): qty 2

## 2022-01-24 MED ORDER — FENTANYL CITRATE PF 50 MCG/ML IJ SOSY: 25.0000 ug | PREFILLED_SYRINGE | INTRAMUSCULAR | Status: DC | PRN

## 2022-01-24 NOTE — Progress Notes (Signed)
Inpatient Diabetes Program Recommendations  AACE/ADA: New Consensus Statement on Inpatient Glycemic Control (2015)  Target Ranges:  Prepandial:   less than 140 mg/dL      Peak postprandial:   less than 180 mg/dL (1-2 hours)      Critically ill patients:  140 - 180 mg/dL    Latest Reference Range & Units 01/22/22 23:32 01/23/22 03:48 01/23/22 07:27 01/23/22 11:37 01/23/22 16:22 01/23/22 19:16 01/23/22 19:57  Glucose-Capillary 70 - 99 mg/dL 221 (H) 205 (H) 263 (H) 273 (H) 269 (H) 228 (H) 253 (H)    Latest Reference Range & Units 01/23/22 23:59 01/24/22 04:25 01/24/22 07:09  Glucose-Capillary 70 - 99 mg/dL 275 (H) 272 (H) 252 (H)  (H): Data is abnormally high    Home DM Meds: Humalog 14 units TID with meals (PCP visit 07/29/2021)                              Lantus 80 units Daily (PCP visit 07/29/2021)                              Ozempic     Current Orders: Novolog Resistant Correction Scale/ SSI (0-20 units) Q4 hours      Semglee 8 units BID     MD- Note patient supposed to be taking Lantus and Humalog insulins at home.  Currently on Vent getting Tube Feeds at 30cc/hr (goal rate 55cc/hr).  Started Solucortef 100 mg Q8 hours.   Note Semglee started last PM  Please consider:  1. Increase Semglee to 17 units BID (0.3 units/kg--split dose)  2. Once Tube Feeds are at goal rate, please also start Novolog Tube Feed coverage: Novolog 4 units Q4 hours HOLD if tube feeds HELD for any reason    --Will follow patient during hospitalization--  Wyn Quaker RN, MSN, CDE Diabetes Coordinator Inpatient Glycemic Control Team Team Pager: [phone] (8a-5p)

## 2022-01-24 NOTE — Progress Notes (Addendum)
GOALS OF CARE DISCUSSION  The Clinical status was relayed to family in detail. Husband [name] and Family at bedside  Updated and notified of patients medical condition.   Patient remains unresponsive and will not open eyes to command.   Patient is having a weak cough and struggling to remove secretions.   Patient with increased WOB and using accessory muscles to breathe Explained to family course of therapy and the modalities    Patient with Progressive multiorgan failure with a very high probablity of a very minimal chance of meaningful recovery despite all aggressive and optimal medical therapy.    Family understands the situation. Discussed prognosis, severe COMA, viral illness leading up to multiorgan failure  Discussed DNR status, the family has agreed and consented to DNR status   Family are satisfied with Plan of action and management. All questions answered  Additional CC time 35 mins   Regina Jackson, M.D.  [facility]  Medical Director [place] Aguza Director [facility] Cardio-Pulmonary Department

## 2022-01-24 NOTE — Procedures (Signed)
Routine EEG Report  Regina Jackson is a 66 y.o. female with a history of severe encephalopathy who is undergoing an EEG to evaluate for seizures.  Report: This EEG was acquired with electrodes placed according to the International 10-20 electrode system (including Fp1, Fp2, F3, F4, C3, C4, P3, P4, O1, O2, T3, T4, T5, T6, A1, A2, Fz, Cz, Pz). The following electrodes were missing or displaced: none.  There was no clear waking rhythm and no sleep architecture. Best background was generalized 5-7 Hz theta slowing with intermittent 2-3 Hz delta slowing. There was no focal slowing. There were no interictal epileptiform discharges. There were no electrographic seizures identified. Photic stimulation and hyperventilation were not performed.   Impression and clinical correlation: This EEG was obtained while sedated on fentanyl and comatose and was abnormal due to moderate-to-severe diffuse slowing. Epileptiform abnormalities were not seen during this recording.  Su Monks, MD [facility] [phone]  If 7pm- 7am, please page neurology on call as listed in [place].

## 2022-01-24 NOTE — Progress Notes (Signed)
[facility], [place] 01/24/22  Subjective:   LOS: 7  Patient remains critically ill. Still on the ventilator.  FiO2 titrated down to 40%. Underwent hemodialysis treatment yesterday.  Objective:  Vital signs in last 24 hours:  Temp:  [98.8 F (37.1 C)-100.1 F (37.8 C)] 98.8 F (37.1 C) (01/31 0700) Pulse Rate:  [91-106] 97 (01/31 0700) Resp:  [15-24] 20 (01/31 0700) BP: (80-145)/(33-63) 132/58 (01/31 0700) SpO2:  [77 %-99 %] 98 % (01/31 0700) FiO2 (%):  [60 %-70 %] 60 % (01/31 0700) Weight:  [119.4 kg-121 kg] 119.4 kg (01/30 1935)  Weight change:  Filed Weights   01/20/22 1752 01/23/22 1531 01/23/22 1935  Weight: 114.4 kg 121 kg 119.4 kg    Intake/Output:    Intake/Output Summary (Last 24 hours) at 01/24/2022 0753 Last data filed at 01/24/2022 0700 Gross per 24 hour  Intake 2573.77 ml  Output 1560 ml  Net 1013.77 ml      Physical Exam: General: Critically ill-appearing  HEENT Endotracheal tube in place  Pulm/lungs Scattered rhonchi, vent assisted  CVS/Heart S1S2 no rub  Abdomen:  Soft, nontender  Extremities: Trace lower extremity edema  Neurologic: Intubated, not following commands  Skin: Warm, dry  Access: Left arm AV fistula       Basic Metabolic Panel:  Recent Labs  Lab 01/20/22 1058 01/21/22 0530 01/21/22 0537 01/22/22 0340 01/23/22 0407 01/24/22 0427  NA 140 137  --  132* 135 130*  K 5.3* 3.6  --  3.6 3.7 3.5  CL 112* 105  --  101 99 93*  CO2 16* 24  --  20* 22 26  GLUCOSE 233* 154*  --  230* 235* 289*  BUN 63* 33*  --  58* 86* 58*  CREATININE 7.37* 4.95*  --  6.22* 7.47* 5.14*  CALCIUM 8.8* 8.5*  --  8.8* 8.8* 8.7*  MG 2.6*  --  1.8 1.8 1.7 1.7  PHOS  --  4.5  --  6.3* 7.1* 6.0*      CBC: Recent Labs  Lab 01/12/2022 1857 01/19/22 0918 01/20/22 0417 01/20/22 1058 01/21/22 0537 01/22/22 0340 01/23/22 0407 01/24/22 0427  WBC 11.3* 14.0* 17.7* 17.7* 15.1* 17.8* 20.2* 24.0*  NEUTROABS 8.9* 12.1*  15.3*  --   --   --   --   --   HGB 10.9* 10.2* 9.9* 9.1* 8.5* 8.3* 8.6* 8.2*  HCT 33.7* 31.6* 31.5* 29.3* 27.7* 27.1* 27.6* 25.4*  MCV 92.1 91.1 92.6 95.1 96.2 96.4 97.5 94.8  PLT 281 260 285 243 216 170 183 196       Lab Results  Component Value Date   HEPBSAG NON REACTIVE 01/20/2022   HEPBSAB Reactive (A) 01/20/2022      Microbiology:  Recent Results (from the past 240 hour(s))  Resp Panel by RT-PCR (Flu A&B, Covid) Nasopharyngeal Swab     Status: None   Collection Time: 01/14/2022  8:30 PM   Specimen: Nasopharyngeal Swab; Nasopharyngeal(NP) swabs in vial transport medium  Result Value Ref Range Status   SARS Coronavirus 2 by RT PCR NEGATIVE NEGATIVE Final    Comment: (NOTE) SARS-CoV-2 target nucleic acids are NOT DETECTED.  The SARS-CoV-2 RNA is generally detectable in upper respiratory specimens during the acute phase of infection. The lowest concentration of SARS-CoV-2 viral copies this assay can detect is 138 copies/mL. A negative result does not preclude SARS-Cov-2 infection and should not be used as the sole basis for treatment or other patient management decisions. A negative  result may occur with  improper specimen collection/handling, submission of specimen other than nasopharyngeal swab, presence of viral mutation(s) within the areas targeted by this assay, and inadequate number of viral copies(<138 copies/mL). A negative result must be combined with clinical observations, patient history, and epidemiological information. The expected result is Negative.  Fact Sheet for Patients:  EntrepreneurPulse.com.au  Fact Sheet for Healthcare Providers:  IncredibleEmployment.be  This test is no t yet approved or cleared by the [place] FDA and  has been authorized for detection and/or diagnosis of SARS-CoV-2 by FDA under an Emergency Use Authorization (EUA). This EUA will remain  in effect (meaning this test can be used) for  the duration of the COVID-19 declaration under Section 564(b)(1) of the Act, 21 U.S.C.section 360bbb-3(b)(1), unless the authorization is terminated  or revoked sooner.       Influenza A by PCR NEGATIVE NEGATIVE Final   Influenza B by PCR NEGATIVE NEGATIVE Final    Comment: (NOTE) The Xpert Xpress SARS-CoV-2/FLU/RSV plus assay is intended as an aid in the diagnosis of influenza from Nasopharyngeal swab specimens and should not be used as a sole basis for treatment. Nasal washings and aspirates are unacceptable for Xpert Xpress SARS-CoV-2/FLU/RSV testing.  Fact Sheet for Patients: EntrepreneurPulse.com.au  Fact Sheet for Healthcare Providers: IncredibleEmployment.be  This test is not yet approved or cleared by the [place] FDA and has been authorized for detection and/or diagnosis of SARS-CoV-2 by FDA under an Emergency Use Authorization (EUA). This EUA will remain in effect (meaning this test can be used) for the duration of the COVID-19 declaration under Section 564(b)(1) of the Act, 21 U.S.C. section 360bbb-3(b)(1), unless the authorization is terminated or revoked.  Performed at [facility], [address] [place], [place] [address]   MRSA Next Gen by PCR, Nasal     Status: None   Collection Time: 01/20/22  8:50 AM   Specimen: Nasal Mucosa; Nasal Swab  Result Value Ref Range Status   MRSA by PCR Next Gen NOT DETECTED NOT DETECTED Final    Comment: (NOTE) The GeneXpert MRSA Assay (FDA approved for NASAL specimens only), is one component of a comprehensive MRSA colonization surveillance program. It is not intended to diagnose MRSA infection nor to guide or monitor treatment for MRSA infections. Test performance is not FDA approved in patients less than 80 years old. Performed at [facility], [address] [place], [place] [address]   Culture, Respiratory w Gram Stain     Status: None   Collection Time:  01/21/22  2:46 AM   Specimen: Tracheal Aspirate; Respiratory  Result Value Ref Range Status   Specimen Description   Final    TRACHEAL ASPIRATE Performed at [facility], [address] [place], [place] [address]    Special Requests   Final    NONE Performed at [facility], [address] [place], [place] [address]    Gram Stain   Final    ABUNDANT WBC PRESENT, PREDOMINANTLY PMN ABUNDANT GRAM POSITIVE COCCI IN CLUSTERS Performed at [facility], [place] [address] [place], [place] [address]    Culture ABUNDANT STAPHYLOCOCCUS AUREUS  Final   Report Status 01/23/2022 FINAL  Final   Organism ID, Bacteria STAPHYLOCOCCUS AUREUS  Final      Susceptibility   Staphylococcus aureus - MIC*    CIPROFLOXACIN <=0.5 SENSITIVE Sensitive     ERYTHROMYCIN <=0.25 SENSITIVE Sensitive     GENTAMICIN <=0.5 SENSITIVE Sensitive     OXACILLIN <=  0.25 SENSITIVE Sensitive     TETRACYCLINE <=1 SENSITIVE Sensitive     VANCOMYCIN <=0.5 SENSITIVE Sensitive     TRIMETH/SULFA <=10 SENSITIVE Sensitive     CLINDAMYCIN <=0.25 SENSITIVE Sensitive     RIFAMPIN <=0.5 SENSITIVE Sensitive     Inducible Clindamycin NEGATIVE Sensitive     * ABUNDANT STAPHYLOCOCCUS AUREUS    Coagulation Studies: No results for input(s): LABPROT, INR in the last 72 hours.  Urinalysis: No results for input(s): COLORURINE, LABSPEC, PHURINE, GLUCOSEU, HGBUR, BILIRUBINUR, KETONESUR, PROTEINUR, UROBILINOGEN, NITRITE, LEUKOCYTESUR in the last 72 hours.  Invalid input(s): APPERANCEUR     Imaging: MR ANGIO HEAD WO CONTRAST  Result Date: 01/24/2022 CLINICAL DATA:  Neuro deficit, stroke suspected EXAM: MRA HEAD WITHOUT CONTRAST TECHNIQUE: Angiographic images of the Circle of Willis were acquired using MRA technique without intravenous contrast. COMPARISON:  No prior MRI, correlation is made with MRI 01/21/2022 FINDINGS: Anterior circulation: Both internal carotid arteries are patent to the termini, without stenosis  or other abnormality. A1 segments patent. Normal anterior communicating artery. Anterior cerebral arteries are patent to their distal aspects. No M1 stenosis or occlusion. Normal MCA bifurcations. Distal MCA branches perfused and symmetric. Posterior circulation: Right dominant. Diminutive left V4 segment. Vertebral arteries patent to the vertebrobasilar junction without stenosis. Apparent discontinuity in the proximal V4 segments is favored to be secondary to motion artifact. Posterior inferior cerebral arteries patent bilaterally. Basilar patent to its distal aspect. Superior cerebellar arteries patent bilaterally. Bilateral P1 segments originate from the basilar artery. PCAs perfused to their distal aspects without stenosis. Bilateral posterior communicating arteries are visualized. Anatomic variants: None significant Other: None. IMPRESSION: No intracranial large vessel occlusion or significant stenosis. Electronically Signed   By: Merilyn Baba M.D.   On: 01/24/2022 01:10   DG Chest Port 1 View  Result Date: 01/23/2022 CLINICAL DATA:  Hypoxia, intubated EXAM: PORTABLE CHEST 1 VIEW COMPARISON:  01/21/2022 FINDINGS: Endotracheal tube at the thoracic inlet, 8 cm above the carina. Enteric tube courses into the mid stomach. Cardiomegaly with pulmonary vascular congestion. No frank interstitial edema. Small left pleural effusion. No pneumothorax. Left IJ venous catheter terminates in the proximal SVC. IMPRESSION: Endotracheal tube at the thoracic inlet, 8 cm above the carina. Additional support apparatus as above. Pulmonary vascular congestion without frank interstitial edema. Small left pleural effusion. Electronically Signed   By: Julian Hy M.D.   On: 01/23/2022 03:10   ECHOCARDIOGRAM COMPLETE  Result Date: 01/22/2022    ECHOCARDIOGRAM REPORT   Patient Name:   Regina Jackson Date of Exam: 01/22/2022 Medical Rec #:  [id number]      Height:       67.5 in Accession #:    [id number]     Weight:        252.2 lb Date of Birth:  February 15, 1956      BSA:          2.244 m Patient Age:    22 years       BP:           122/61 mmHg Patient Gender: F              HR:           102 bpm. Exam Location:  [facility] Procedure: 2D Echo and Intracardiac Opacification Agent Indications:     Stroke  History:         Patient has no prior history of Echocardiogram examinations.  Risk Factors:ERSD.  Sonographer:     L Thornton-Maynard Referring Phys:  [id number] [place] L RUST-CHESTER Diagnosing Phys: Neoma Laming  Sonographer Comments: Echo performed with patient supine and on artificial respirator and patient is morbidly obese. Image acquisition challenging due to patient body habitus. IMPRESSIONS  1. Left ventricular ejection fraction, by estimation, is 60 to 65%. The left ventricle has normal function. The left ventricle has no regional wall motion abnormalities. Left ventricular diastolic parameters are consistent with Grade III diastolic dysfunction (restrictive).  2. Right ventricular systolic function is normal. The right ventricular size is normal.  3. Left atrial size was mild to moderately dilated.  4. Right atrial size was mild to moderately dilated.  5. The mitral valve is normal in structure. Trivial mitral valve regurgitation. No evidence of mitral stenosis.  6. The aortic valve is normal in structure. Aortic valve regurgitation is not visualized. Aortic valve sclerosis is present, with no evidence of aortic valve stenosis.  7. The inferior vena cava is normal in size with greater than 50% respiratory variability, suggesting right atrial pressure of 3 mmHg. FINDINGS  Left Ventricle: Left ventricular ejection fraction, by estimation, is 60 to 65%. The left ventricle has normal function. The left ventricle has no regional wall motion abnormalities. Definity contrast agent was given IV to delineate the left ventricular  endocardial borders. The left ventricular internal cavity size was normal in size. There is no left  ventricular hypertrophy. Left ventricular diastolic parameters are consistent with Grade III diastolic dysfunction (restrictive). Right Ventricle: The right ventricular size is normal. No increase in right ventricular wall thickness. Right ventricular systolic function is normal. Left Atrium: Left atrial size was mild to moderately dilated. Right Atrium: Right atrial size was mild to moderately dilated. Pericardium: There is no evidence of pericardial effusion. Mitral Valve: The mitral valve is normal in structure. Trivial mitral valve regurgitation. No evidence of mitral valve stenosis. MV peak gradient, 5.2 mmHg. The mean mitral valve gradient is 3.0 mmHg. Tricuspid Valve: The tricuspid valve is normal in structure. Tricuspid valve regurgitation is trivial. No evidence of tricuspid stenosis. Aortic Valve: The aortic valve is normal in structure. Aortic valve regurgitation is not visualized. Aortic valve sclerosis is present, with no evidence of aortic valve stenosis. Aortic valve mean gradient measures 11.0 mmHg. Aortic valve peak gradient measures 18.6 mmHg. Aortic valve area, by VTI measures 1.95 cm. Pulmonic Valve: The pulmonic valve was normal in structure. Pulmonic valve regurgitation is not visualized. No evidence of pulmonic stenosis. Aorta: The aortic root is normal in size and structure. Venous: The inferior vena cava is normal in size with greater than 50% respiratory variability, suggesting right atrial pressure of 3 mmHg. IAS/Shunts: No atrial level shunt detected by color flow Doppler.  LEFT VENTRICLE PLAX 2D LVIDd:         4.00 cm      Diastology LVIDs:         3.00 cm      LV e' medial:    5.11 cm/s LV PW:         1.70 cm      LV E/e' medial:  17.1 LV IVS:        2.10 cm      LV e' lateral:   5.87 cm/s LVOT diam:     2.30 cm      LV E/e' lateral: 14.9 LV SV:         57 LV SV Index:   25 LVOT Area:  4.15 cm  LV Volumes (MOD) LV vol d, MOD A2C: 130.0 ml LV vol d, MOD A4C: 79.1 ml LV vol s, MOD  A2C: 24.4 ml LV vol s, MOD A4C: 16.0 ml LV SV MOD A2C:     105.6 ml LV SV MOD A4C:     79.1 ml LV SV MOD BP:      89.8 ml RIGHT VENTRICLE RV S prime:     17.90 cm/s TAPSE (M-mode): 1.8 cm LEFT ATRIUM             Index        RIGHT ATRIUM           Index LA diam:        2.80 cm 1.25 cm/m   RA Area:     13.40 cm LA Vol (A2C):   32.3 ml 14.39 ml/m  RA Volume:   31.40 ml  13.99 ml/m LA Vol (A4C):   39.9 ml 17.78 ml/m LA Biplane Vol: 37.6 ml 16.76 ml/m  AORTIC VALVE                     PULMONIC VALVE AV Area (Vmax):    1.64 cm      PV Vmax:       0.80 m/s AV Area (Vmean):   1.63 cm      PV Peak grad:  2.6 mmHg AV Area (VTI):     1.95 cm AV Vmax:           215.50 cm/s AV Vmean:          152.000 cm/s AV VTI:            0.290 m AV Peak Grad:      18.6 mmHg AV Mean Grad:      11.0 mmHg LVOT Vmax:         84.90 cm/s LVOT Vmean:        59.700 cm/s LVOT VTI:          0.136 m LVOT/AV VTI ratio: 0.47  AORTA Ao Root diam: 3.70 cm MITRAL VALVE MV Area (PHT): 4.68 cm    SHUNTS MV Area VTI:   2.08 cm    Systemic VTI:  0.14 m MV Peak grad:  5.2 mmHg    Systemic Diam: 2.30 cm MV Mean grad:  3.0 mmHg MV Vmax:       1.14 m/s MV Vmean:      86.1 cm/s MV Decel Time: 162 msec MV E velocity: 87.40 cm/s MV A velocity: 96.00 cm/s MV E/A ratio:  0.91 Shaukat Khan Electronically signed by Neoma Laming Signature Date/Time: 01/22/2022/12:00:53 PM    Final      Medications:    sodium chloride     feeding supplement (VITAL AF 1.2 CAL) 40 mL/hr at 01/24/22 0720   fentaNYL infusion INTRAVENOUS 50 mcg/hr (01/24/22 0700)   norepinephrine (LEVOPHED) Adult infusion 5 mcg/min (01/24/22 0741)   propofol (DIPRIVAN) infusion Stopped (01/23/22 1054)   thiamine injection Stopped (01/23/22 1012)   vancomycin Stopped (01/23/22 1905)   vasopressin Stopped (01/23/22 2241)    albuterol  2.5 mg Nebulization Q6H   aspirin  81 mg Per Tube Daily   chlorhexidine gluconate (MEDLINE KIT)  15 mL Mouth Rinse BID   Chlorhexidine Gluconate Cloth  6  each Topical Q0600   docusate  100 mg Per Tube BID   epoetin (EPOGEN/PROCRIT) injection  4,000 Units Intravenous Q M,W,F-HD   feeding supplement (PROSource TF)  90 mL Per  Tube TID   free water  30 mL Per Tube Q4H   heparin  5,000 Units Subcutaneous Q8H   hydrocortisone sod succinate (SOLU-CORTEF) inj  100 mg Intravenous Q8H   insulin aspart  0-20 Units Subcutaneous Q4H   insulin glargine-yfgn  8 Units Subcutaneous BID   levothyroxine  100 mcg Intravenous Daily   mouth rinse  15 mL Mouth Rinse 10 times per day   multivitamin  1 tablet Per Tube QHS   pantoprazole (PROTONIX) IV  40 mg Intravenous Daily   polyethylene glycol  17 g Per Tube Daily   risperiDONE  0.5 mg Per Tube TID   rOPINIRole  0.25 mg Per Tube QHS   sodium chloride flush  10-40 mL Intracatheter Q12H   sodium chloride, acetaminophen **OR** acetaminophen, alteplase, fentaNYL (SUBLIMAZE) injection, fentaNYL (SUBLIMAZE) injection, heparin, lidocaine (PF), lidocaine-prilocaine, ondansetron **OR** ondansetron (ZOFRAN) IV, pentafluoroprop-tetrafluoroeth, sodium chloride flush, vancomycin  Assessment/ Plan:  66 y.o. female with medical problems of  End-stage renal disease, on dialysis since 2015, hypothyroidism, hyperlipidemia, diabetes, peripheral neuropathy, morbid obesity, mono, gammopathy, ischemic optic neuropathy, regarding oral thrush, COVID-19 in September 2022 history of endometrial carcinoma, obstructive sleep apnea was admitted on 01/10/2022 for  Principal Problem:   Acute metabolic encephalopathy Active Problems:   Chronic home hemodialysis status ([place])   Hypertensive urgency   Non-compliance with renal dialysis (HCC)   Anemia of chronic kidney failure, stage 5 (HCC)   Elevated troponin   Fluid overload   Involuntary commitment   Type 2 diabetes mellitus with kidney complication, with long-term current use of insulin (HCC)   Hypothyroidism   Endometrial cancer ([place])   H/O radioactive iodine thyroid ablation    Acute respiratory failure ([place])   Endotracheally intubated   On mechanically assisted ventilation (HCC)  Acute metabolic encephalopathy [P79.43]  #. ESRD, with hyperkalemia Patient underwent hemodialysis treatment yesterday.  Tolerated well.  Ultrafiltration achieved was 1.5 kg.  #. Anemia of CKD  Lab Results  Component Value Date   HGB 8.2 (L) 01/24/2022   Continue Epogen 4000 units IV with dialysis treatments.  Hemoglobin currently 8.2.  #. Secondary hyperparathyroidism of renal origin N 25.81      Component Value Date/Time   PTH 492 (H) 01/20/2022 1304   Lab Results  Component Value Date   PHOS 6.0 (H) 01/24/2022   Phosphorus down to 6.0.  Continue to monitor bone mineral metabolism parameters.   #. Diabetes type 2 with CKD Hgb A1c MFr Bld (%)  Date Value  01/18/2022 9.2 (H)  Plan as hospitalist team  #Severe hypothyroidism, myxedema coma Patient transitioned to PO synthroid.    LOS: 7 Caddie Randle 1/31/20237:53 AM  [place], [place]

## 2022-01-24 NOTE — Progress Notes (Signed)
Pt transported to MRI on the vent and returned to ICU 8 without incident. Pt remains on vent and is tol well.

## 2022-01-24 NOTE — Progress Notes (Signed)
Palliative:  HPI: 66 y.o. female  with past medical history of ESRD on home hemodialysis 3x weekly (still produces urine), hypothyroidism (s/p radioactive iodine thyroid ablation), anemia related to chronic renal disease, endometrial cancer (s/p surgery and chemo 2021 and no signs of recurrence), diabetes, hypertension admitted on 01/09/2022 with altered mental status and agitation with noted non-adherence to dialysis for 8 days per family (unclear if she was taking medication). Patient is notably under IVC. 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   I met today with Dr. Mortimer Fries, husband Regina Jackson, brother, and sister-in-law at bedside. Good discussion regarding Regina Jackson's overall condition and concern for neurological status that could effect her overall prognosis. Discussed the "what ifs" and recommendation for DNR if she were to decline further despite aggressive support (family later agree with DNR status). They understand that she continues to be critically ill. They agree with plan to continue current level of care with hopes of improvement. Discussion had regarding what to do if we do not see the improvement we hope for and if progression to trach/PEG would align with Regina Jackson's wishes and quality of life. Discussed some basic signs we would expect to see that could indicate neurological improvement. Family express that she likes to be in control and would not want to live in a condition that she is currently. They plan to have further discussions and add in her adult children to the conversation as well.    All questions/concerns addressed. Emotional support provided.   Exam: Unresponsive off sedation. No distress. Cough reflex intact. No movement, no eye opening, no following commands. Tolerating vent support 40% FiO2 (sedation required overnight due to vent dyssynchrony). Abd soft. Extremities warm. No movement.   Plan: - Notified that family has  decided on DNR status.  - Will follow and continue to support and aide in decisions based on progression.   [place], NP Palliative Medicine Team Pager [phone] (Please see [email or web address] for schedule) Team Phone [phone]    Greater than 50%  of this time was spent counseling and coordinating care related to the above assessment and plan

## 2022-01-24 NOTE — Progress Notes (Signed)
NAME:  Regina Jackson, MRN:  [id number], DOB:  [birth year], LOS: 7 ADMISSION DATE:  12/25/2021, CONSULTATION DATE:  01/20/22 REFERRING MD:  Dr. Mal Misty, CHIEF COMPLAINT:  AMS, Acute Respiratory Distress   Brief Pt Description / Synopsis:  66 y.o. Female with PMH significant for ESRD on HD, admitted with Acute Metabolic Encephalopathy due to multiple severe metabolic derangements in setting of missed HD sessions and Hypothyroidism (query ? Myxedema coma).  Developed acute respiratory distress requiring intubation and mechanical ventilation.  History of Present Illness:  Regina Jackson is a 67 year old female with a past medical history significant for ESRD on home hemodialysis with noncompliance at times, hypothyroidism, anemia of chronic disease, endometrial cancer, hypertension, diabetes mellitus who presented to [facility] ED on 01/13/2022 due to altered mental status and increasing agitation.  Patient had no chest pain, shortness of breath, abdominal pain, nausea, vomiting, diarrhea.  The patient's husband reported she had not been compliant with her home hemodialysis in nearly 8 days, also unsure if she has been taking her thyroid medication.  ED Course: Initial vital signs: Temperature 97.7 orally, respiratory 15, pulse 96, blood pressure 199/163 Significant labs: Sodium 134, bicarbonate 16, glucose 211, BUN 49, creatinine 5.9, alkaline phosphatase 132, BNP 329, high-sensitivity troponin 42, WBC 11.3, hemoglobin 10.9, TSH 58.8 Venous blood gas: pH 7.3/PCO2 33/PO2 54/bicarbonate 16.2 COVID-19 PCR negative Imaging: Chest x-ray:Mild cardiomegaly with vascular congestion. Possible small left effusion. No consolidation or pneumothorax CT head:IMPRESSION: 1. No acute intracranial abnormality. 2. Mild chronic small vessel ischemia.   She was placed under IVC, and the hospitalist were asked to admit.  Nephrology was consulted for dialysis during her stay.  However given her agitation and confusion, dialysis  was unable to's be performed safely therefore he was deferred for reevaluation in the next day.   Pertinent  Medical History  ESRD on hemodialysis Noncompliance with dialysis Anemia of chronic disease Endometrial cancer Hypothyroidism Hypertension diabetes mellitus  Micro Data:  1/24: SARS-CoV-2 and influenza PCR>> negative 1/25: HIV screen>> nonreactive 1/27: MRSA PCR>> negative 1/27: Tracheal aspirate>>abundant GPC, Staph aureus, sensitivities pending  Antimicrobials:  1/29 vancomycin>>  Significant Hospital Events: Including procedures, antibiotic start and stop dates in addition to other pertinent events   1/24: Admitted by the hospitalist for altered mental status and hypertensive urgency.  Nephrology consulted 1/25: Unable to safely perform dialysis due to severe agitation and confusion.  Plan to reassess HD tomorrow 1/26: Received IV Haldol and Valium without improvement in mental status.  Psychiatry consulted 1/27: Required emergent intubation due to AMS and Acute Respiratory Distress.  MRI and EEG pending, Neuro consulted.  Plan for HD now that pt is sedated 1/28 remains on vent,poorly responsive on SAT, MRI today 1/29 copious ETT secretions, started Levaquin (allergies to ampicillin/Unasyn), cardiogram done, pending 1/30 MRI +for acute CVA, severe resp failure 1/31 remains on vent and pressors  Interim History / Subjective:  Acute CVA on MRI 1/28 punctate +Staph pneumonia Remains critically ill Prognosis is very poor Remains on vent and pressors +septic shock    Objective   Blood pressure (!) 132/58, pulse 97, temperature 98.8 F (37.1 C), temperature source Axillary, resp. rate 20, height 5' 7.52" (1.715 m), weight 119.4 kg, SpO2 98 %. CVP:  [2 mmHg-9 mmHg] 3 mmHg  Vent Mode: PRVC FiO2 (%):  [60 %-70 %] 60 % Set Rate:  [20 bmp] 20 bmp Vt Set:  [500 mL] 500 mL PEEP:  [8 cmH20] 8 cmH20 Plateau Pressure:  [21 cmH20] 21 cmH20  Intake/Output Summary (Last 24  hours) at 01/24/2022 0754 Last data filed at 01/24/2022 0700 Gross per 24 hour  Intake 2573.77 ml  Output 1560 ml  Net 1013.77 ml    Filed Weights   01/20/22 1752 01/23/22 1531 01/23/22 1935  Weight: 114.4 kg 121 kg 119.4 kg    REVIEW OF SYSTEMS  PATIENT IS UNABLE TO PROVIDE COMPLETE REVIEW OF SYSTEMS DUE TO SEVERE CRITICAL ILLNESS AND TOXIC METABOLIC ENCEPHALOPATHY    PHYSICAL EXAMINATION:  GENERAL:critically ill appearing, +resp distress EYES: Pupils equal, round, reactive to light.  No scleral icterus.  MOUTH: Moist mucosal membrane. INTUBATED NECK: Supple.  PULMONARY: +rhonchi, +wheezing CARDIOVASCULAR: S1 and S2.  No murmurs  GASTROINTESTINAL: Soft, nontender, -distended. Positive bowel sounds.  MUSCULOSKELETAL: No swelling, clubbing, or edema.  NEUROLOGIC: obtunded SKIN:intact,warm,dry     Assessment & Plan:   Acute Hypoxic Respiratory Failure in the setting of severe metabolic derangements Inability to protect airway with acute CVA, STAPH PNEUMONIA  Severe ACUTE Hypoxic and Hypercapnic Respiratory Failure -continue Mechanical Ventilator support -Wean Fio2 and PEEP as tolerated -VAP/VENT bundle implementation - Wean PEEP & FiO2 as tolerated, maintain SpO2 > 88% - Head of bed elevated 30 degrees, VAP protocol in place - Plateau pressures less than 30 cm H20  - Intermittent chest x-ray & ABG PRN - Ensure adequate pulmonary hygiene  -will perform SAT/SBT when respiratory parameters are met   SEPTIC shock SOURCE-pneumonia -use vasopressors to keep MAP>65 as needed -follow ABG and LA as needed +Vancomycin    NEUROLOGY Acute Metabolic Encephalopathy in setting of multiple severe metabolic derangements (AKI and uremia, Hypothyroidism with ? Myxedema coma Multiple punctate acute to subacute infarcts Sedation needs in the setting of mechanical ventilation -Maintain a RASS goal of 0 to -1 -Fentanyl and Propofol to maintain RASS goal -Avoid sedating  medications as able -Daily wake up assessment Neurology consult- appreciate input -Correction of multiple metabolic derangements as above -Thiamine IV -Synthroid IV   RENAL ESRD on Hemodialysis Mild Hyperkalemia Metabolic Acidosis -Monitor I&O's / urinary output -Follow BMP -Ensure adequate renal perfusion -Avoid nephrotoxic agents as able -Replace electrolytes as indicated -Nephrology following, appreciate input -HD as per Nephrology   ENDO - ICU hypoglycemic\Hyperglycemia protocol -check FSBS per protocol Diabetes Mellitus Hypothyroidism, query ? Myxedema Coma PMHx: Radioactive Iodine Thyroid ablation -CBG's q4h; Target range of 140 to 180 -SSI -Follow ICU Hypo/Hyperglycemia protocol -Continue IV Synthroid 100 mg daily (received 200 mg x1 earlier in stay) -Follow TSH and thyroid panel   GI GI PROPHYLAXIS as indicated  NUTRITIONAL STATUS DIET-->TF's as tolerated Constipation protocol as indicated   ELECTROLYTES -follow labs as needed -replace as needed -pharmacy consultation and following      Best Practice (right click and "Reselect all SmartList Selections" daily)   Diet/type: NPO DVT prophylaxis: prophylactic heparin  GI prophylaxis: PPI Lines: Central line and yes and it is still needed Foley:  Yes, and it is still needed Code Status:  full code Last date of multidisciplinary goals of care discussion [1/27]    Labs   CBC: Recent Labs  Lab 01/22/2022 1857 01/19/22 0918 01/20/22 0417 01/20/22 1058 01/21/22 0537 01/22/22 0340 01/23/22 0407 01/24/22 0427  WBC 11.3* 14.0* 17.7* 17.7* 15.1* 17.8* 20.2* 24.0*  NEUTROABS 8.9* 12.1* 15.3*  --   --   --   --   --   HGB 10.9* 10.2* 9.9* 9.1* 8.5* 8.3* 8.6* 8.2*  HCT 33.7* 31.6* 31.5* 29.3* 27.7* 27.1* 27.6* 25.4*  MCV 92.1 91.1 92.6 95.1 96.2  96.4 97.5 94.8  PLT 281 260 285 243 216 170 183 196     Basic Metabolic Panel: Recent Labs  Lab 01/20/22 1058 01/21/22 0530 01/21/22 0537  01/22/22 0340 01/23/22 0407 01/24/22 0427  NA 140 137  --  132* 135 130*  K 5.3* 3.6  --  3.6 3.7 3.5  CL 112* 105  --  101 99 93*  CO2 16* 24  --  20* 22 26  GLUCOSE 233* 154*  --  230* 235* 289*  BUN 63* 33*  --  58* 86* 58*  CREATININE 7.37* 4.95*  --  6.22* 7.47* 5.14*  CALCIUM 8.8* 8.5*  --  8.8* 8.8* 8.7*  MG 2.6*  --  1.8 1.8 1.7 1.7  PHOS  --  4.5  --  6.3* 7.1* 6.0*    GFR: Estimated Creatinine Clearance: 15.1 mL/min (A) (by C-G formula based on SCr of 5.14 mg/dL (H)). Recent Labs  Lab 01/20/22 2252 01/21/22 0537 01/22/22 0340 01/23/22 0407 01/24/22 0427  PROCALCITON 0.56 0.80 1.86  --   --   WBC  --  15.1* 17.8* 20.2* 24.0*     Liver Function Tests: Recent Labs  Lab 01/14/2022 1857 01/19/22 0918 01/20/22 0417 01/20/22 1058 01/21/22 0530 01/22/22 0340 01/23/22 0407  AST 39 50* 79* 67*  --   --   --   ALT 22 27 39 37  --   --   --   ALKPHOS 132* 119 126 114  --   --   --   BILITOT 0.8 0.8 0.8 0.7  --   --   --   PROT 7.6 7.1 7.4 6.7  --   --   --   ALBUMIN 3.5 3.3* 3.6 3.0* 2.9* 2.6* 2.3*    No results for input(s): LIPASE, AMYLASE in the last 168 hours. Recent Labs  Lab 01/19/22 0918  AMMONIA 16     ABG    Component Value Date/Time   PHART 7.18 (LL) 01/23/2022 0318   PCO2ART 43 01/23/2022 0318   PO2ART 84 01/23/2022 0318   HCO3 16.0 (L) 01/23/2022 0318   ACIDBASEDEF 11.7 (H) 01/23/2022 0318   O2SAT 93.1 01/23/2022 0318      Coagulation Profile: No results for input(s): INR, PROTIME in the last 168 hours.  Cardiac Enzymes: No results for input(s): CKTOTAL, CKMB, CKMBINDEX, TROPONINI in the last 168 hours.  HbA1C: Hgb A1c MFr Bld  Date/Time Value Ref Range Status  01/18/2022 06:25 AM 9.2 (H) 4.8 - 5.6 % Final    Comment:    (NOTE)         Prediabetes: 5.7 - 6.4         Diabetes: >6.4         Glycemic control for adults with diabetes: <7.0     CBG: Recent Labs  Lab 01/23/22 1916 01/23/22 1957 01/23/22 2359 01/24/22 0425  01/24/22 0709  GLUCAP 228* 253* 275* 272* 252*    MRI brain 1/28: IMPRESSION: 1. 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 No associated hemorrhage or mass effect. 2. Mild chronic white matter microangiopathy.  2D echo performed today, interpretation pending.   Review of Systems:   Unable to assess due to AMS/mechanically ventilated status  Allergies Allergies  Allergen Reactions   Amoxicillin    Augmentin [Amoxicillin-Pot Clavulanate]    Ceftin [Cefuroxime]    Compazine [Prochlorperazine]    Demerol [Meperidine Hcl]  Macrobid [Nitrofurantoin]    Phenergan [Promethazine]    Sulfa Antibiotics      Medications   Scheduled Meds:  albuterol  2.5 mg Nebulization Q6H   aspirin  81 mg Per Tube Daily   chlorhexidine gluconate (MEDLINE KIT)  15 mL Mouth Rinse BID   Chlorhexidine Gluconate Cloth  6 each Topical Q0600   docusate  100 mg Per Tube BID   epoetin (EPOGEN/PROCRIT) injection  4,000 Units Intravenous Q M,W,F-HD   feeding supplement (PROSource TF)  90 mL Per Tube TID   free water  30 mL Per Tube Q4H   heparin  5,000 Units Subcutaneous Q8H   hydrocortisone sod succinate (SOLU-CORTEF) inj  100 mg Intravenous Q8H   insulin aspart  0-20 Units Subcutaneous Q4H   insulin glargine-yfgn  8 Units Subcutaneous BID   levothyroxine  100 mcg Intravenous Daily   mouth rinse  15 mL Mouth Rinse 10 times per day   multivitamin  1 tablet Per Tube QHS   pantoprazole (PROTONIX) IV  40 mg Intravenous Daily   polyethylene glycol  17 g Per Tube Daily   risperiDONE  0.5 mg Per Tube TID   rOPINIRole  0.25 mg Per Tube QHS   sodium chloride flush  10-40 mL Intracatheter Q12H   Continuous Infusions:  sodium chloride     feeding supplement (VITAL AF 1.2 CAL) 40 mL/hr at 01/24/22 0720   fentaNYL infusion INTRAVENOUS 50 mcg/hr (01/24/22 0700)   norepinephrine (LEVOPHED) Adult infusion  5 mcg/min (01/24/22 0741)   propofol (DIPRIVAN) infusion Stopped (01/23/22 1054)   thiamine injection Stopped (01/23/22 1012)   vancomycin Stopped (01/23/22 1905)   vasopressin Stopped (01/23/22 2241)   PRN Meds:.sodium chloride, acetaminophen **OR** acetaminophen, alteplase, fentaNYL (SUBLIMAZE) injection, fentaNYL (SUBLIMAZE) injection, heparin, lidocaine (PF), lidocaine-prilocaine, ondansetron **OR** ondansetron (ZOFRAN) IV, pentafluoroprop-tetrafluoroeth, sodium chloride flush, vancomycin    DVT/GI PRX  assessed I Assessed the need for Labs I Assessed the need for Foley I Assessed the need for Central Venous Line Family Discussion when available I Assessed the need for Mobilization I made an Assessment of medications to be adjusted accordingly Safety Risk assessment completed  CASE DISCUSSED IN MULTIDISCIPLINARY ROUNDS WITH ICU TEAM     Critical Care Time devoted to patient care services described in this note is 55 minutes.  Critical care was necessary to treat /prevent imminent and life-threatening deterioration. Overall, patient is critically ill, prognosis is guarded.  Patient with Multiorgan failure and at high risk for cardiac arrest and death.    Corrin Parker, M.D.  [facility]  Medical Director [place] Director [facility]io[facility] Department

## 2022-01-24 NOTE — Progress Notes (Signed)
Eeg done 

## 2022-01-24 NOTE — Progress Notes (Signed)
Subjective: Exam remains poor, even when sedation paused x30 min. Palliative care consulted; patient remains full code but family will have meeting to discuss DNR status.   Objective: Current vital signs: BP (!) 96/43    Pulse 84    Temp 98.6 F (37 C) (Axillary)    Resp 20    Ht 5' 7.52" (1.715 m)    Wt 119.4 kg    SpO2 97%    BMI 40.59 kg/m  Vital signs in last 24 hours: Temp:  [98.6 F (37 C)-100.1 F (37.8 C)] 98.6 F (37 C) (01/31 1100) Pulse Rate:  [83-102] 84 (01/31 1445) Resp:  [15-24] 20 (01/31 1445) BP: (80-145)/(30-60) 96/43 (01/31 1445) SpO2:  [77 %-98 %] 97 % (01/31 1445) FiO2 (%):  [40 %-60 %] 40 % (01/31 1350) Weight:  [119.4 kg] 119.4 kg (01/30 1935)  Intake/Output from previous day: 01/30 0701 - 01/31 0700 In: 2573.8 [I.V.:1291.2; NG/GT:1032.6; IV Piggyback:250] Out: 1884 [Urine:60] Intake/Output this shift: Total I/O In: 559.4 [I.V.:96.9; NG/GT:406.7; IV Piggyback:55.8] Out: 0  Nutritional status:  Diet Order             Diet NPO time specified  Diet effective now                    Physical Exam  HEENT-  [place]/AT. Neck is supple.   Lungs- Intubated and sedated    Extremities- Warm and well perfused    Neurological Examination Note: fentanyl paused x30 min prior to exam Mental Status: Does not respond to loud clapping or calling of her name in the sedated state. No purposeful movements. No posturing to noxious except for internal rotation of LLE and thigh and ankle to noxious and slight withdrawal of LUE by about 2 cm in response to noxious applied to either arm. No eye opening. Cranial Nerves: II: Pupils almost pinpoint, sluggishly reactive. No blink to threat.  III,IV, VI: Eyes exotropic near the midline without doll's eye reflex on sedation. V,VII: Weak corneals bilaterally   VIII: No response to voice IX,X: Intubated. Cough and gag intact.  XI: Head is midline.  XII: Intubated Motor/Sensory: Flaccid tone x 4 at rest.  No purposeful  movements. No posturing to noxious except for internal rotation of LLE and thigh and ankle to noxious and slight withdrawal of LUE by about 2 cm in response to noxious applied to either arm.  Deep Tendon Reflexes: Hypoactive in the context of sedation. Toes mute on the right and upgoing on the left  Cerebellar/Gait: Unable to assess  Lab Results: Results for orders placed or performed during the hospital encounter of 01/12/2022 (from the past 48 hour(s))  Glucose, capillary     Status: Abnormal   Collection Time: 01/22/22  7:30 PM  Result Value Ref Range   Glucose-Capillary 216 (H) 70 - 99 mg/dL    Comment: Glucose reference range applies only to samples taken after fasting for at least 8 hours.   Comment 1 Notify RN    Comment 2 Document in Chart   Glucose, capillary     Status: Abnormal   Collection Time: 01/22/22 11:32 PM  Result Value Ref Range   Glucose-Capillary 221 (H) 70 - 99 mg/dL    Comment: Glucose reference range applies only to samples taken after fasting for at least 8 hours.   Comment 1 Notify RN    Comment 2 Document in Chart   Blood gas, arterial     Status: Abnormal   Collection Time: 01/23/22  3:18 AM  Result Value Ref Range   FIO2 60.00    Mode PRESSURE REGULATED VOLUME CONTROL    VT 420 mL   Peep/cpap 8.0 cm H20   pH, Arterial 7.18 (LL) 7.350 - 7.450    Comment: CRITICAL RESULT, NOTIFIED PHYSICIAN [place] [id number] DT    pCO2 arterial 43 32.0 - 48.0 mmHg   pO2, Arterial 84 83.0 - 108.0 mmHg   Bicarbonate 16.0 (L) 20.0 - 28.0 mmol/L   Acid-base deficit 11.7 (H) 0.0 - 2.0 mmol/L   O2 Saturation 93.1 %   Patient temperature 37.0    Collection site RIGHT RADIAL    Sample type ARTERIAL DRAW    Allens test (pass/fail) PASS PASS   Mechanical Rate 20     Comment: Performed at [facility], [address] [place], [place] [address]  Glucose, capillary     Status: Abnormal   Collection Time: 01/23/22  3:48 AM  Result Value Ref Range    Glucose-Capillary 205 (H) 70 - 99 mg/dL    Comment: Glucose reference range applies only to samples taken after fasting for at least 8 hours.   Comment 1 Notify RN    Comment 2 Document in Chart   CBC     Status: Abnormal   Collection Time: 01/23/22  4:07 AM  Result Value Ref Range   WBC 20.2 (H) 4.0 - 10.5 K/uL   RBC 2.83 (L) 3.87 - 5.11 MIL/uL   Hemoglobin 8.6 (L) 12.0 - 15.0 g/dL   HCT 27.6 (L) 36.0 - 46.0 %   MCV 97.5 80.0 - 100.0 fL   MCH 30.4 26.0 - 34.0 pg   MCHC 31.2 30.0 - 36.0 g/dL   RDW 14.1 11.5 - 15.5 %   Platelets 183 150 - 400 K/uL   nRBC 0.3 (H) 0.0 - 0.2 %    Comment: Performed at [facility], [address] [place], [place] [address]  Renal function panel     Status: Abnormal   Collection Time: 01/23/22  4:07 AM  Result Value Ref Range   Sodium 135 135 - 145 mmol/L   Potassium 3.7 3.5 - 5.1 mmol/L   Chloride 99 98 - 111 mmol/L   CO2 22 22 - 32 mmol/L   Glucose, Bld 235 (H) 70 - 99 mg/dL    Comment: Glucose reference range applies only to samples taken after fasting for at least 8 hours.   BUN 86 (H) 8 - 23 mg/dL   Creatinine, Ser 7.47 (H) 0.44 - 1.00 mg/dL   Calcium 8.8 (L) 8.9 - 10.3 mg/dL   Phosphorus 7.1 (H) 2.5 - 4.6 mg/dL   Albumin 2.3 (L) 3.5 - 5.0 g/dL   GFR, Estimated 6 (L) >60 mL/min    Comment: (NOTE) Calculated using the CKD-EPI Creatinine Equation (2021)    Anion gap 14 5 - 15    Comment: Performed at [facility], [address] [place], [place] [address]  TSH     Status: Abnormal   Collection Time: 01/23/22  4:07 AM  Result Value Ref Range   TSH 9.843 (H) 0.350 - 4.500 uIU/mL    Comment: Performed by a 3rd Generation assay with a functional sensitivity of <=0.01 uIU/mL. Performed at [facility], [address] [place], [place] [address]   Magnesium     Status: None   Collection Time: 01/23/22  4:07 AM  Result Value Ref Range   Magnesium 1.7 1.7 - 2.4 mg/dL  Comment: Performed at [facility], [address] [place], [place] [address]  Glucose, capillary     Status: Abnormal   Collection Time: 01/23/22  7:27 AM  Result Value Ref Range   Glucose-Capillary 263 (H) 70 - 99 mg/dL    Comment: Glucose reference range applies only to samples taken after fasting for at least 8 hours.  Glucose, capillary     Status: Abnormal   Collection Time: 01/23/22 11:37 AM  Result Value Ref Range   Glucose-Capillary 273 (H) 70 - 99 mg/dL    Comment: Glucose reference range applies only to samples taken after fasting for at least 8 hours.  Glucose, capillary     Status: Abnormal   Collection Time: 01/23/22  4:22 PM  Result Value Ref Range   Glucose-Capillary 269 (H) 70 - 99 mg/dL    Comment: Glucose reference range applies only to samples taken after fasting for at least 8 hours.  Glucose, capillary     Status: Abnormal   Collection Time: 01/23/22  7:16 PM  Result Value Ref Range   Glucose-Capillary 228 (H) 70 - 99 mg/dL    Comment: Glucose reference range applies only to samples taken after fasting for at least 8 hours.   Comment 1 Notify RN    Comment 2 Document in Chart   Glucose, capillary     Status: Abnormal   Collection Time: 01/23/22  7:57 PM  Result Value Ref Range   Glucose-Capillary 253 (H) 70 - 99 mg/dL    Comment: Glucose reference range applies only to samples taken after fasting for at least 8 hours.  Glucose, capillary     Status: Abnormal   Collection Time: 01/23/22 11:59 PM  Result Value Ref Range   Glucose-Capillary 275 (H) 70 - 99 mg/dL    Comment: Glucose reference range applies only to samples taken after fasting for at least 8 hours.  Glucose, capillary     Status: Abnormal   Collection Time: 01/24/22  4:25 AM  Result Value Ref Range   Glucose-Capillary 272 (H) 70 - 99 mg/dL    Comment: Glucose reference range applies only to samples taken after fasting for at least 8 hours.  Triglycerides     Status: Abnormal   Collection Time: 01/24/22  4:27 AM  Result  Value Ref Range   Triglycerides 223 (H) <150 mg/dL    Comment: Performed at [facility], [address] [place], [place] [address]  CBC     Status: Abnormal   Collection Time: 01/24/22  4:27 AM  Result Value Ref Range   WBC 24.0 (H) 4.0 - 10.5 K/uL   RBC 2.68 (L) 3.87 - 5.11 MIL/uL   Hemoglobin 8.2 (L) 12.0 - 15.0 g/dL   HCT 25.4 (L) 36.0 - 46.0 %   MCV 94.8 80.0 - 100.0 fL   MCH 30.6 26.0 - 34.0 pg   MCHC 32.3 30.0 - 36.0 g/dL   RDW 14.1 11.5 - 15.5 %   Platelets 196 150 - 400 K/uL   nRBC 0.1 0.0 - 0.2 %    Comment: Performed at [facility], [address] [place], [place] [address]  Basic metabolic panel     Status: Abnormal   Collection Time: 01/24/22  4:27 AM  Result Value Ref Range   Sodium 130 (L) 135 - 145 mmol/L   Potassium 3.5 3.5 - 5.1 mmol/L   Chloride 93 (L) 98 - 111 mmol/L   CO2 26 22 - 32 mmol/L  Glucose, Bld 289 (H) 70 - 99 mg/dL    Comment: Glucose reference range applies only to samples taken after fasting for at least 8 hours.   BUN 58 (H) 8 - 23 mg/dL   Creatinine, Ser 5.14 (H) 0.44 - 1.00 mg/dL   Calcium 8.7 (L) 8.9 - 10.3 mg/dL   GFR, Estimated 9 (L) >60 mL/min    Comment: (NOTE) Calculated using the CKD-EPI Creatinine Equation (2021)    Anion gap 11 5 - 15    Comment: Performed at [facility], [address] [place], [place] [address]  Magnesium     Status: None   Collection Time: 01/24/22  4:27 AM  Result Value Ref Range   Magnesium 1.7 1.7 - 2.4 mg/dL    Comment: Performed at [facility], [address], [place], [place] [address]  Phosphorus     Status: Abnormal   Collection Time: 01/24/22  4:27 AM  Result Value Ref Range   Phosphorus 6.0 (H) 2.5 - 4.6 mg/dL    Comment: Performed at [facility], [address] [place], [place] [address]  Glucose, capillary     Status: Abnormal   Collection Time: 01/24/22  7:09 AM  Result Value Ref Range   Glucose-Capillary 252 (H) 70 - 99 mg/dL     Comment: Glucose reference range applies only to samples taken after fasting for at least 8 hours.  Glucose, capillary     Status: Abnormal   Collection Time: 01/24/22 11:13 AM  Result Value Ref Range   Glucose-Capillary 246 (H) 70 - 99 mg/dL    Comment: Glucose reference range applies only to samples taken after fasting for at least 8 hours.    Recent Results (from the past 240 hour(s))  Resp Panel by RT-PCR (Flu A&B, Covid) Nasopharyngeal Swab     Status: None   Collection Time: 01/06/2022  8:30 PM   Specimen: Nasopharyngeal Swab; Nasopharyngeal(NP) swabs in vial transport medium  Result Value Ref Range Status   SARS Coronavirus 2 by RT PCR NEGATIVE NEGATIVE Final    Comment: (NOTE) SARS-CoV-2 target nucleic acids are NOT DETECTED.  The SARS-CoV-2 RNA is generally detectable in upper respiratory specimens during the acute phase of infection. The lowest concentration of SARS-CoV-2 viral copies this assay can detect is 138 copies/mL. A negative result does not preclude SARS-Cov-2 infection and should not be used as the sole basis for treatment or other patient management decisions. A negative result may occur with  improper specimen collection/handling, submission of specimen other than nasopharyngeal swab, presence of viral mutation(s) within the areas targeted by this assay, and inadequate number of viral copies(<138 copies/mL). A negative result must be combined with clinical observations, patient history, and epidemiological information. The expected result is Negative.  Fact Sheet for Patients:  EntrepreneurPulse.com.au  Fact Sheet for Healthcare Providers:  IncredibleEmployment.be  This test is no t yet approved or cleared by the [place] FDA and  has been authorized for detection and/or diagnosis of SARS-CoV-2 by FDA under an Emergency Use Authorization (EUA). This EUA will remain  in effect (meaning this test can be used) for the  duration of the COVID-19 declaration under Section 564(b)(1) of the Act, 21 U.S.C.section 360bbb-3(b)(1), unless the authorization is terminated  or revoked sooner.       Influenza A by PCR NEGATIVE NEGATIVE Final   Influenza B by PCR NEGATIVE NEGATIVE Final    Comment: (NOTE) The Xpert Xpress SARS-CoV-2/FLU/RSV plus assay is intended as an aid  in the diagnosis of influenza from Nasopharyngeal swab specimens and should not be used as a sole basis for treatment. Nasal washings and aspirates are unacceptable for Xpert Xpress SARS-CoV-2/FLU/RSV testing.  Fact Sheet for Patients: EntrepreneurPulse.com.au  Fact Sheet for Healthcare Providers: IncredibleEmployment.be  This test is not yet approved or cleared by the [place] FDA and has been authorized for detection and/or diagnosis of SARS-CoV-2 by FDA under an Emergency Use Authorization (EUA). This EUA will remain in effect (meaning this test can be used) for the duration of the COVID-19 declaration under Section 564(b)(1) of the Act, 21 U.S.C. section 360bbb-3(b)(1), unless the authorization is terminated or revoked.  Performed at [facility], [address] [place], [place] [address]   MRSA Next Gen by PCR, Nasal     Status: None   Collection Time: 01/20/22  8:50 AM   Specimen: Nasal Mucosa; Nasal Swab  Result Value Ref Range Status   MRSA by PCR Next Gen NOT DETECTED NOT DETECTED Final    Comment: (NOTE) The GeneXpert MRSA Assay (FDA approved for NASAL specimens only), is one component of a comprehensive MRSA colonization surveillance program. It is not intended to diagnose MRSA infection nor to guide or monitor treatment for MRSA infections. Test performance is not FDA approved in patients less than 46 years old. Performed at [facility], [address] [place], [place] [address]   Culture, Respiratory w Gram Stain     Status: None   Collection Time:  01/21/22  2:46 AM   Specimen: Tracheal Aspirate; Respiratory  Result Value Ref Range Status   Specimen Description   Final    TRACHEAL ASPIRATE Performed at [facility], [address] [place], [place] [address]    Special Requests   Final    NONE Performed at [facility], [address] [place], [place] [address]    Gram Stain   Final    ABUNDANT WBC PRESENT, PREDOMINANTLY PMN ABUNDANT GRAM POSITIVE COCCI IN CLUSTERS Performed at [facility], [address], [place], [place] [address]    Culture ABUNDANT STAPHYLOCOCCUS AUREUS  Final   Report Status 01/23/2022 FINAL  Final   Organism ID, Bacteria STAPHYLOCOCCUS AUREUS  Final      Susceptibility   Staphylococcus aureus - MIC*    CIPROFLOXACIN <=0.5 SENSITIVE Sensitive     ERYTHROMYCIN <=0.25 SENSITIVE Sensitive     GENTAMICIN <=0.5 SENSITIVE Sensitive     OXACILLIN <=0.25 SENSITIVE Sensitive     TETRACYCLINE <=1 SENSITIVE Sensitive     VANCOMYCIN <=0.5 SENSITIVE Sensitive     TRIMETH/SULFA <=10 SENSITIVE Sensitive     CLINDAMYCIN <=0.25 SENSITIVE Sensitive     RIFAMPIN <=0.5 SENSITIVE Sensitive     Inducible Clindamycin NEGATIVE Sensitive     * ABUNDANT STAPHYLOCOCCUS AUREUS    Lipid Panel Recent Labs    01/24/22 0427  TRIG 223*     Studies/Results: MR ANGIO HEAD WO CONTRAST  Result Date: 01/24/2022 CLINICAL DATA:  Neuro deficit, stroke suspected EXAM: MRA HEAD WITHOUT CONTRAST TECHNIQUE: Angiographic images of the Circle of Willis were acquired using MRA technique without intravenous contrast. COMPARISON:  No prior MRI, correlation is made with MRI 01/21/2022 FINDINGS: Anterior circulation: Both internal carotid arteries are patent to the termini, without stenosis or other abnormality. A1 segments patent. Normal anterior communicating artery. Anterior cerebral arteries are patent to their distal aspects. No M1 stenosis or occlusion. Normal MCA bifurcations. Distal MCA branches perfused and  symmetric. Posterior circulation: Right  dominant. Diminutive left V4 segment. Vertebral arteries patent to the vertebrobasilar junction without stenosis. Apparent discontinuity in the proximal V4 segments is favored to be secondary to motion artifact. Posterior inferior cerebral arteries patent bilaterally. Basilar patent to its distal aspect. Superior cerebellar arteries patent bilaterally. Bilateral P1 segments originate from the basilar artery. PCAs perfused to their distal aspects without stenosis. Bilateral posterior communicating arteries are visualized. Anatomic variants: None significant Other: None. IMPRESSION: No intracranial large vessel occlusion or significant stenosis. Electronically Signed   By: Merilyn Baba M.D.   On: 01/24/2022 01:10   DG Chest Port 1 View  Result Date: 01/23/2022 CLINICAL DATA:  Hypoxia, intubated EXAM: PORTABLE CHEST 1 VIEW COMPARISON:  01/21/2022 FINDINGS: Endotracheal tube at the thoracic inlet, 8 cm above the carina. Enteric tube courses into the mid stomach. Cardiomegaly with pulmonary vascular congestion. No frank interstitial edema. Small left pleural effusion. No pneumothorax. Left IJ venous catheter terminates in the proximal SVC. IMPRESSION: Endotracheal tube at the thoracic inlet, 8 cm above the carina. Additional support apparatus as above. Pulmonary vascular congestion without frank interstitial edema. Small left pleural effusion. Electronically Signed   By: Julian Hy M.D.   On: 01/23/2022 03:10    Medications: Scheduled:  albuterol  2.5 mg Nebulization Q6H   aspirin  81 mg Per Tube Daily   chlorhexidine gluconate (MEDLINE KIT)  15 mL Mouth Rinse BID   Chlorhexidine Gluconate Cloth  6 each Topical Q0600   docusate  100 mg Per Tube BID   epoetin (EPOGEN/PROCRIT) injection  4,000 Units Intravenous Q M,W,F-HD   feeding supplement (PROSource TF)  90 mL Per Tube TID   free water  30 mL Per Tube Q4H   heparin  5,000 Units Subcutaneous Q8H    hydrocortisone sod succinate (SOLU-CORTEF) inj  100 mg Intravenous Q8H   insulin aspart  0-20 Units Subcutaneous Q4H   insulin glargine-yfgn  8 Units Subcutaneous BID   levothyroxine  100 mcg Intravenous Daily   mouth rinse  15 mL Mouth Rinse 10 times per day   midodrine  10 mg Per Tube TID WC   multivitamin  1 tablet Per Tube QHS   pantoprazole (PROTONIX) IV  40 mg Intravenous Daily   polyethylene glycol  17 g Per Tube Daily   sodium chloride flush  10-40 mL Intracatheter Q12H   Continuous:  sodium chloride     feeding supplement (VITAL AF 1.2 CAL) 40 mL/hr at 01/24/22 0720   fentaNYL infusion INTRAVENOUS Stopped (01/24/22 1238)   magnesium sulfate bolus IVPB 50 mL/hr at 01/24/22 1500   norepinephrine (LEVOPHED) Adult infusion 2 mcg/min (01/24/22 1453)   vancomycin Stopped (01/23/22 1905)   vasopressin Stopped (01/23/22 2241)    Assessment: 66 year old female who presented to the hospital with severe metabolic derangements in the setting of missed hemodialysis sessions. She was subsequently intubated for respiratory distress. She has had persistent AMS since admission on Tuesday.  1. Exam continues to show findings consistent with diffuse cerebral hypofunction and did improve today despite holding sedation x30 min prior to exam. 2. Agree with Psychiatry that metabolic abnormalities due to failure to keep up with dialysis are most likely contributing to the patient's AMS. Also agree with possible contribution from severe hypothyroidism.   3. Head CT: No acute intracranial abnormality. Mild chronic small vessel ischemia 4. EEG 1/28: Finding of continuous generalized slowing is suggestive of moderate diffuse encephalopathy, nonspecific to etiology. No seizures or epileptiform discharges were seen throughout the recording. 5. MRI  brain: Punctate acute to subacute infarcts in the left inferior frontal gyrus, left occipital lobe, and right temporal lobe periventricular white matter. No  associated hemorrhage or mass effect. Mild chronic white matter microangiopathy. 6. Although the tiny foci of acute infarction are not felt to be contributing to her AMS, will need stroke work up.  7. TTE showed no intracardiac clot. Bilateral infarcts c/w central embolic source, but given pressor requirements will hold off on TEE at this time.  8. MRA head no hemodynamically-significant stenoses    Recommendations: 1. MRI brain wo contrast - repeat today given no improvement in exam r/o interval recurrent ischemic events 2. Repeat EEG today 3. Carotid ultrasound 4. Continue cardiac telemetry 5. Contine ASA 6. BP management per primary team. No indication for permissive HTN.  7. Agree with Psychiatry that goal of treatment would be to relieve all possible medical conditions that could be contributing. 8. Agree with Psychiatry medication recommendations.    This patient is critically ill and at significant risk of neurological worsening, death and care requires constant monitoring of vital signs, hemodynamics,respiratory and cardiac monitoring, neurological assessment, discussion with family, other specialists and medical decision making of high complexity. I spent 35 minutes of neurocritical care time  in the care of  this patient. This was time spent independent of any time provided by nurse practitioner or PA.  Su Monks, MD [facility] [phone]  If 7pm- 7am, please page neurology on call as listed in [place].

## 2022-01-25 DIAGNOSIS — G9341 Metabolic encephalopathy: Secondary | ICD-10-CM | POA: Diagnosis not present

## 2022-01-25 LAB — GLUCOSE, CAPILLARY
Glucose-Capillary: 240 mg/dL — ABNORMAL HIGH (ref 70–99)
Glucose-Capillary: 246 mg/dL — ABNORMAL HIGH (ref 70–99)
Glucose-Capillary: 253 mg/dL — ABNORMAL HIGH (ref 70–99)
Glucose-Capillary: 257 mg/dL — ABNORMAL HIGH (ref 70–99)
Glucose-Capillary: 330 mg/dL — ABNORMAL HIGH (ref 70–99)
Glucose-Capillary: 348 mg/dL — ABNORMAL HIGH (ref 70–99)

## 2022-01-25 LAB — CBC WITH DIFFERENTIAL/PLATELET
Abs Immature Granulocytes: 0.83 10*3/uL — ABNORMAL HIGH (ref 0.00–0.07)
Basophils Absolute: 0.1 10*3/uL (ref 0.0–0.1)
Basophils Relative: 0 %
Eosinophils Absolute: 0 10*3/uL (ref 0.0–0.5)
Eosinophils Relative: 0 %
HCT: 23.6 % — ABNORMAL LOW (ref 36.0–46.0)
Hemoglobin: 7.5 g/dL — ABNORMAL LOW (ref 12.0–15.0)
Immature Granulocytes: 4 %
Lymphocytes Relative: 3 %
Lymphs Abs: 0.7 10*3/uL (ref 0.7–4.0)
MCH: 30 pg (ref 26.0–34.0)
MCHC: 31.8 g/dL (ref 30.0–36.0)
MCV: 94.4 fL (ref 80.0–100.0)
Monocytes Absolute: 2 10*3/uL — ABNORMAL HIGH (ref 0.1–1.0)
Monocytes Relative: 9 %
Neutro Abs: 17.7 10*3/uL — ABNORMAL HIGH (ref 1.7–7.7)
Neutrophils Relative %: 84 %
Platelets: 206 10*3/uL (ref 150–400)
RBC: 2.5 MIL/uL — ABNORMAL LOW (ref 3.87–5.11)
RDW: 14 % (ref 11.5–15.5)
Smear Review: NORMAL
WBC: 21.3 10*3/uL — ABNORMAL HIGH (ref 4.0–10.5)
nRBC: 0.2 % (ref 0.0–0.2)

## 2022-01-25 LAB — BASIC METABOLIC PANEL
Anion gap: 15 (ref 5–15)
BUN: 98 mg/dL — ABNORMAL HIGH (ref 8–23)
CO2: 22 mmol/L (ref 22–32)
Calcium: 8.9 mg/dL (ref 8.9–10.3)
Chloride: 90 mmol/L — ABNORMAL LOW (ref 98–111)
Creatinine, Ser: 6.06 mg/dL — ABNORMAL HIGH (ref 0.44–1.00)
GFR, Estimated: 7 mL/min — ABNORMAL LOW (ref >60)
Glucose, Bld: 357 mg/dL — ABNORMAL HIGH (ref 70–99)
Potassium: 3.6 mmol/L (ref 3.5–5.1)
Sodium: 127 mmol/L — ABNORMAL LOW (ref 135–145)

## 2022-01-25 LAB — MAGNESIUM: Magnesium: 2.2 mg/dL (ref 1.7–2.4)

## 2022-01-25 LAB — PHOSPHORUS: Phosphorus: 5.7 mg/dL — ABNORMAL HIGH (ref 2.5–4.6)

## 2022-01-25 MED ORDER — PROPOFOL 1000 MG/100ML IV EMUL
5.0000 ug/kg/min | INTRAVENOUS | Status: DC
  Administered 2022-01-25: 80 ug/kg/min via INTRAVENOUS
  Administered 2022-01-25: 25 ug/kg/min via INTRAVENOUS
  Administered 2022-01-25: 80 ug/kg/min via INTRAVENOUS
  Administered 2022-01-25: 5 ug/kg/min via INTRAVENOUS
  Administered 2022-01-26 (×2): 75 ug/kg/min via INTRAVENOUS
  Administered 2022-01-26: 70 ug/kg/min via INTRAVENOUS
  Administered 2022-01-26: 75 ug/kg/min via INTRAVENOUS
  Administered 2022-01-26: 70 ug/kg/min via INTRAVENOUS
  Filled 2022-01-25 (×8): qty 100

## 2022-01-25 MED ORDER — FENTANYL CITRATE (PF) 100 MCG/2ML IJ SOLN: 100.0000 ug | Freq: Once | INTRAMUSCULAR | Status: AC

## 2022-01-25 MED ORDER — LORAZEPAM 2 MG/ML IJ SOLN
2.0000 mg | INTRAMUSCULAR | Status: DC | PRN
  Administered 2022-01-25: 2 mg via INTRAVENOUS
  Administered 2022-01-26 (×2): 4 mg via INTRAVENOUS
  Filled 2022-01-25 (×2): qty 2

## 2022-01-25 MED ORDER — LORAZEPAM 2 MG/ML IJ SOLN
INTRAMUSCULAR | Status: AC
  Administered 2022-01-25: 2 mg via INTRAVENOUS
  Filled 2022-01-25: qty 1

## 2022-01-25 MED ORDER — LEVOTHYROXINE SODIUM 100 MCG PO TABS
200.0000 ug | ORAL_TABLET | Freq: Every day | ORAL | Status: DC
  Administered 2022-01-26 – 2022-01-27 (×2): 200 ug
  Filled 2022-01-25 (×2): qty 2

## 2022-01-25 MED ORDER — VECURONIUM BROMIDE 10 MG IV SOLR
10.0000 mg | INTRAVENOUS | Status: DC | PRN
  Administered 2022-01-25: 20 mg via INTRAVENOUS

## 2022-01-25 MED ORDER — VECURONIUM BROMIDE 10 MG IV SOLR
INTRAVENOUS | Status: AC
  Filled 2022-01-25: qty 20

## 2022-01-25 MED ORDER — FENTANYL CITRATE (PF) 100 MCG/2ML IJ SOLN
INTRAMUSCULAR | Status: AC
  Administered 2022-01-25: 100 ug via INTRAVENOUS
  Filled 2022-01-25: qty 2

## 2022-01-25 MED ORDER — LORAZEPAM 2 MG/ML IJ SOLN: 2.0000 mg | Freq: Once | INTRAMUSCULAR | Status: AC

## 2022-01-25 MED ORDER — LORAZEPAM 2 MG/ML IJ SOLN
INTRAMUSCULAR | Status: AC
  Filled 2022-01-25: qty 1

## 2022-01-25 NOTE — Progress Notes (Signed)
Silver colored ring with multiple round clear stones removed from left ring finger at this time.  Patients husband asked if it could be removed when at the bedside this shift.  Placed in specimen cup with a patient label and left on computer in room to give to husband tomorrow 2/2.

## 2022-01-25 NOTE — Consult Note (Signed)
Pharmacy Antibiotic Note  Regina Jackson is a 66 y.o. female with medical history including ESRD on HD, hypothyroidism, anemia of chronic disease, endometrial cancer, HTN, diabetes admitted on 12/31/2021 with  acute metabolic encephalopathy in setting of severe metabolic derangements s/t missed hemodialysis, hypothyroidism . Patient ultimately developed acute respiratory distress requiring intubation and mechanical ventilation.  Pharmacy has been consulted for vancomycin dosing.  Plan:  Next HD today, continue vancomycin 1 g IV qHD --Continue to follow HD schedule per nephrology --Levels as clinically indicated  Height: 5' 7.52" (171.5 cm) Weight: 118.6 kg (261 lb 7.5 oz) IBW/kg (Calculated) : 62.8  Temp (24hrs), Avg:99.1 F (37.3 C), Min:98.3 F (36.8 C), Max:99.4 F (37.4 C)  Recent Labs  Lab 01/21/22 0530 01/21/22 0537 01/22/22 0340 01/23/22 0407 01/24/22 0427 01/25/22 0422  WBC  --  15.1* 17.8* 20.2* 24.0* 21.3*  CREATININE 4.95*  --  6.22* 7.47* 5.14* 6.06*     Estimated Creatinine Clearance: 12.8 mL/min (A) (by C-G formula based on SCr of 6.06 mg/dL (H)).    Allergies  Allergen Reactions   Amoxicillin    Augmentin [Amoxicillin-Pot Clavulanate]    Ceftin [Cefuroxime]    Compazine [Prochlorperazine]    Demerol [Meperidine Hcl]    Macrobid [Nitrofurantoin]    Phenergan [Promethazine]    Sulfa Antibiotics     Antimicrobials this admission: Vancomycin 1/29 >>   Dose adjustments this admission: N/A  Microbiology results: 1/27 MRSA PCR: (-) 1/28 Sputum: Staphylococcus aureus, pan sensitive   Thank you for allowing pharmacy to be a part of this patients care.  Owens Loffler 01/25/2022 11:10 AM

## 2022-01-25 NOTE — Progress Notes (Signed)
NAME:  Regina Jackson, MRN:  [id number], DOB:  1956/04/28, LOS: 8 ADMISSION DATE:  01/24/2022, CONSULTATION DATE:  01/20/22 REFERRING MD:  Dr. Mal Misty, CHIEF COMPLAINT:  AMS, Acute Respiratory Distress   Brief Pt Description / Synopsis:  66 y.o. Female with PMH significant for ESRD on HD, admitted with Acute Metabolic Encephalopathy due to multiple severe metabolic derangements in setting of missed HD sessions and Hypothyroidism (query ? Myxedema coma).  Developed acute respiratory distress requiring intubation and mechanical ventilation.  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  Patient had no chest pain, shortness of breath, abdominal pain, nausea, vomiting, diarrhea.  The patient's husband reported she had not been compliant with her home hemodialysis in nearly 8 days, also unsure if she has been taking her thyroid medication.  ED Course: Initial vital signs: Temperature 97.7 orally, respiratory 15, pulse 96, blood pressure 199/163 Significant labs: Sodium 134, bicarbonate 16, glucose 211, BUN 49, creatinine 5.9, alkaline phosphatase 132, BNP 329, high-sensitivity troponin 42, WBC 11.3, hemoglobin 10.9, TSH 58.8 Venous blood gas: pH 7.3/PCO2 33/PO2 54/bicarbonate 16.2 COVID-19 PCR negative Imaging: Chest x-ray:Mild cardiomegaly with vascular congestion. Possible small left effusion. No consolidation or pneumothorax CT head:IMPRESSION: 1. No acute intracranial abnormality. 2. Mild chronic small vessel ischemia.   She was placed under IVC, and the hospitalist were asked to admit.  Nephrology was consulted for dialysis during her stay.  However given her agitation and confusion, dialysis  was unable to's be performed safely therefore he was deferred for reevaluation in the next day.   Pertinent  Medical History  ESRD on hemodialysis Noncompliance with dialysis Anemia of chronic disease Endometrial cancer Hypothyroidism Hypertension diabetes mellitus  Micro Data:  1/24: SARS-CoV-2 and influenza PCR>> negative 1/25: HIV screen>> nonreactive 1/27: MRSA PCR>> negative 1/27: Tracheal aspirate>>abundant GPC, Staph aureus, sensitivities pending  Antimicrobials:  1/29 vancomycin>>  Significant Hospital Events: Including procedures, antibiotic start and stop dates in addition to other pertinent events   1/24: Admitted by the hospitalist for altered mental status and hypertensive urgency.  Nephrology consulted 1/25: Unable to safely perform dialysis due to severe agitation and confusion.  Plan to reassess HD tomorrow 1/26: Received IV Haldol and Valium without improvement in mental status.  Psychiatry consulted 1/27: Required emergent intubation due to AMS and Acute Respiratory Distress.  MRI and EEG pending, Neuro consulted.  Plan for HD now that pt is sedated 1/28 remains on vent,poorly responsive on SAT, MRI today 1/29 copious ETT secretions, started Levaquin (allergies to ampicillin/Unasyn), cardiogram done, pending 1/30 MRI +for acute CVA, severe resp failure 1/31 remains on vent and pressors 2/1 MRI shows multiple new strokes, resp failure  Interim History / Subjective:  1/28 punctate Acute CVA  1/31 MRI shows multiple new strokes severe brain damage at this time +Staph pneumonia Remains critically ill Prognosis is very poor Remains on vent and pressors +septic shock    Objective   Blood pressure (!) 110/46, pulse 89, temperature 99.4 F (37.4 C), temperature source Axillary, resp. rate 19, height 5' 7.52" (1.715 m), weight 119.4 kg, SpO2 97 %. CVP:  [9 mmHg] 9 mmHg  Vent Mode: PRVC FiO2 (%):  [40 %] 40 % Set Rate:  [20 bmp] 20 bmp Vt Set:  [500 mL]  500  mL PEEP:  [5 cmH20-8 cmH20] 8 cmH20   Intake/Output Summary (Last 24 hours) at 01/25/2022 0748 Last data filed at 01/25/2022 0700 Gross per 24 hour  Intake 2100.94 ml  Output 300 ml  Net 1800.94 ml    Filed Weights   01/20/22 1752 01/23/22 1531 01/23/22 1935  Weight: 114.4 kg 121 kg 119.4 kg    REVIEW OF SYSTEMS  PATIENT IS UNABLE TO PROVIDE COMPLETE REVIEW OF SYSTEMS DUE TO SEVERE CRITICAL ILLNESS AND TOXIC METABOLIC ENCEPHALOPATHY    PHYSICAL EXAMINATION:  GENERAL:critically ill appearing, +resp distress EYES: Pupils equal, round, reactive to light.  No scleral icterus.  MOUTH: Moist mucosal membrane. INTUBATED NECK: Supple.  PULMONARY: +rhonchi, +wheezing CARDIOVASCULAR: S1 and S2.  No murmurs  GASTROINTESTINAL: Soft, nontender, -distended. Positive bowel sounds.  MUSCULOSKELETAL: No swelling, clubbing, or edema.  NEUROLOGIC: obtunded SKIN:intact,warm,dry     Assessment & Plan:   Acute Hypoxic Respiratory Failure in the setting of severe metabolic derangements Inability to protect airway with acute CVA, STAPH PNEUMONIA with new multiple acute strokes with signs of severe brain damage   Severe ACUTE Hypoxic and Hypercapnic Respiratory Failure -continue Mechanical Ventilator support -Wean Fio2 and PEEP as tolerated -VAP/VENT bundle implementation - Wean PEEP & FiO2 as tolerated, maintain SpO2 > 88% - Head of bed elevated 30 degrees, VAP protocol in place - Plateau pressures less than 30 cm H20  - Intermittent chest x-ray & ABG PRN - Ensure adequate pulmonary hygiene  -will perform SAT/SBT when respiratory parameters are met  SEPTIC shock SOURCE-pneumonia -use vasopressors to keep MAP>65 as needed -follow ABG and LA as needed +Vancomycin    NEUROLOGY ACUTE TOXIC METABOLIC ENCEPHALOPATHY Signs of significant brain damage Acute Metabolic Encephalopathy in setting of multiple severe metabolic derangements (AKI and uremia, Hypothyroidism with ? Myxedema  coma Multiple  acute infarcts Sedation needs in the setting of mechanical ventilation -Maintain a RASS goal of 0 to -1 Neurology consult- appreciate input -Correction of multiple metabolic derangements as above -Thiamine IV -Synthroid IV   RENAL ESRD on Hemodialysis Mild Hyperkalemia Metabolic Acidosis -HD as per Nephrology   ENDO - ICU hypoglycemic\Hyperglycemia protocol -check FSBS per protocol Diabetes Mellitus Hypothyroidism, query ? Myxedema Coma PMHx: Radioactive Iodine Thyroid ablation -CBG's q4h; Target range of 140 to 180 -SSI -Follow ICU Hypo/Hyperglycemia protocol -Continue IV Synthroid 100 mg daily (received 200 mg x1 earlier in stay) -Follow TSH and thyroid panel   GI GI PROPHYLAXIS as indicated  NUTRITIONAL STATUS DIET-->TF's as tolerated Constipation protocol as indicated   ELECTROLYTES -follow labs as needed -replace as needed -pharmacy consultation and following      Best Practice (right click and "Reselect all SmartList Selections" daily)   Diet/type: NPO DVT prophylaxis: prophylactic heparin  GI prophylaxis: PPI Lines: Central line and yes and it is still needed Foley:  Yes, and it is still needed Code Status: DNR status Last date of multidisciplinary goals of care discussion [1/27]    Labs   CBC: Recent Labs  Lab 01/19/22 0918 01/20/22 0417 01/20/22 1058 01/21/22 0537 01/22/22 0340 01/23/22 0407 01/24/22 0427 01/25/22 0422  WBC 14.0* 17.7*   < > 15.1* 17.8* 20.2* 24.0* 21.3*  NEUTROABS 12.1* 15.3*  --   --   --   --   --  17.7*  HGB 10.2* 9.9*   < > 8.5* 8.3* 8.6* 8.2* 7.5*  HCT 31.6* 31.5*   < > 27.7* 27.1* 27.6* 25.4* 23.6*  MCV 91.1 92.6   < > 96.2 96.4  97.5 94.8 94.4  PLT 260 285   < > 216 170 183 196 206   < > = values in this interval not displayed.     Basic Metabolic Panel: Recent Labs  Lab 01/21/22 0530 01/21/22 0537 01/22/22 0340 01/23/22 0407 01/24/22 0427 01/25/22 0422  NA 137  --  132* 135 130*  127*  K 3.6  --  3.6 3.7 3.5 3.6  CL 105  --  101 99 93* 90*  CO2 24  --  20* $Re'22 26 22  'JJy$ GLUCOSE 154*  --  230* 235* 289* 357*  BUN 33*  --  58* 86* 58* 98*  CREATININE 4.95*  --  6.22* 7.47* 5.14* 6.06*  CALCIUM 8.5*  --  8.8* 8.8* 8.7* 8.9  MG  --  1.8 1.8 1.7 1.7 2.2  PHOS 4.5  --  6.3* 7.1* 6.0* 5.7*    GFR: Estimated Creatinine Clearance: 12.8 mL/min (A) (by C-G formula based on SCr of 6.06 mg/dL (H)). Recent Labs  Lab 01/20/22 2252 01/21/22 0537 01/22/22 0340 01/23/22 0407 01/24/22 0427 01/25/22 0422  PROCALCITON 0.56 0.80 1.86  --   --   --   WBC  --  15.1* 17.8* 20.2* 24.0* 21.3*     Liver Function Tests: Recent Labs  Lab 01/19/22 0918 01/20/22 0417 01/20/22 1058 01/21/22 0530 01/22/22 0340 01/23/22 0407  AST 50* 79* 67*  --   --   --   ALT 27 39 37  --   --   --   ALKPHOS 119 126 114  --   --   --   BILITOT 0.8 0.8 0.7  --   --   --   PROT 7.1 7.4 6.7  --   --   --   ALBUMIN 3.3* 3.6 3.0* 2.9* 2.6* 2.3*    No results for input(s): LIPASE, AMYLASE in the last 168 hours. Recent Labs  Lab 01/19/22 0918  AMMONIA 16     ABG    Component Value Date/Time   PHART 7.18 (LL) 01/23/2022 0318   PCO2ART 43 01/23/2022 0318   PO2ART 84 01/23/2022 0318   HCO3 16.0 (L) 01/23/2022 0318   ACIDBASEDEF 11.7 (H) 01/23/2022 0318   O2SAT 93.1 01/23/2022 0318    HbA1C: Hgb A1c MFr Bld  Date/Time Value Ref Range Status  01/18/2022 06:25 AM 9.2 (H) 4.8 - 5.6 % Final    Comment:    (NOTE)         Prediabetes: 5.7 - 6.4         Diabetes: >6.4         Glycemic control for adults with diabetes: <7.0     CBG: Recent Labs  Lab 01/24/22 1113 01/24/22 1555 01/24/22 1904 01/24/22 2329 01/25/22 0455  GLUCAP 246* 231* 263* 313* 330*    MRI brain 1/28: IMPRESSION: 1. 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 No associated hemorrhage or mass effect. 2.  Mild chronic white matter microangiopathy.  2D echo performed today, interpretation pending.   Review of Systems:   Unable to assess due to AMS/mechanically ventilated status  Allergies Allergies  Allergen Reactions   Amoxicillin    Augmentin [Amoxicillin-Pot Clavulanate]    Ceftin [Cefuroxime]    Compazine [Prochlorperazine]    Demerol [Meperidine Hcl]    Macrobid [Nitrofurantoin]    Phenergan [Promethazine]    Sulfa Antibiotics      Medications   Scheduled  Meds:  albuterol  2.5 mg Nebulization Q6H   aspirin  81 mg Per Tube Daily   chlorhexidine gluconate (MEDLINE KIT)  15 mL Mouth Rinse BID   Chlorhexidine Gluconate Cloth  6 each Topical Q0600   docusate  100 mg Per Tube BID   epoetin (EPOGEN/PROCRIT) injection  4,000 Units Intravenous Q M,W,F-HD   feeding supplement (PROSource TF)  90 mL Per Tube TID   free water  30 mL Per Tube Q4H   heparin  5,000 Units Subcutaneous Q8H   hydrocortisone sod succinate (SOLU-CORTEF) inj  100 mg Intravenous Q8H   insulin aspart  0-20 Units Subcutaneous Q4H   insulin glargine-yfgn  8 Units Subcutaneous BID   levothyroxine  100 mcg Intravenous Daily   mouth rinse  15 mL Mouth Rinse 10 times per day   midodrine  10 mg Per Tube TID WC   multivitamin  1 tablet Per Tube QHS   pantoprazole (PROTONIX) IV  40 mg Intravenous Daily   polyethylene glycol  17 g Per Tube Daily   sodium chloride flush  10-40 mL Intracatheter Q12H   Continuous Infusions:  sodium chloride     feeding supplement (VITAL AF 1.2 CAL) 1,000 mL (01/25/22 0313)   fentaNYL infusion INTRAVENOUS Stopped (01/24/22 1238)   norepinephrine (LEVOPHED) Adult infusion 2 mcg/min (01/24/22 1453)   vancomycin Stopped (01/23/22 1905)   vasopressin Stopped (01/23/22 2241)   PRN Meds:.sodium chloride, acetaminophen **OR** acetaminophen, alteplase, fentaNYL (SUBLIMAZE) injection, fentaNYL (SUBLIMAZE) injection, fentaNYL (SUBLIMAZE) injection, fentaNYL (SUBLIMAZE) injection, heparin,  lidocaine (PF), lidocaine-prilocaine, midazolam, ondansetron **OR** ondansetron (ZOFRAN) IV, pentafluoroprop-tetrafluoroeth, sodium chloride flush, vancomycin    DVT/GI PRX  assessed I Assessed the need for Labs I Assessed the need for Foley I Assessed the need for Central Venous Line Family Discussion when available I Assessed the need for Mobilization I made an Assessment of medications to be adjusted accordingly Safety Risk assessment completed  CASE DISCUSSED IN MULTIDISCIPLINARY ROUNDS WITH ICU TEAM     Critical Care Time devoted to patient care services described in this note is 55 minutes.  Critical care was necessary to treat /prevent imminent and life-threatening deterioration. Overall, patient is critically ill, prognosis is guarded.  Patient with Multiorgan failure and at high risk for cardiac arrest and death.   PROGNOSIS IS VERY POOR, VERY POOR CHANCE OF MEANINGFUL RECOVERY  Baleria Wyman Patricia Pesa, M.D.  [facility]  Medical Director [place] Director [facility] Cardio-Pulmonary Department

## 2022-01-25 NOTE — Progress Notes (Signed)
Palliative:  HPI: 66 y.o. female  with past medical history of ESRD on home hemodialysis 3x weekly (still produces urine), hypothyroidism (s/p radioactive iodine thyroid ablation), anemia related to chronic renal disease, endometrial cancer (s/p surgery and chemo 2021 and no signs of recurrence), diabetes, hypertension admitted on 12/26/2021 with altered mental status and agitation with noted non-adherence to dialysis for 8 days per family (unclear if she was taking medication). Patient is notably under IVC. 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   I met today along with Dr. Mortimer Fries with husband and daughter at bedside. Difficult discussion explaining further evidence of more stroke activity explaining her poor neurological status. Gently explained expectation for poor prognosis. Discussed two paths of treatment with aggressive care with trach/PEG vs one way extubation to comfort care. Discussed the importance of quality of life and what each path would realistically look like for Regina Jackson. Discussed what is important to Regina Jackson and what makes her who she is as a person, wife, and mother. We discussed that the damage from strokes to the brain is significant and likely to impact all of her functions. Daughter went downstairs to bring her significant other up so that we could explain the situation to him. I explained to him our conversation above. He asked good questions and was very supportive to family. They plan to have ongoing discussions amongst family. Regina Jackson's son will be present tomorrow and we will discuss further with him as well.   All questions/concerns addressed. Emotional support provided.   Exam: Unresponsive. Tolerating vent support. Requiring prn sedation due to tachypnea/labored breathing with use of accessory muscles. HR 90s. Abd soft. Warm to touch.   Plan: - DNR previously decided - Ongoing family discussion for potential transition  to comfort care.   [place], NP Palliative Medicine Team Pager [phone] (Please see [email or web address] for schedule) Team Phone [phone]    Greater than 50%  of this time was spent counseling and coordinating care related to the above assessment and plan

## 2022-01-25 NOTE — Progress Notes (Signed)
Inpatient Diabetes Program Recommendations  AACE/ADA: New Consensus Statement on Inpatient Glycemic Control (2015)  Target Ranges:  Prepandial:   less than 140 mg/dL      Peak postprandial:   less than 180 mg/dL (1-2 hours)      Critically ill patients:  140 - 180 mg/dL    Latest Reference Range & Units 01/23/22 23:59 01/24/22 04:25 01/24/22 07:09 01/24/22 11:13 01/24/22 15:55 01/24/22 19:04  Glucose-Capillary 70 - 99 mg/dL 275 (H)  11 units Novolog 272 (H)  11 units Novolog 252 (H)  11 units Novolog  8 units Semglee 246 (H)  7 units Novolog 231 (H)  7 units Novolog 263 (H)  4 units Novolog   (H): Data is abnormally high  Latest Reference Range & Units 01/24/22 23:29 01/25/22 04:55  Glucose-Capillary 70 - 99 mg/dL 313 (H)  15 units Novolog  8 units Semglee 330 (H)  15 units Novolog  (H): Data is abnormally high  Home DM Meds: Humalog 14 units TID with meals (PCP visit 07/29/2021)                              Lantus 80 units Daily (PCP visit 07/29/2021)                              Ozempic     Current Orders: Novolog Resistant Correction Scale/ SSI (0-20 units) Q4 hours                            Semglee 8 units BID         MD- Note patient supposed to be taking Lantus and Humalog insulins at home.  Currently on Vent getting Tube Feeds at 55cc/hr.  Started Solucortef 100 mg Q8 hours.     Please consider:   1. Increase Semglee to 17 units BID (0.3 units/kg--split dose)   2. Start Novolog Tube Feed coverage: Novolog 4 units Q4 hours HOLD if tube feeds HELD for any reason     --Will follow patient during hospitalization--  Wyn Quaker RN, MSN, CDE Diabetes Coordinator Inpatient Glycemic Control Team Team Pager: [phone] (8a-5p)

## 2022-01-25 NOTE — Progress Notes (Addendum)
HD notes  10:11 Pt started tx with 108/47 BP, at 11:00 pt bp dropped to 81/40 map 51. Bp readjusted and repeat Bp, still Low. UF off, 100cc NSS flushed given. Repeat BP still Low, another 100cc nSS flushed given,  icu nurse made. Repeat bp still low, ICU nurse increased Norepinephrine  to 31mcg. Repeat Bp 138/58 map 80. UF turned on. Continue to monitor.  13:13 HD completed. Total UF net removed 1171ml

## 2022-01-25 NOTE — Progress Notes (Signed)
Nutrition Follow-up  DOCUMENTATION CODES:   Obesity unspecified  INTERVENTION:   -Continue renal MVI daily via tube -Continue Vital AF 1.2 @ 55 ml/hr via OGT    45 ml Prosource TF TID.     30 ml free water flush every 4 hours for tube patency   Tube feeding regimen provides 1704 kcal (100% of needs), 132 grams of protein, and 1071 ml of H2O. Total free water: 1251 ml daily  TF + propofol provides 2174 kcals  NUTRITION DIAGNOSIS:   Inadequate oral intake related to inability to eat (pt sedated and ventilated) as evidenced by NPO status.  Ongoing  GOAL:   Provide needs based on ASPEN/SCCM guidelines  Met with TF  MONITOR:   Vent status, Labs, Weight trends, Skin, I & O's  REASON FOR ASSESSMENT:   Ventilator    ASSESSMENT:   66 y/o female with h/o ESRD on HD, anemia of chronic disease, DM, hypothyroidism, s/p radioactive iodine thyroid ablation, endometrial cancer and HTN who is admitted with AMS and myxedema coma.  1/28- EEG 1/29- MRI  Patient is currently intubated on ventilator support MV: 10.1 L/min Temp (24hrs), Avg:99.1 F (37.3 C), Min:98.3 F (36.8 C), Max:99.4 F (37.4 C)  Propofol: 17.79 ml/hr (provides 470 kcals daily)  Reviewed I/O's: +1.8 L x 24 hours and +7.9 L since admission  UOP: 300 ml x 24 hours  Case discussed with RN, MD, and during ICU rounds.  Pt with multiple MRIs which revealed worsening strokes and brain damage.  Pt with grave prognosis and is awaiting neurology assessment.   Palliative care following for goals of care discussions.   Medications reviewed and include miralax and levophed.    Lab Results  Component Value Date   HGBA1C 9.2 (H) 01/18/2022   PTA DM medications are .   Labs reviewed: Na: 127, Phos: 5.7, CBGS: 253-348 (inpatient orders for glycemic control are 8 units insulin glargine-yfgn BID and 0-20 units insulin glargine-yfgn every 4 hours).    Diet Order:   Diet Order             Diet NPO time  specified  Diet effective now                   EDUCATION NEEDS:   No education needs have been identified at this time  Skin:  Skin Assessment: Reviewed RN Assessment  Last BM:  Unknown  Height:   Ht Readings from Last 1 Encounters:  01/20/22 5' 7.52" (1.715 m)    Weight:   Wt Readings from Last 1 Encounters:  01/25/22 117.5 kg    Ideal Body Weight:  63.6 kg  BMI:  Body mass index is 39.95 kg/m.  Estimated Nutritional Needs:   Kcal:  4034-7425  Protein:  > 127 grams  Fluid:  > 1.5 L    Loistine Chance, RD, LDN, [place] Registered Dietitian II Certified Diabetes Care and Education Specialist Please refer to [facility] for RD and/or RD on-call/weekend/after hours pager

## 2022-01-25 NOTE — Progress Notes (Signed)
Subjective: Patient unable to be examined today bc she just received vecuronium to be able to tolerate HD.  Personal review of MRI brain shows numerous new acute infarcts bilaterally, c/w central embolic source.  Objective: Current vital signs: BP (!) 117/55    Pulse 92    Temp 99.1 F (37.3 C) (Axillary)    Resp (!) 21    Ht 5' 7.52" (1.715 m)    Wt 117.5 kg    SpO2 97%    BMI 39.95 kg/m  Vital signs in last 24 hours: Temp:  [99 F (37.2 C)-99.4 F (37.4 C)] 99.1 F (37.3 C) (02/01 1315) Pulse Rate:  [86-110] 92 (02/01 1700) Resp:  [18-34] 21 (02/01 1700) BP: (81-154)/(33-65) 117/55 (02/01 1700) SpO2:  [96 %-100 %] 97 % (02/01 1700) FiO2 (%):  [40 %] 40 % (02/01 1437) Weight:  [117.5 kg-118.6 kg] 117.5 kg (02/01 1315)  Intake/Output from previous day: 01/31 0701 - 02/01 0700 In: 2100.9 [I.V.:271.3; NG/GT:1729.4; IV Piggyback:100.2] Out: 300 [Urine:300] Intake/Output this shift: No intake/output data recorded. Nutritional status:  Diet Order             Diet NPO time specified  Diet effective now                  Patient unable to be examined today 2/2 having just received vecuronium. See exam from yesterday for most recent neurologic exam off sedation.  Lab Results: Results for orders placed or performed during the hospital encounter of 01/12/2022 (from the past 48 hour(s))  Glucose, capillary     Status: Abnormal   Collection Time: 01/23/22 11:59 PM  Result Value Ref Range   Glucose-Capillary 275 (H) 70 - 99 mg/dL    Comment: Glucose reference range applies only to samples taken after fasting for at least 8 hours.  Glucose, capillary     Status: Abnormal   Collection Time: 01/24/22  4:25 AM  Result Value Ref Range   Glucose-Capillary 272 (H) 70 - 99 mg/dL    Comment: Glucose reference range applies only to samples taken after fasting for at least 8 hours.  Triglycerides     Status: Abnormal   Collection Time: 01/24/22  4:27 AM  Result Value Ref Range    Triglycerides 223 (H) <150 mg/dL    Comment: Performed at [facility], [address] [place], [place] [address]  CBC     Status: Abnormal   Collection Time: 01/24/22  4:27 AM  Result Value Ref Range   WBC 24.0 (H) 4.0 - 10.5 K/uL   RBC 2.68 (L) 3.87 - 5.11 MIL/uL   Hemoglobin 8.2 (L) 12.0 - 15.0 g/dL   HCT 25.4 (L) 36.0 - 46.0 %   MCV 94.8 80.0 - 100.0 fL   MCH 30.6 26.0 - 34.0 pg   MCHC 32.3 30.0 - 36.0 g/dL   RDW 14.1 11.5 - 15.5 %   Platelets 196 150 - 400 K/uL   nRBC 0.1 0.0 - 0.2 %    Comment: Performed at [facility], [address] [place], [place] [address]  Basic metabolic panel     Status: Abnormal   Collection Time: 01/24/22  4:27 AM  Result Value Ref Range   Sodium 130 (L) 135 - 145 mmol/L   Potassium 3.5 3.5 - 5.1 mmol/L   Chloride 93 (L) 98 - 111 mmol/L   CO2 26 22 - 32 mmol/L   Glucose, Bld 289 (H) 70 - 99 mg/dL    Comment:  Glucose reference range applies only to samples taken after fasting for at least 8 hours.   BUN 58 (H) 8 - 23 mg/dL   Creatinine, Ser 5.14 (H) 0.44 - 1.00 mg/dL   Calcium 8.7 (L) 8.9 - 10.3 mg/dL   GFR, Estimated 9 (L) >60 mL/min    Comment: (NOTE) Calculated using the CKD-EPI Creatinine Equation (2021)    Anion gap 11 5 - 15    Comment: Performed at [facility], [address], [place], [place] [address]  Magnesium     Status: None   Collection Time: 01/24/22  4:27 AM  Result Value Ref Range   Magnesium 1.7 1.7 - 2.4 mg/dL    Comment: Performed at [facility], [address], [place], [place] [address]  Phosphorus     Status: Abnormal   Collection Time: 01/24/22  4:27 AM  Result Value Ref Range   Phosphorus 6.0 (H) 2.5 - 4.6 mg/dL    Comment: Performed at [facility], [address] [place], [place] [address]  Glucose, capillary     Status: Abnormal   Collection Time: 01/24/22  7:09 AM  Result Value Ref Range   Glucose-Capillary 252 (H) 70 - 99 mg/dL    Comment: Glucose  reference range applies only to samples taken after fasting for at least 8 hours.  Glucose, capillary     Status: Abnormal   Collection Time: 01/24/22 11:13 AM  Result Value Ref Range   Glucose-Capillary 246 (H) 70 - 99 mg/dL    Comment: Glucose reference range applies only to samples taken after fasting for at least 8 hours.  Glucose, capillary     Status: Abnormal   Collection Time: 01/24/22  3:55 PM  Result Value Ref Range   Glucose-Capillary 231 (H) 70 - 99 mg/dL    Comment: Glucose reference range applies only to samples taken after fasting for at least 8 hours.  Glucose, capillary     Status: Abnormal   Collection Time: 01/24/22  7:04 PM  Result Value Ref Range   Glucose-Capillary 263 (H) 70 - 99 mg/dL    Comment: Glucose reference range applies only to samples taken after fasting for at least 8 hours.   Comment 1 Notify RN    Comment 2 Document in Chart   Glucose, capillary     Status: Abnormal   Collection Time: 01/24/22 11:29 PM  Result Value Ref Range   Glucose-Capillary 313 (H) 70 - 99 mg/dL    Comment: Glucose reference range applies only to samples taken after fasting for at least 8 hours.   Comment 1 Notify RN    Comment 2 Document in Chart   CBC with Differential/Platelet     Status: Abnormal   Collection Time: 01/25/22  4:22 AM  Result Value Ref Range   WBC 21.3 (H) 4.0 - 10.5 K/uL   RBC 2.50 (L) 3.87 - 5.11 MIL/uL   Hemoglobin 7.5 (L) 12.0 - 15.0 g/dL   HCT 23.6 (L) 36.0 - 46.0 %   MCV 94.4 80.0 - 100.0 fL   MCH 30.0 26.0 - 34.0 pg   MCHC 31.8 30.0 - 36.0 g/dL   RDW 14.0 11.5 - 15.5 %   Platelets 206 150 - 400 K/uL   nRBC 0.2 0.0 - 0.2 %   Neutrophils Relative % 84 %   Neutro Abs 17.7 (H) 1.7 - 7.7 K/uL   Lymphocytes Relative 3 %   Lymphs Abs 0.7 0.7 - 4.0 K/uL   Monocytes  Relative 9 %   Monocytes Absolute 2.0 (H) 0.1 - 1.0 K/uL   Eosinophils Relative 0 %   Eosinophils Absolute 0.0 0.0 - 0.5 K/uL   Basophils Relative 0 %   Basophils Absolute 0.1 0.0 -  0.1 K/uL   WBC Morphology MORPHOLOGY UNREMARKABLE    RBC Morphology MORPHOLOGY UNREMARKABLE    Smear Review Normal platelet morphology    Immature Granulocytes 4 %   Abs Immature Granulocytes 0.83 (H) 0.00 - 0.07 K/uL    Comment: Performed at [facility], [address] [place], [place] [address]  Magnesium     Status: None   Collection Time: 01/25/22  4:22 AM  Result Value Ref Range   Magnesium 2.2 1.7 - 2.4 mg/dL    Comment: Performed at [facility], [address] [place], [place] [address]  Phosphorus     Status: Abnormal   Collection Time: 01/25/22  4:22 AM  Result Value Ref Range   Phosphorus 5.7 (H) 2.5 - 4.6 mg/dL    Comment: Performed at [facility], [address] [place], [place] [address]  Basic metabolic panel     Status: Abnormal   Collection Time: 01/25/22  4:22 AM  Result Value Ref Range   Sodium 127 (L) 135 - 145 mmol/L   Potassium 3.6 3.5 - 5.1 mmol/L   Chloride 90 (L) 98 - 111 mmol/L   CO2 22 22 - 32 mmol/L   Glucose, Bld 357 (H) 70 - 99 mg/dL    Comment: Glucose reference range applies only to samples taken after fasting for at least 8 hours.   BUN 98 (H) 8 - 23 mg/dL   Creatinine, Ser 6.06 (H) 0.44 - 1.00 mg/dL   Calcium 8.9 8.9 - 10.3 mg/dL   GFR, Estimated 7 (L) >60 mL/min    Comment: (NOTE) Calculated using the CKD-EPI Creatinine Equation (2021)    Anion gap 15 5 - 15    Comment: Performed at [facility], [address] [place],  [address]  Glucose, capillary     Status: Abnormal   Collection Time: 01/25/22  4:55 AM  Result Value Ref Range   Glucose-Capillary 330 (H) 70 - 99 mg/dL    Comment: Glucose reference range applies only to samples taken after fasting for at least 8 hours.   Comment 1 Notify RN    Comment 2 Document in Chart   Glucose, capillary     Status: Abnormal   Collection Time: 01/25/22  7:52 AM  Result Value Ref Range   Glucose-Capillary 348 (H) 70 - 99 mg/dL    Comment:  Glucose reference range applies only to samples taken after fasting for at least 8 hours.  Glucose, capillary     Status: Abnormal   Collection Time: 01/25/22 11:33 AM  Result Value Ref Range   Glucose-Capillary 253 (H) 70 - 99 mg/dL    Comment: Glucose reference range applies only to samples taken after fasting for at least 8 hours.  Glucose, capillary     Status: Abnormal   Collection Time: 01/25/22  4:50 PM  Result Value Ref Range   Glucose-Capillary 246 (H) 70 - 99 mg/dL    Comment: Glucose reference range applies only to samples taken after fasting for at least 8 hours.  Glucose, capillary     Status: Abnormal   Collection Time: 01/25/22  7:24 PM  Result Value Ref Range   Glucose-Capillary 240 (H) 70 - 99 mg/dL    Comment: Glucose reference  range applies only to samples taken after fasting for at least 8 hours.   Comment 1 Notify RN    Comment 2 Document in Chart     Recent Results (from the past 240 hour(s))  Resp Panel by RT-PCR (Flu A&B, Covid) Nasopharyngeal Swab     Status: None   Collection Time: 12/28/2021  8:30 PM   Specimen: Nasopharyngeal Swab; Nasopharyngeal(NP) swabs in vial transport medium  Result Value Ref Range Status   SARS Coronavirus 2 by RT PCR NEGATIVE NEGATIVE Final    Comment: (NOTE) SARS-CoV-2 target nucleic acids are NOT DETECTED.  The SARS-CoV-2 RNA is generally detectable in upper respiratory specimens during the acute phase of infection. The lowest concentration of SARS-CoV-2 viral copies this assay can detect is 138 copies/mL. A negative result does not preclude SARS-Cov-2 infection and should not be used as the sole basis for treatment or other patient management decisions. A negative result may occur with  improper specimen collection/handling, submission of specimen other than nasopharyngeal swab, presence of viral mutation(s) within the areas targeted by this assay, and inadequate number of viral copies(<138 copies/mL). A negative result must  be combined with clinical observations, patient history, and epidemiological information. The expected result is Negative.  Fact Sheet for Patients:  EntrepreneurPulse.com.au  Fact Sheet for Healthcare Providers:  IncredibleEmployment.be  This test is no t yet approved or cleared by the [place] FDA and  has been authorized for detection and/or diagnosis of SARS-CoV-2 by FDA under an Emergency Use Authorization (EUA). This EUA will remain  in effect (meaning this test can be used) for the duration of the COVID-19 declaration under Section 564(b)(1) of the Act, 21 U.S.C.section 360bbb-3(b)(1), unless the authorization is terminated  or revoked sooner.       Influenza A by PCR NEGATIVE NEGATIVE Final   Influenza B by PCR NEGATIVE NEGATIVE Final    Comment: (NOTE) The Xpert Xpress SARS-CoV-2/FLU/RSV plus assay is intended as an aid in the diagnosis of influenza from Nasopharyngeal swab specimens and should not be used as a sole basis for treatment. Nasal washings and aspirates are unacceptable for Xpert Xpress SARS-CoV-2/FLU/RSV testing.  Fact Sheet for Patients: EntrepreneurPulse.com.au  Fact Sheet for Healthcare Providers: IncredibleEmployment.be  This test is not yet approved or cleared by the [place] FDA and has been authorized for detection and/or diagnosis of SARS-CoV-2 by FDA under an Emergency Use Authorization (EUA). This EUA will remain in effect (meaning this test can be used) for the duration of the COVID-19 declaration under Section 564(b)(1) of the Act, 21 U.S.C. section 360bbb-3(b)(1), unless the authorization is terminated or revoked.  Performed at [facility], [address] [place], [place] [address]   MRSA Next Gen by PCR, Nasal     Status: None   Collection Time: 01/20/22  8:50 AM   Specimen: Nasal Mucosa; Nasal Swab  Result Value Ref Range Status   MRSA by  PCR Next Gen NOT DETECTED NOT DETECTED Final    Comment: (NOTE) The GeneXpert MRSA Assay (FDA approved for NASAL specimens only), is one component of a comprehensive MRSA colonization surveillance program. It is not intended to diagnose MRSA infection nor to guide or monitor treatment for MRSA infections. Test performance is not FDA approved in patients less than 31 years old. Performed at [facility], [address] [place], [place] [address]   Culture, Respiratory w Gram Stain     Status: None   Collection Time: 01/21/22  2:46 AM  Specimen: Tracheal Aspirate; Respiratory  Result Value Ref Range Status   Specimen Description   Final    TRACHEAL ASPIRATE Performed at [facility], [address] [place], [place] [address]    Special Requests   Final    NONE Performed at [facility], [address] [place], [place] [address]    Gram Stain   Final    ABUNDANT WBC PRESENT, PREDOMINANTLY PMN ABUNDANT GRAM POSITIVE COCCI IN CLUSTERS Performed at [facility], [place] [address], [place], [place] [address]    Culture ABUNDANT STAPHYLOCOCCUS AUREUS  Final   Report Status 01/23/2022 FINAL  Final   Organism ID, Bacteria STAPHYLOCOCCUS AUREUS  Final      Susceptibility   Staphylococcus aureus - MIC*    CIPROFLOXACIN <=0.5 SENSITIVE Sensitive     ERYTHROMYCIN <=0.25 SENSITIVE Sensitive     GENTAMICIN <=0.5 SENSITIVE Sensitive     OXACILLIN <=0.25 SENSITIVE Sensitive     TETRACYCLINE <=1 SENSITIVE Sensitive     VANCOMYCIN <=0.5 SENSITIVE Sensitive     TRIMETH/SULFA <=10 SENSITIVE Sensitive     CLINDAMYCIN <=0.25 SENSITIVE Sensitive     RIFAMPIN <=0.5 SENSITIVE Sensitive     Inducible Clindamycin NEGATIVE Sensitive     * ABUNDANT STAPHYLOCOCCUS AUREUS    Lipid Panel Recent Labs    01/24/22 0427  TRIG 223*     Studies/Results: MR ANGIO HEAD WO CONTRAST  Result Date: 01/24/2022 CLINICAL DATA:  Neuro deficit, stroke suspected EXAM: MRA  HEAD WITHOUT CONTRAST TECHNIQUE: Angiographic images of the Circle of Willis were acquired using MRA technique without intravenous contrast. COMPARISON:  No prior MRI, correlation is made with MRI 01/21/2022 FINDINGS: Anterior circulation: Both internal carotid arteries are patent to the termini, without stenosis or other abnormality. A1 segments patent. Normal anterior communicating artery. Anterior cerebral arteries are patent to their distal aspects. No M1 stenosis or occlusion. Normal MCA bifurcations. Distal MCA branches perfused and symmetric. Posterior circulation: Right dominant. Diminutive left V4 segment. Vertebral arteries patent to the vertebrobasilar junction without stenosis. Apparent discontinuity in the proximal V4 segments is favored to be secondary to motion artifact. Posterior inferior cerebral arteries patent bilaterally. Basilar patent to its distal aspect. Superior cerebellar arteries patent bilaterally. Bilateral P1 segments originate from the basilar artery. PCAs perfused to their distal aspects without stenosis. Bilateral posterior communicating arteries are visualized. Anatomic variants: None significant Other: None. IMPRESSION: No intracranial large vessel occlusion or significant stenosis. Electronically Signed   By: Merilyn Baba M.D.   On: 01/24/2022 01:10   MR BRAIN WO CONTRAST  Result Date: 01/24/2022 CLINICAL DATA:  Stroke suspected altered mental status and increased agitation EXAM: MRI HEAD WITHOUT CONTRAST TECHNIQUE: Multiplanar, multiecho pulse sequences of the brain and surrounding structures were obtained without intravenous contrast. COMPARISON:  01/21/2022 MRI FINDINGS: Brain: Numerous new foci of restricted diffusion in the bilateral cerebral hemispheres, which are new from the prior exam and demonstrate ADC correlates. Some of these are in the ACA-PCA watershed territory (series 5, image 33-35), while others are cortical, as seen in the right frontal lobe (series 5,  image 21, right temporal lobe (series 5, image 25 right occipital lobe (series 5, image 21), and left occipital lobe (series 5, image 23). Redemonstrated subacute infarcts seen on the prior exam, including in the left frontal lobe and right posterior temporal lobe. No acute hemorrhage, mass, mass effect, or midline shift. No hydrocephalus or extra-axial collection. Vascular: Normal flow voids. Skull and upper cervical spine: Normal marrow signal.  Sinuses/Orbits: Mucosal thickening in the sphenoid sinuses, with air-fluid level in the left sphenoid sinus. The sinuses are otherwise largely clear. The orbits are unremarkable. Other: Fluid throughout the bilateral mastoid air cells. IMPRESSION: 1. 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 Given multiple vascular territories, a central embolic etiology should be considered. 2. Air-fluid level in the left sphenoid sinus, as can be seen with acute sinusitis. Correlate with symptoms. Impression #1 will be called to the ordering clinician or representative by the Radiologist Assistant, and communication documented in the PACS or [employer]. Electronically Signed   By: Merilyn Baba M.D.   On: 01/24/2022 23:33   EEG adult  Result Date: 01/24/2022 Derek Jack, MD     01/24/2022  7:50 PM Routine EEG Report Veva Gizell Danser is a 66 y.o. female with a history of severe encephalopathy who is undergoing an EEG to evaluate for seizures. Report: This EEG was acquired with electrodes placed according to the International 10-20 electrode system (including Fp1, Fp2, F3, F4, C3, C4, P3, P4, O1, O2, T3, T4, T5, T6, A1, A2, Fz, Cz, Pz). The following electrodes were missing or displaced: none. There was no clear waking rhythm and no sleep architecture. Best background was generalized 5-7 Hz theta  slowing with intermittent 2-3 Hz delta slowing. There was no focal slowing. There were no interictal epileptiform discharges. There were no electrographic seizures identified. Photic stimulation and hyperventilation were not performed. Impression and clinical correlation: This EEG was obtained while sedated on fentanyl and comatose and was abnormal due to moderate-to-severe diffuse slowing. Epileptiform abnormalities were not seen during this recording. Su Monks, MD [facility] [phone] If 7pm- 7am, please page neurology on call as listed in [place].    Medications: Scheduled:  albuterol  2.5 mg Nebulization Q6H   aspirin  81 mg Per Tube Daily   chlorhexidine gluconate (MEDLINE KIT)  15 mL Mouth Rinse BID   Chlorhexidine Gluconate Cloth  6 each Topical Q0600   docusate  100 mg Per Tube BID   feeding supplement (PROSource TF)  90 mL Per Tube TID   free water  30 mL Per Tube Q4H   heparin  5,000 Units Subcutaneous Q8H   insulin aspart  0-20 Units Subcutaneous Q4H   insulin glargine-yfgn  8 Units Subcutaneous BID   [START ON 01/26/2022] levothyroxine  200 mcg Per Tube Q0600   mouth rinse  15 mL Mouth Rinse 10 times per day   midodrine  10 mg Per Tube TID WC   multivitamin  1 tablet Per Tube QHS   pantoprazole (PROTONIX) IV  40 mg Intravenous Daily   polyethylene glycol  17 g Per Tube Daily   sodium chloride flush  10-40 mL Intracatheter Q12H   Continuous:  sodium chloride     feeding supplement (VITAL AF 1.2 CAL) 1,000 mL (01/25/22 0313)   norepinephrine (LEVOPHED) Adult infusion 4 mcg/min (01/25/22 1600)   propofol (DIPRIVAN) infusion 25 mcg/kg/min (01/25/22 1746)   vancomycin Stopped (01/23/22 1905)    Assessment: 66 year old female who presented to the hospital with severe metabolic derangements in the setting of missed hemodialysis sessions. She was subsequently intubated for respiratory distress. She has had persistent AMS since admission. 1. Exam continues to show  findings consistent with diffuse cerebral hypofunction and do not improve after holding sedation 2.  Agree with Psychiatry that metabolic abnormalities due to failure to keep up with dialysis are most likely contributing to the patient's AMS. Also agree with possible contribution from severe hypothyroidism.   3. Head CT: No acute intracranial abnormality. Mild chronic small vessel ischemia 4. EEG 1/28: Finding of continuous generalized slowing is suggestive of moderate diffuse encephalopathy, nonspecific to etiology. No seizures or epileptiform discharges were seen throughout the recording. 5. MRI brain 1/28: Punctate acute to subacute infarcts in the left inferior frontal gyrus, left occipital lobe, and right temporal lobe periventricular white matter. No associated hemorrhage or mass effect. Mild chronic white matter microangiopathy. 6. Although the tiny foci of acute infarction are not felt to be contributing to her AMS, will need stroke work up.  7. TTE showed no intracardiac clot. Bilateral infarcts c/w central embolic source, but given pressor requirements will hold off on TEE at this time.  8. MRA head no hemodynamically-significant stenoses  MRI brain 1/31 showed numerous new acute infarcts bilaterally. I had a long discussion about this with her husband tonight by phone, and explained the burden of the new strokes is much more significant and markedly worsen her chance of a meaningful recovery from a neurologic standpoint. Plan for family meeting with neurology tomorrow at 1100 to discuss further.    Additional recommendations: Permissive HTN if able to do so - patient has required pressors; avoid hypotension if at all possible in the setting of multiple new acute infarcts Continue ASA 22m daily; add plavix 768mdaily if not contraindicated Family meeting tmrw to discuss GOMcBainBilateral infarcts suggest central embolic source but patient not stable for TEE at this time and further workup for  this may not be c/w family's GOC - will reassess tmrw   CoSu MonksMD [facility] [phone] 7pm- 7am, please page neurology on call as listed in [place]

## 2022-01-25 NOTE — Progress Notes (Signed)
[facility], [place] 01/25/22  Subjective:   LOS: 8  Patient seen and evaluated at bedside. Still critically ill. Appears to have new strokes. Due for dialysis treatment today. Still on the ventilator.  Objective:  Vital signs in last 24 hours:  Temp:  [98.3 F (36.8 C)-99.4 F (37.4 C)] 99.4 F (37.4 C) (02/01 0745) Pulse Rate:  [83-108] 92 (02/01 0932) Resp:  [18-31] 22 (02/01 0932) BP: (84-153)/(30-60) 125/46 (02/01 0932) SpO2:  [95 %-100 %] 97 % (02/01 0932) FiO2 (%):  [40 %] 40 % (02/01 0920)  Weight change:  Filed Weights   01/20/22 1752 01/23/22 1531 01/23/22 1935  Weight: 114.4 kg 121 kg 119.4 kg    Intake/Output:    Intake/Output Summary (Last 24 hours) at 01/25/2022 0947 Last data filed at 01/25/2022 4098 Gross per 24 hour  Intake 1873.78 ml  Output 336 ml  Net 1537.78 ml      Physical Exam: General: Critically ill-appearing  HEENT Endotracheal tube in place  Pulm/lungs Scattered rhonchi, vent assisted  CVS/Heart S1S2 no rub  Abdomen:  Soft, nontender  Extremities: Trace lower extremity edema  Neurologic: Intubated, not following commands  Skin: Warm, dry  Access: Left arm AV fistula       Basic Metabolic Panel:  Recent Labs  Lab 01/21/22 0530 01/21/22 0537 01/22/22 0340 01/23/22 0407 01/24/22 0427 01/25/22 0422  NA 137  --  132* 135 130* 127*  K 3.6  --  3.6 3.7 3.5 3.6  CL 105  --  101 99 93* 90*  CO2 24  --  20* $Re'22 26 22  'pyJ$ GLUCOSE 154*  --  230* 235* 289* 357*  BUN 33*  --  58* 86* 58* 98*  CREATININE 4.95*  --  6.22* 7.47* 5.14* 6.06*  CALCIUM 8.5*  --  8.8* 8.8* 8.7* 8.9  MG  --  1.8 1.8 1.7 1.7 2.2  PHOS 4.5  --  6.3* 7.1* 6.0* 5.7*      CBC: Recent Labs  Lab 01/19/22 0918 01/20/22 0417 01/20/22 1058 01/21/22 0537 01/22/22 0340 01/23/22 0407 01/24/22 0427 01/25/22 0422  WBC 14.0* 17.7*   < > 15.1* 17.8* 20.2* 24.0* 21.3*  NEUTROABS 12.1* 15.3*  --   --   --   --   --  17.7*  HGB 10.2*  9.9*   < > 8.5* 8.3* 8.6* 8.2* 7.5*  HCT 31.6* 31.5*   < > 27.7* 27.1* 27.6* 25.4* 23.6*  MCV 91.1 92.6   < > 96.2 96.4 97.5 94.8 94.4  PLT 260 285   < > 216 170 183 196 206   < > = values in this interval not displayed.       Lab Results  Component Value Date   HEPBSAG NON REACTIVE 01/20/2022   HEPBSAB Reactive (A) 01/20/2022      Microbiology:  Recent Results (from the past 240 hour(s))  Resp Panel by RT-PCR (Flu A&B, Covid) Nasopharyngeal Swab     Status: None   Collection Time: 01/14/2022  8:30 PM   Specimen: Nasopharyngeal Swab; Nasopharyngeal(NP) swabs in vial transport medium  Result Value Ref Range Status   SARS Coronavirus 2 by RT PCR NEGATIVE NEGATIVE Final    Comment: (NOTE) SARS-CoV-2 target nucleic acids are NOT DETECTED.  The SARS-CoV-2 RNA is generally detectable in upper respiratory specimens during the acute phase of infection. The lowest concentration of SARS-CoV-2 viral copies this assay can detect is 138 copies/mL. A negative result does not preclude SARS-Cov-2  infection and should not be used as the sole basis for treatment or other patient management decisions. A negative result may occur with  improper specimen collection/handling, submission of specimen other than nasopharyngeal swab, presence of viral mutation(s) within the areas targeted by this assay, and inadequate number of viral copies(<138 copies/mL). A negative result must be combined with clinical observations, patient history, and epidemiological information. The expected result is Negative.  Fact Sheet for Patients:  EntrepreneurPulse.com.au  Fact Sheet for Healthcare Providers:  IncredibleEmployment.be  This test is no t yet approved or cleared by the [place] FDA and  has been authorized for detection and/or diagnosis of SARS-CoV-2 by FDA under an Emergency Use Authorization (EUA). This EUA will remain  in effect (meaning this test can be  used) for the duration of the COVID-19 declaration under Section 564(b)(1) of the Act, 21 U.S.C.section 360bbb-3(b)(1), unless the authorization is terminated  or revoked sooner.       Influenza A by PCR NEGATIVE NEGATIVE Final   Influenza B by PCR NEGATIVE NEGATIVE Final    Comment: (NOTE) The Xpert Xpress SARS-CoV-2/FLU/RSV plus assay is intended as an aid in the diagnosis of influenza from Nasopharyngeal swab specimens and should not be used as a sole basis for treatment. Nasal washings and aspirates are unacceptable for Xpert Xpress SARS-CoV-2/FLU/RSV testing.  Fact Sheet for Patients: EntrepreneurPulse.com.au  Fact Sheet for Healthcare Providers: IncredibleEmployment.be  This test is not yet approved or cleared by the [place] FDA and has been authorized for detection and/or diagnosis of SARS-CoV-2 by FDA under an Emergency Use Authorization (EUA). This EUA will remain in effect (meaning this test can be used) for the duration of the COVID-19 declaration under Section 564(b)(1) of the Act, 21 U.S.C. section 360bbb-3(b)(1), unless the authorization is terminated or revoked.  Performed at [facility], [address] [place], [place] [address]   MRSA Next Gen by PCR, Nasal     Status: None   Collection Time: 01/20/22  8:50 AM   Specimen: Nasal Mucosa; Nasal Swab  Result Value Ref Range Status   MRSA by PCR Next Gen NOT DETECTED NOT DETECTED Final    Comment: (NOTE) The GeneXpert MRSA Assay (FDA approved for NASAL specimens only), is one component of a comprehensive MRSA colonization surveillance program. It is not intended to diagnose MRSA infection nor to guide or monitor treatment for MRSA infections. Test performance is not FDA approved in patients less than 30 years old. Performed at [facility], [address] [place], [place] [address]   Culture, Respiratory w Gram Stain     Status: None    Collection Time: 01/21/22  2:46 AM   Specimen: Tracheal Aspirate; Respiratory  Result Value Ref Range Status   Specimen Description   Final    TRACHEAL ASPIRATE Performed at [facility], [address] [place], [place] [address]    Special Requests   Final    NONE Performed at [facility], [address] [place], [place] [address]    Gram Stain   Final    ABUNDANT WBC PRESENT, PREDOMINANTLY PMN ABUNDANT GRAM POSITIVE COCCI IN CLUSTERS Performed at [facility], [place] [address] [place], [place] [address]    Culture ABUNDANT STAPHYLOCOCCUS AUREUS  Final   Report Status 01/23/2022 FINAL  Final   Organism ID, Bacteria STAPHYLOCOCCUS AUREUS  Final      Susceptibility   Staphylococcus aureus - MIC*    CIPROFLOXACIN <=0.5 SENSITIVE Sensitive  ERYTHROMYCIN <=0.25 SENSITIVE Sensitive     GENTAMICIN <=0.5 SENSITIVE Sensitive     OXACILLIN <=0.25 SENSITIVE Sensitive     TETRACYCLINE <=1 SENSITIVE Sensitive     VANCOMYCIN <=0.5 SENSITIVE Sensitive     TRIMETH/SULFA <=10 SENSITIVE Sensitive     CLINDAMYCIN <=0.25 SENSITIVE Sensitive     RIFAMPIN <=0.5 SENSITIVE Sensitive     Inducible Clindamycin NEGATIVE Sensitive     * ABUNDANT STAPHYLOCOCCUS AUREUS    Coagulation Studies: No results for input(s): LABPROT, INR in the last 72 hours.  Urinalysis: No results for input(s): COLORURINE, LABSPEC, PHURINE, GLUCOSEU, HGBUR, BILIRUBINUR, KETONESUR, PROTEINUR, UROBILINOGEN, NITRITE, LEUKOCYTESUR in the last 72 hours.  Invalid input(s): APPERANCEUR     Imaging: MR ANGIO HEAD WO CONTRAST  Result Date: 01/24/2022 CLINICAL DATA:  Neuro deficit, stroke suspected EXAM: MRA HEAD WITHOUT CONTRAST TECHNIQUE: Angiographic images of the Circle of Willis were acquired using MRA technique without intravenous contrast. COMPARISON:  No prior MRI, correlation is made with MRI 01/21/2022 FINDINGS: Anterior circulation: Both internal carotid arteries are patent to the termini,  without stenosis or other abnormality. A1 segments patent. Normal anterior communicating artery. Anterior cerebral arteries are patent to their distal aspects. No M1 stenosis or occlusion. Normal MCA bifurcations. Distal MCA branches perfused and symmetric. Posterior circulation: Right dominant. Diminutive left V4 segment. Vertebral arteries patent to the vertebrobasilar junction without stenosis. Apparent discontinuity in the proximal V4 segments is favored to be secondary to motion artifact. Posterior inferior cerebral arteries patent bilaterally. Basilar patent to its distal aspect. Superior cerebellar arteries patent bilaterally. Bilateral P1 segments originate from the basilar artery. PCAs perfused to their distal aspects without stenosis. Bilateral posterior communicating arteries are visualized. Anatomic variants: None significant Other: None. IMPRESSION: No intracranial large vessel occlusion or significant stenosis. Electronically Signed   By: Merilyn Baba M.D.   On: 01/24/2022 01:10   MR BRAIN WO CONTRAST  Result Date: 01/24/2022 CLINICAL DATA:  Stroke suspected altered mental status and increased agitation EXAM: MRI HEAD WITHOUT CONTRAST TECHNIQUE: Multiplanar, multiecho pulse sequences of the brain and surrounding structures were obtained without intravenous contrast. COMPARISON:  01/21/2022 MRI FINDINGS: Brain: Numerous new foci of restricted diffusion in the bilateral cerebral hemispheres, which are new from the prior exam and demonstrate ADC correlates. Some of these are in the ACA-PCA watershed territory (series 5, image 33-35), while others are cortical, as seen in the right frontal lobe (series 5, image 21, right temporal lobe (series 5, image 25 right occipital lobe (series 5, image 21), and left occipital lobe (series 5, image 23). Redemonstrated subacute infarcts seen on the prior exam, including in the left frontal lobe and right posterior temporal lobe. No acute hemorrhage, mass, mass  effect, or midline shift. No hydrocephalus or extra-axial collection. Vascular: Normal flow voids. Skull and upper cervical spine: Normal marrow signal. Sinuses/Orbits: Mucosal thickening in the sphenoid sinuses, with air-fluid level in the left sphenoid sinus. The sinuses are otherwise largely clear. The orbits are unremarkable. Other: Fluid throughout the bilateral mastoid air cells. IMPRESSION: 1. 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 Given multiple vascular territories, a central embolic etiology should be considered. 2. Air-fluid level in the left sphenoid sinus, as can be seen with acute sinusitis. Correlate with symptoms. Impression #1 will be called to the ordering clinician or representative by the Radiologist Assistant, and  communication documented in the PACS or [employer]. Electronically Signed   By: Merilyn Baba M.D.   On: 01/24/2022 23:33   EEG adult  Result Date: 01/24/2022 Derek Jack, MD     01/24/2022  7:50 PM Routine EEG Report Regina Jackson is a 66 y.o. female with a history of severe encephalopathy who is undergoing an EEG to evaluate for seizures. Report: This EEG was acquired with electrodes placed according to the International 10-20 electrode system (including Fp1, Fp2, F3, F4, C3, C4, P3, P4, O1, O2, T3, T4, T5, T6, A1, A2, Fz, Cz, Pz). The following electrodes were missing or displaced: none. There was no clear waking rhythm and no sleep architecture. Best background was generalized 5-7 Hz theta slowing with intermittent 2-3 Hz delta slowing. There was no focal slowing. There were no interictal epileptiform discharges. There were no electrographic seizures identified. Photic stimulation and hyperventilation were not performed. Impression and clinical correlation: This EEG was obtained while  sedated on fentanyl and comatose and was abnormal due to moderate-to-severe diffuse slowing. Epileptiform abnormalities were not seen during this recording. Su Monks, MD [facility] [phone] If 7pm- 7am, please page neurology on call as listed in [place].     Medications:    sodium chloride     feeding supplement (VITAL AF 1.2 CAL) 1,000 mL (01/25/22 0313)   norepinephrine (LEVOPHED) Adult infusion 2 mcg/min (01/24/22 1453)   vancomycin Stopped (01/23/22 1905)    albuterol  2.5 mg Nebulization Q6H   aspirin  81 mg Per Tube Daily   chlorhexidine gluconate (MEDLINE KIT)  15 mL Mouth Rinse BID   Chlorhexidine Gluconate Cloth  6 each Topical Q0600   docusate  100 mg Per Tube BID   epoetin (EPOGEN/PROCRIT) injection  4,000 Units Intravenous Q M,W,F-HD   feeding supplement (PROSource TF)  90 mL Per Tube TID   free water  30 mL Per Tube Q4H   heparin  5,000 Units Subcutaneous Q8H   hydrocortisone sod succinate (SOLU-CORTEF) inj  100 mg Intravenous Q8H   insulin aspart  0-20 Units Subcutaneous Q4H   insulin glargine-yfgn  8 Units Subcutaneous BID   levothyroxine  100 mcg Intravenous Daily   mouth rinse  15 mL Mouth Rinse 10 times per day   midodrine  10 mg Per Tube TID WC   multivitamin  1 tablet Per Tube QHS   pantoprazole (PROTONIX) IV  40 mg Intravenous Daily   polyethylene glycol  17 g Per Tube Daily   sodium chloride flush  10-40 mL Intracatheter Q12H   sodium chloride, acetaminophen **OR** acetaminophen, alteplase, fentaNYL (SUBLIMAZE) injection, heparin, lidocaine (PF), lidocaine-prilocaine, midazolam, ondansetron **OR** ondansetron (ZOFRAN) IV, pentafluoroprop-tetrafluoroeth, sodium chloride flush, vancomycin  Assessment/ Plan:  66 y.o. female with medical problems of  End-stage renal disease, on dialysis since 2015, hypothyroidism, hyperlipidemia, diabetes, peripheral neuropathy, morbid obesity, mono, gammopathy, ischemic optic neuropathy, regarding oral thrush,  COVID-19 in September 2022 history of endometrial carcinoma, obstructive sleep apnea was admitted on 01/10/2022 for  Principal Problem:   Acute metabolic encephalopathy Active Problems:   Chronic home hemodialysis status (HCC)   Hypertensive urgency   Non-compliance with renal dialysis (HCC)   Anemia of chronic kidney failure, stage 5 (HCC)   Elevated troponin   Fluid overload   Involuntary commitment   Type 2 diabetes mellitus with kidney complication, with long-term current use of insulin (HCC)   Hypothyroidism   Endometrial cancer ([place])   H/O radioactive iodine thyroid ablation   Acute respiratory  failure ([place])   Endotracheally intubated   On mechanically assisted ventilation (HCC)  Acute metabolic encephalopathy [J79.07]  #. ESRD, with hyperkalemia Patient due for hemodialysis treatment today.  Orders have been prepared.  Ultrafiltration target 1.5 kg.  Hemoglobin down to 7.5.  No immediate need for transfusion but consider if hemoglobin drops to 7 or less.  #. Anemia of CKD  Lab Results  Component Value Date   HGB 7.5 (L) 01/25/2022   Continue Epogen 4000 units IV with dialysis treatments.  Hemoglobin currently 8.2.  #. Secondary hyperparathyroidism of renal origin N 25.81      Component Value Date/Time   PTH 492 (H) 01/20/2022 1304   Lab Results  Component Value Date   PHOS 5.7 (H) 01/25/2022   Serum phosphorus down a bit further to 5.7.  Continue to monitor bone mineral metabolism parameters.   #. Diabetes type 2 with CKD Hgb A1c MFr Bld (%)  Date Value  01/18/2022 9.2 (H)  Plan as hospitalist team  #Severe hypothyroidism, myxedema coma Patient transitioned to PO synthroid.    LOS: 8 Chihiro Frey 2/1/20239:47 [place], [place]

## 2022-01-25 NOTE — Progress Notes (Signed)
Pt transported to [facility] and returned on the vent without incident. Pt remains on the vent and is tol well at this time.

## 2022-01-25 DEATH — deceased

## 2022-01-26 ENCOUNTER — Inpatient Hospital Stay: Payer: BLUE CROSS/BLUE SHIELD

## 2022-01-26 DIAGNOSIS — I6782 Cerebral ischemia: Secondary | ICD-10-CM | POA: Diagnosis not present

## 2022-01-26 DIAGNOSIS — I2609 Other pulmonary embolism with acute cor pulmonale: Secondary | ICD-10-CM

## 2022-01-26 DIAGNOSIS — G939 Disorder of brain, unspecified: Secondary | ICD-10-CM

## 2022-01-26 LAB — GLUCOSE, CAPILLARY
Glucose-Capillary: 285 mg/dL — ABNORMAL HIGH (ref 70–99)
Glucose-Capillary: 367 mg/dL — ABNORMAL HIGH (ref 70–99)
Glucose-Capillary: 302 mg/dL — ABNORMAL HIGH (ref 70–99)
Glucose-Capillary: 302 mg/dL — ABNORMAL HIGH (ref 70–99)
Glucose-Capillary: 277 mg/dL — ABNORMAL HIGH (ref 70–99)
Glucose-Capillary: 257 mg/dL — ABNORMAL HIGH (ref 70–99)

## 2022-01-26 LAB — CBC
HCT: 23.8 % — ABNORMAL LOW (ref 36.0–46.0)
Hemoglobin: 7.7 g/dL — ABNORMAL LOW (ref 12.0–15.0)
MCH: 30.4 pg (ref 26.0–34.0)
MCHC: 32.4 g/dL (ref 30.0–36.0)
MCV: 94.1 fL (ref 80.0–100.0)
Platelets: 223 10*3/uL (ref 150–400)
RBC: 2.53 MIL/uL — ABNORMAL LOW (ref 3.87–5.11)
RDW: 14.1 % (ref 11.5–15.5)
WBC: 29.3 10*3/uL — ABNORMAL HIGH (ref 4.0–10.5)
nRBC: 1 % — ABNORMAL HIGH (ref 0.0–0.2)

## 2022-01-26 LAB — TRIGLYCERIDES: Triglycerides: 532 mg/dL — ABNORMAL HIGH (ref <150)

## 2022-01-26 LAB — VANCOMYCIN, RANDOM: Vancomycin Rm: 14

## 2022-01-26 LAB — RENAL FUNCTION PANEL
Albumin: 1.8 g/dL — ABNORMAL LOW (ref 3.5–5.0)
Anion gap: 12 (ref 5–15)
BUN: 69 mg/dL — ABNORMAL HIGH (ref 8–23)
CO2: 23 mmol/L (ref 22–32)
Calcium: 8.4 mg/dL — ABNORMAL LOW (ref 8.9–10.3)
Chloride: 96 mmol/L — ABNORMAL LOW (ref 98–111)
Creatinine, Ser: 3.96 mg/dL — ABNORMAL HIGH (ref 0.44–1.00)
GFR, Estimated: 12 mL/min — ABNORMAL LOW (ref >60)
Glucose, Bld: 313 mg/dL — ABNORMAL HIGH (ref 70–99)
Phosphorus: 3.2 mg/dL (ref 2.5–4.6)
Potassium: 2.9 mmol/L — ABNORMAL LOW (ref 3.5–5.1)
Sodium: 131 mmol/L — ABNORMAL LOW (ref 135–145)

## 2022-01-26 IMAGING — DX DG CHEST 1V PORT
1 series · 2 of 2 positions shown · non-contrast
Comparison: [DATE].

CLINICAL DATA: Acute respiratory failure with hypoxia.

EXAM:
PORTABLE CHEST 1 VIEW

[Series 1: chest ap · 0.14mm/px · 2 of 2 slices shown]
[im 1/2]
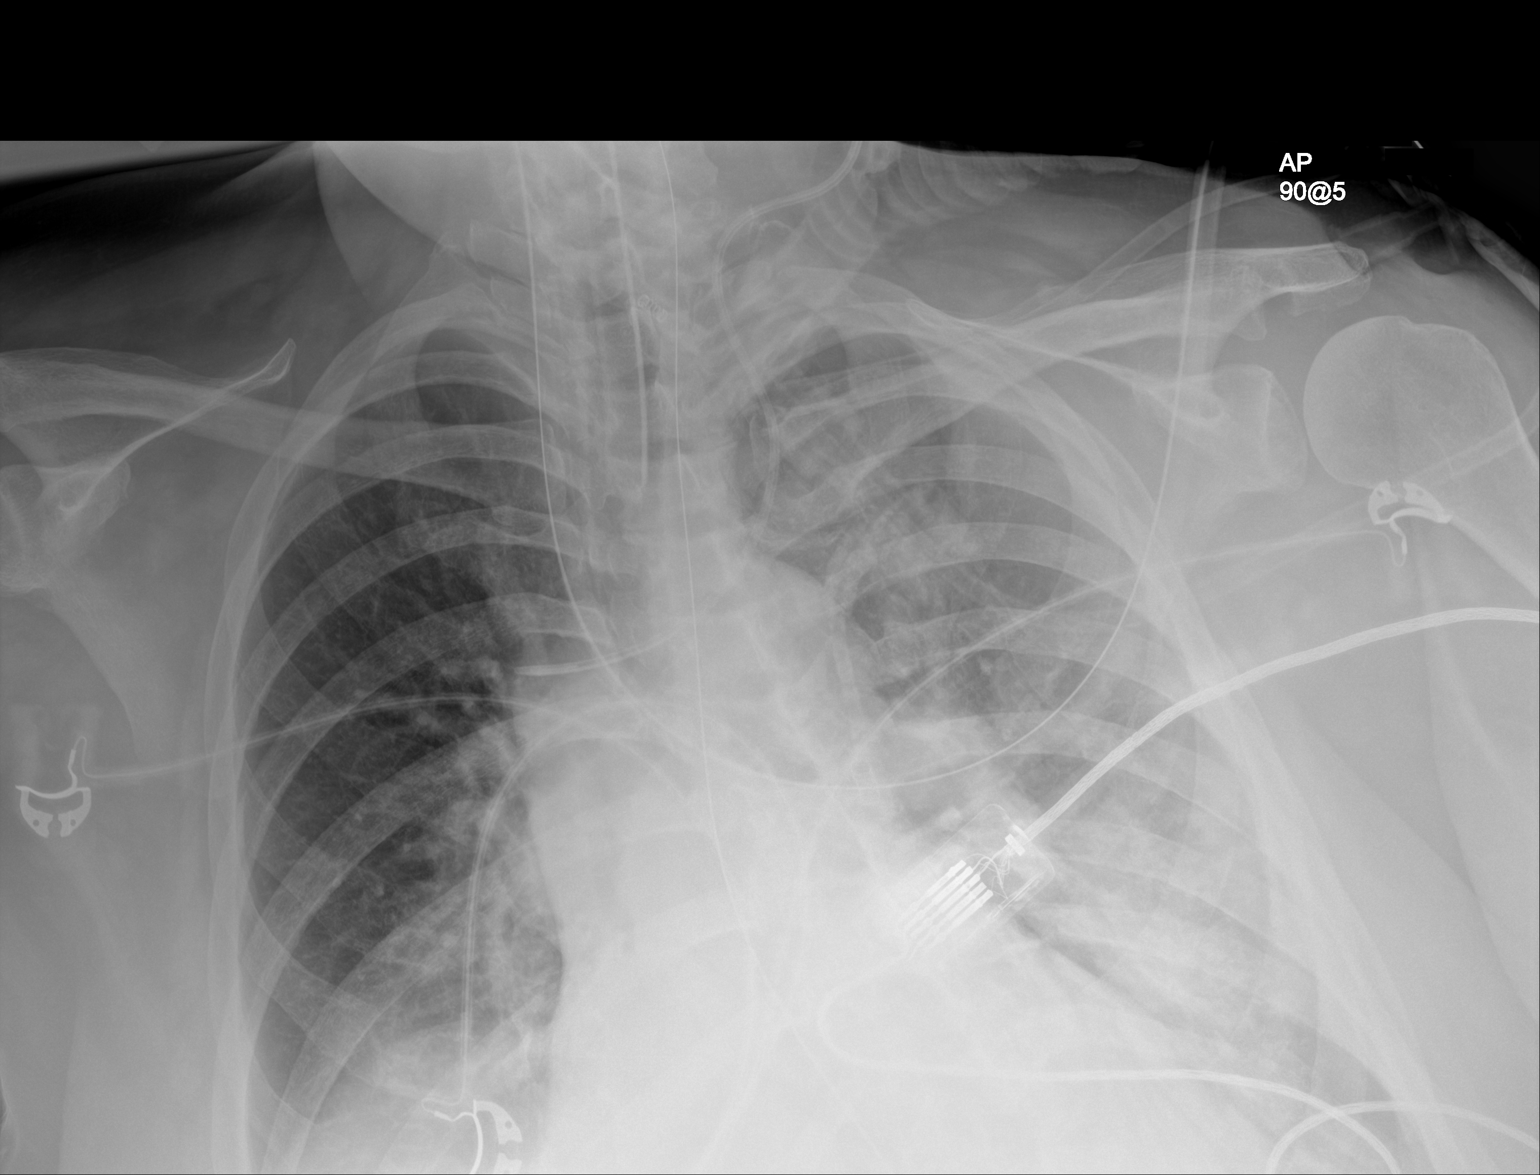
[im 2/2]
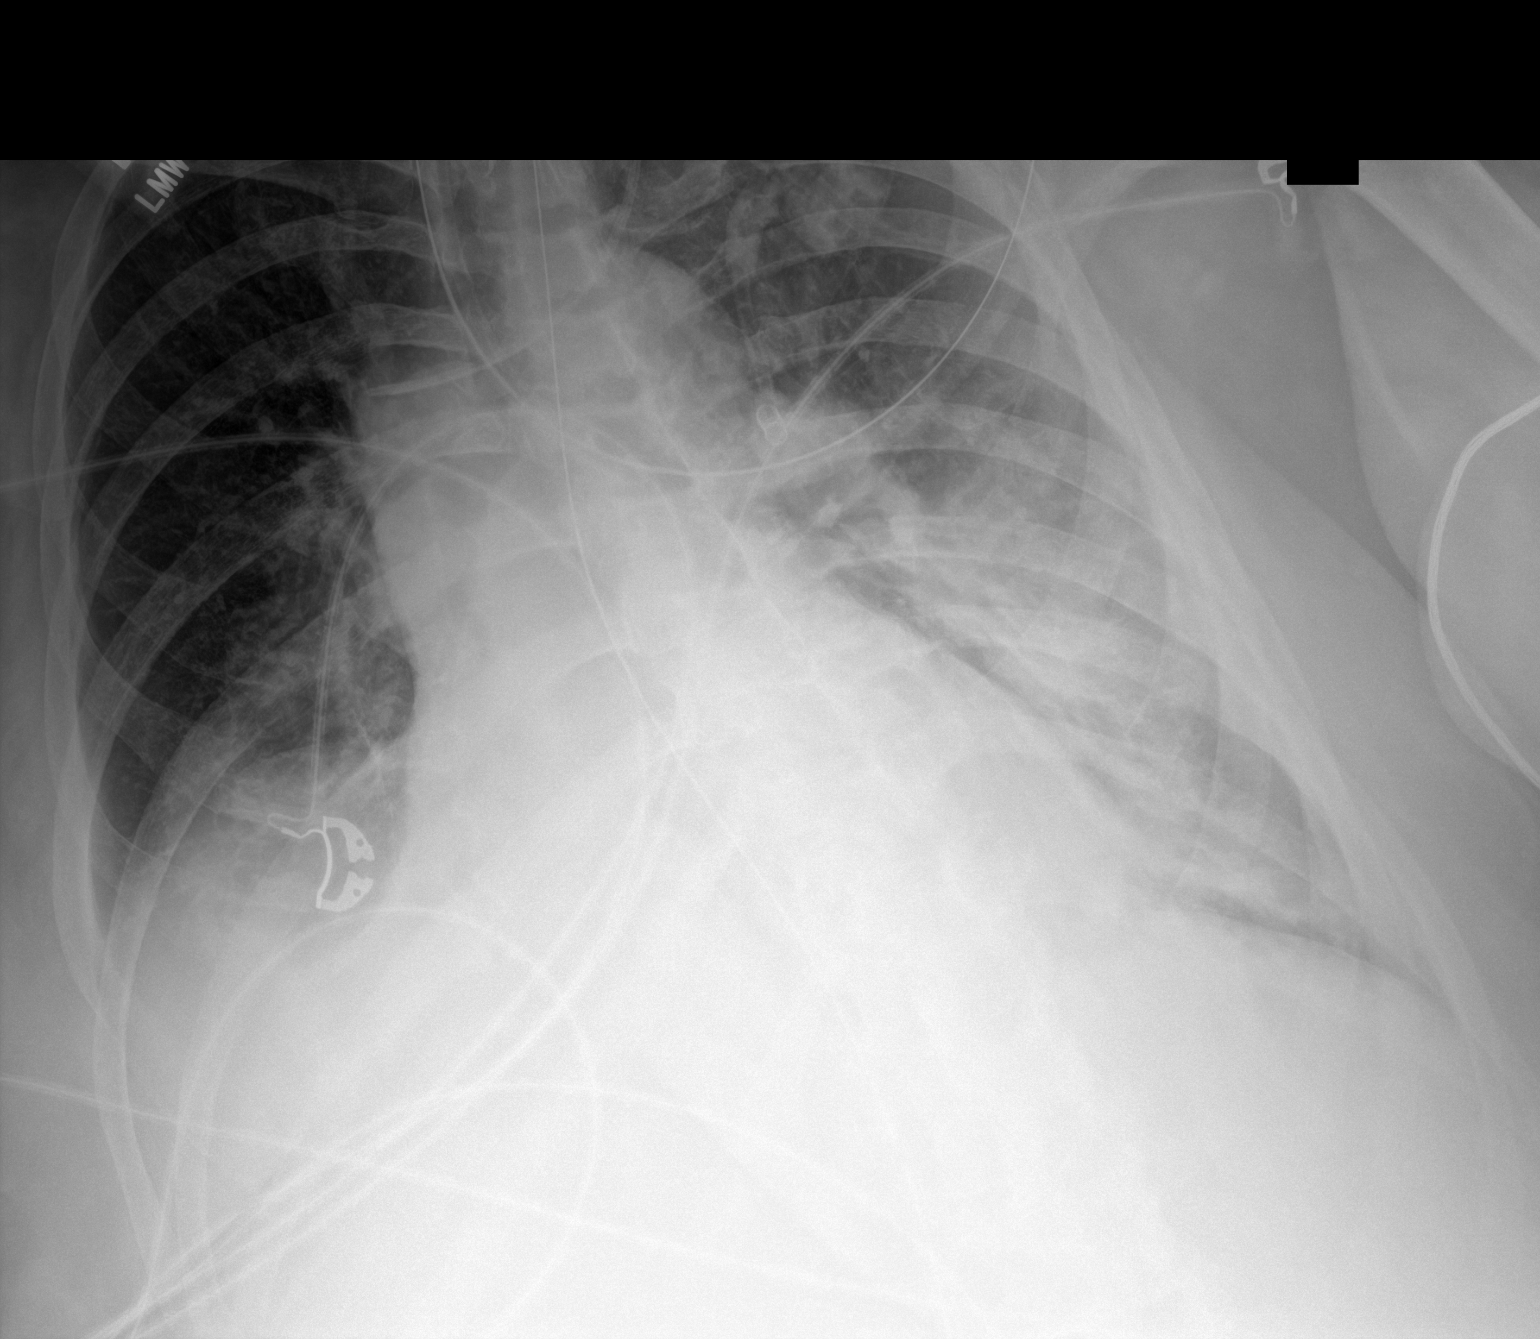

[2 of 2 positions shown; findings below may reference images not displayed]

FINDINGS: Patient rotated to the right. Endotracheal tube tip at the level of
the clavicular heads. Gastric tube courses below the diaphragm with
the tip outside the field of view. Hazy left basilar opacities.
Cardiomediastinal silhouette is similar. Similar position of a left
IJ central venous catheter with tip projecting in the region of the
proximal SVC.
IMPRESSION: Hazy left basilar opacities, which may represent layering pleural
effusion with overlying atelectasis and/or pneumonia.

## 2022-01-26 MED ORDER — FENTANYL BOLUS VIA INFUSION
50.0000 ug | INTRAVENOUS | Status: DC | PRN
  Filled 2022-01-26: qty 100

## 2022-01-26 MED ORDER — FENTANYL 2500MCG IN NS 250ML (10MCG/ML) PREMIX INFUSION
50.0000 ug/h | INTRAVENOUS | Status: DC
  Administered 2022-01-26: 200 ug/h via INTRAVENOUS
  Administered 2022-01-26: 50 ug/h via INTRAVENOUS
  Administered 2022-01-27 (×2): 200 ug/h via INTRAVENOUS
  Filled 2022-01-26 (×4): qty 250

## 2022-01-26 MED ORDER — VANCOMYCIN HCL IN DEXTROSE 1-5 GM/200ML-% IV SOLN
1000.0000 mg | Freq: Once | INTRAVENOUS | Status: AC
  Administered 2022-01-26: 1000 mg via INTRAVENOUS
  Filled 2022-01-26: qty 200

## 2022-01-26 MED ORDER — FENTANYL CITRATE PF 50 MCG/ML IJ SOSY
50.0000 ug | PREFILLED_SYRINGE | Freq: Once | INTRAMUSCULAR | Status: AC
  Administered 2022-01-26: 50 ug via INTRAVENOUS

## 2022-01-26 NOTE — Progress Notes (Signed)
Palliative:  HPI: 66 y.o. female  with past medical history of ESRD on home hemodialysis 3x weekly (still produces urine), hypothyroidism (s/p radioactive iodine thyroid ablation), anemia related to chronic renal disease, endometrial cancer (s/p surgery and chemo 2021 and no signs of recurrence), diabetes, hypertension admitted on 01/03/2022 with altered mental status and agitation with noted non-adherence to dialysis for 8 days per family (unclear if she was taking medication). Patient is notably under IVC. 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    I met today with Damyah's family - husband, 2 children with their significant others, brother and sister-in-law, and best friend. I was joined later in conversation by Dr. Mortimer Fries. Discussed with family course of illness, poor neurological status, extensive strokes, and overall poor prognosis. We reviewed Danely's expected quality of life and path forward. Discussed plans for comfort care and symptom management at end of life. Discussed prognosis off ventilator as poor and likely hours to day. They had many good questions that were addressed. They all agree that Jeronica would not want to live like this. They will continue family discussions regarding timing of when to make transition and proceed with one way extubation. Seems there may be more family that may want to visit. Encouraged them to consider what is best for Jaretssi and her level of suffering.   All questions/concerns addressed. Emotional support provided.  Exam: Unresponsive. Tolerating vent support. Requiring prn sedation due to tachypnea/labored breathing with use of accessory muscles. HR 90s. Abd soft. Warm to touch.   Plan: - DNR - One way extubation to comfort but family deciding on timing and when to proceed - Spiritual care to offer additional family support  45 min  Vinie Sill, NP Palliative Medicine Team Pager [phone]  (Please see [email or web address] for schedule) Team Phone [phone]    Greater than 50%  of this time was spent counseling and coordinating care related to the above assessment and plan

## 2022-01-26 NOTE — Progress Notes (Signed)
Inpatient Diabetes Program Recommendations  AACE/ADA: New Consensus Statement on Inpatient Glycemic Control   Target Ranges:  Prepandial:   less than 140 mg/dL      Peak postprandial:   less than 180 mg/dL (1-2 hours)      Critically ill patients:  140 - 180 mg/dL    Latest Reference Range & Units 01/26/22 03:29 01/26/22 07:21  Glucose-Capillary 70 - 99 mg/dL 285 (H) 367 (H)    Latest Reference Range & Units 01/25/22 07:52 01/25/22 11:33 01/25/22 16:50 01/25/22 19:24 01/25/22 23:33  Glucose-Capillary 70 - 99 mg/dL 348 (H) 253 (H) 246 (H) 240 (H) 257 (H)    Review of Glycemic Control  Diabetes history: DM2 Outpatient Diabetes medications: Lantus 80 units daily, Humalog 14-18 units TID with meals, Ozempic 1 mg Qweek (per PCP visit on 07/29/21) Current orders for Inpatient glycemic control: Semglee 8 units BID, Novolog 0-20 units Q4H; Vital @ 55 ml/hr  Inpatient Diabetes Program Recommendations:    Insulin: Glucose has ranged from 240-367 mg/dl over the past 24 hours. Recommend discontinuing all insulin orders and use ICU Glycemic Control order set Phase 2 (IV insulin).  Thanks, Barnie Alderman, RN, MSN, CDE Diabetes Coordinator Inpatient Diabetes Program [phone] (Team Pager from 8am to 5pm)

## 2022-01-26 NOTE — Consult Note (Signed)
Pharmacy Antibiotic Note  Regina Jackson is a 65 y.o. female with medical history including ESRD on HD, hypothyroidism, anemia of chronic disease, endometrial cancer, HTN, diabetes admitted on 01/05/2022 with  acute metabolic encephalopathy in setting of severe metabolic derangements s/t missed hemodialysis, hypothyroidism . Patient ultimately developed acute respiratory distress requiring intubation and mechanical ventilation.  Pharmacy has been consulted for vancomycin dosing.  Plan: --Vancomycin dose not given yesterday with HD as planned.  --Vancomycin level today 14 --Will give vancomycin 1 g dose today --Will evaluate further vancomycin dosing tomorrow as HD plan unclear, family is considering comfort care per nephrology and palliative care team   Height: 5' 7.52" (171.5 cm) Weight: 117.5 kg (259 lb 0.7 oz) IBW/kg (Calculated) : 62.8  Temp (24hrs), Avg:99.6 F (37.6 C), Min:99.1 F (37.3 C), Max:100.1 F (37.8 C)  Recent Labs  Lab 01/22/22 0340 01/23/22 0407 01/24/22 0427 01/25/22 0422 01/26/22 0503 01/26/22 0743  WBC 17.8* 20.2* 24.0* 21.3* 29.3*  --   CREATININE 6.22* 7.47* 5.14* 6.06* 3.96*  --   VANCORANDOM  --   --   --   --   --  14     Estimated Creatinine Clearance: 19.4 mL/min (A) (by C-G formula based on SCr of 3.96 mg/dL (H)).    Allergies  Allergen Reactions   Amoxicillin    Augmentin [Amoxicillin-Pot Clavulanate]    Ceftin [Cefuroxime]    Compazine [Prochlorperazine]    Demerol [Meperidine Hcl]    Macrobid [Nitrofurantoin]    Phenergan [Promethazine]    Sulfa Antibiotics     Antimicrobials this admission: Vancomycin 1/29 >>   Dose adjustments this admission: N/A  Microbiology results: 1/27 MRSA PCR: (-) 1/28 Sputum: Staphylococcus aureus, pan sensitive   Thank you for allowing pharmacy to be a part of this patients care.  Owens Loffler 01/26/2022 12:42 PM

## 2022-01-26 NOTE — Progress Notes (Signed)
Subjective: Dr. Mortimer Fries and palliative care met with patient's family today. They are planning one way extubation on Sunday after family is able to visit.  Objective: Current vital signs: BP (!) 92/53    Pulse (!) 102    Temp (!) 100.5 F (38.1 C) (Axillary)    Resp (!) 29    Ht 5' 7.52" (1.715 m)    Wt 117.5 kg    SpO2 94%    BMI 39.95 kg/m  Vital signs in last 24 hours: Temp:  [99.8 F (37.7 C)-100.5 F (38.1 C)] 100.5 F (38.1 C) (02/02 1600) Pulse Rate:  [89-102] 102 (02/02 1800) Resp:  [17-35] 29 (02/02 1800) BP: (84-129)/(44-59) 92/53 (02/02 1800) SpO2:  [88 %-98 %] 94 % (02/02 1800) FiO2 (%):  [35 %-50 %] 50 % (02/02 1800)  Intake/Output from previous day: 02/01 0701 - 02/02 0700 In: 2222.8 [I.V.:722.8; NG/GT:1500] Out: 1306 [Urine:126] Intake/Output this shift: No intake/output data recorded. Nutritional status:  Diet Order             Diet NPO time specified  Diet effective now                  Physical Exam  HEENT-  [place]/AT. Neck is supple.   Lungs- Intubated and sedated    Extremities- Warm and well perfused    Neurological Examination Note: fentanyl paused x15 min prior to exam, unable to pause for longer 2/2 severe tachypnea off sedation Mental Status: Does not respond to loud clapping or calling of her name in the sedated state. No purposeful movements. No posturing to noxious except for internal rotation of LLE and thigh and ankle to noxious and slight withdrawal of LUE by about 2 cm in response to noxious applied to either arm. No eye opening. Cranial Nerves: II: Pupils almost pinpoint, sluggishly reactive. No blink to threat.  III,IV, VI: Eyes exotropic near the midline without doll's eye reflex on sedation. V,VII: Weak corneals bilaterally   VIII: No response to voice IX,X: Intubated. Cough and gag intact.  XI: Head is midline.  XII: Intubated Motor/Sensory: Flaccid tone x 4 at rest.  No purposeful movements. No posturing to noxious except for  internal rotation of LLE and thigh and ankle to noxious and slight withdrawal of LUE by about 2 cm in response to noxious applied to either arm.  Deep Tendon Reflexes: Hypoactive in the context of sedation. Toes mute on the right and upgoing on the left  Cerebellar/Gait: Unable to assess  Lab Results: Results for orders placed or performed during the hospital encounter of 01/16/2022 (from the past 48 hour(s))  Glucose, capillary     Status: Abnormal   Collection Time: 01/24/22 11:29 PM  Result Value Ref Range   Glucose-Capillary 313 (H) 70 - 99 mg/dL    Comment: Glucose reference range applies only to samples taken after fasting for at least 8 hours.   Comment 1 Notify RN    Comment 2 Document in Chart   CBC with Differential/Platelet     Status: Abnormal   Collection Time: 01/25/22  4:22 AM  Result Value Ref Range   WBC 21.3 (H) 4.0 - 10.5 K/uL   RBC 2.50 (L) 3.87 - 5.11 MIL/uL   Hemoglobin 7.5 (L) 12.0 - 15.0 g/dL   HCT 23.6 (L) 36.0 - 46.0 %   MCV 94.4 80.0 - 100.0 fL   MCH 30.0 26.0 - 34.0 pg   MCHC 31.8 30.0 - 36.0 g/dL   RDW 14.0 11.5 -  15.5 %   Platelets 206 150 - 400 K/uL   nRBC 0.2 0.0 - 0.2 %   Neutrophils Relative % 84 %   Neutro Abs 17.7 (H) 1.7 - 7.7 K/uL   Lymphocytes Relative 3 %   Lymphs Abs 0.7 0.7 - 4.0 K/uL   Monocytes Relative 9 %   Monocytes Absolute 2.0 (H) 0.1 - 1.0 K/uL   Eosinophils Relative 0 %   Eosinophils Absolute 0.0 0.0 - 0.5 K/uL   Basophils Relative 0 %   Basophils Absolute 0.1 0.0 - 0.1 K/uL   WBC Morphology MORPHOLOGY UNREMARKABLE    RBC Morphology MORPHOLOGY UNREMARKABLE    Smear Review Normal platelet morphology    Immature Granulocytes 4 %   Abs Immature Granulocytes 0.83 (H) 0.00 - 0.07 K/uL    Comment: Performed at [facility], [address] [place], [place] [address]  Magnesium     Status: None   Collection Time: 01/25/22  4:22 AM  Result Value Ref Range   Magnesium 2.2 1.7 - 2.4 mg/dL    Comment: Performed at [facility], [address] [place], [place] [address]  Phosphorus     Status: Abnormal   Collection Time: 01/25/22  4:22 AM  Result Value Ref Range   Phosphorus 5.7 (H) 2.5 - 4.6 mg/dL    Comment: Performed at [facility], [address], [place], [place] [address]  Basic metabolic panel     Status: Abnormal   Collection Time: 01/25/22  4:22 AM  Result Value Ref Range   Sodium 127 (L) 135 - 145 mmol/L   Potassium 3.6 3.5 - 5.1 mmol/L   Chloride 90 (L) 98 - 111 mmol/L   CO2 22 22 - 32 mmol/L   Glucose, Bld 357 (H) 70 - 99 mg/dL    Comment: Glucose reference range applies only to samples taken after fasting for at least 8 hours.   BUN 98 (H) 8 - 23 mg/dL   Creatinine, Ser 6.06 (H) 0.44 - 1.00 mg/dL   Calcium 8.9 8.9 - 10.3 mg/dL   GFR, Estimated 7 (L) >60 mL/min    Comment: (NOTE) Calculated using the CKD-EPI Creatinine Equation (2021)    Anion gap 15 5 - 15    Comment: Performed at [facility], [address] [place], [place] [address]  Glucose, capillary     Status: Abnormal   Collection Time: 01/25/22  4:55 AM  Result Value Ref Range   Glucose-Capillary 330 (H) 70 - 99 mg/dL    Comment: Glucose reference range applies only to samples taken after fasting for at least 8 hours.   Comment 1 Notify RN    Comment 2 Document in Chart   Glucose, capillary     Status: Abnormal   Collection Time: 01/25/22  7:52 AM  Result Value Ref Range   Glucose-Capillary 348 (H) 70 - 99 mg/dL    Comment: Glucose reference range applies only to samples taken after fasting for at least 8 hours.  Glucose, capillary     Status: Abnormal   Collection Time: 01/25/22 11:33 AM  Result Value Ref Range   Glucose-Capillary 253 (H) 70 - 99 mg/dL    Comment: Glucose reference range applies only to samples taken after fasting for at least 8 hours.  Glucose, capillary     Status: Abnormal   Collection Time: 01/25/22  4:50 PM  Result Value Ref Range   Glucose-Capillary 246 (H) 70 - 99  mg/dL  Comment: Glucose reference range applies only to samples taken after fasting for at least 8 hours.  Glucose, capillary     Status: Abnormal   Collection Time: 01/25/22  7:24 PM  Result Value Ref Range   Glucose-Capillary 240 (H) 70 - 99 mg/dL    Comment: Glucose reference range applies only to samples taken after fasting for at least 8 hours.   Comment 1 Notify RN    Comment 2 Document in Chart   Glucose, capillary     Status: Abnormal   Collection Time: 01/25/22 11:33 PM  Result Value Ref Range   Glucose-Capillary 257 (H) 70 - 99 mg/dL    Comment: Glucose reference range applies only to samples taken after fasting for at least 8 hours.   Comment 1 Notify RN    Comment 2 Document in Chart   Glucose, capillary     Status: Abnormal   Collection Time: 01/26/22  3:29 AM  Result Value Ref Range   Glucose-Capillary 285 (H) 70 - 99 mg/dL    Comment: Glucose reference range applies only to samples taken after fasting for at least 8 hours.   Comment 1 Notify RN    Comment 2 Document in Chart   CBC     Status: Abnormal   Collection Time: 01/26/22  5:03 AM  Result Value Ref Range   WBC 29.3 (H) 4.0 - 10.5 K/uL   RBC 2.53 (L) 3.87 - 5.11 MIL/uL   Hemoglobin 7.7 (L) 12.0 - 15.0 g/dL   HCT 23.8 (L) 36.0 - 46.0 %   MCV 94.1 80.0 - 100.0 fL   MCH 30.4 26.0 - 34.0 pg   MCHC 32.4 30.0 - 36.0 g/dL   RDW 14.1 11.5 - 15.5 %   Platelets 223 150 - 400 K/uL   nRBC 1.0 (H) 0.0 - 0.2 %    Comment: Performed at [facility], [address] [place], [place] [address]  Renal function panel     Status: Abnormal   Collection Time: 01/26/22  5:03 AM  Result Value Ref Range   Sodium 131 (L) 135 - 145 mmol/L   Potassium 2.9 (L) 3.5 - 5.1 mmol/L   Chloride 96 (L) 98 - 111 mmol/L   CO2 23 22 - 32 mmol/L   Glucose, Bld 313 (H) 70 - 99 mg/dL    Comment: Glucose reference range applies only to samples taken after fasting for at least 8 hours.   BUN 69 (H) 8 - 23 mg/dL   Creatinine, Ser  3.96 (H) 0.44 - 1.00 mg/dL   Calcium 8.4 (L) 8.9 - 10.3 mg/dL   Phosphorus 3.2 2.5 - 4.6 mg/dL   Albumin 1.8 (L) 3.5 - 5.0 g/dL   GFR, Estimated 12 (L) >60 mL/min    Comment: (NOTE) Calculated using the CKD-EPI Creatinine Equation (2021)    Anion gap 12 5 - 15    Comment: Performed at [facility], [address] [place], [place] [address]  Triglycerides     Status: Abnormal   Collection Time: 01/26/22  5:03 AM  Result Value Ref Range   Triglycerides 532 (H) <150 mg/dL    Comment: Performed at [facility], [address] [place], [place] [address]  Glucose, capillary     Status: Abnormal   Collection Time: 01/26/22  7:21 AM  Result Value Ref Range   Glucose-Capillary 367 (H) 70 - 99 mg/dL    Comment: Glucose reference range applies only to samples taken after fasting for  at least 8 hours.  Vancomycin, random     Status: None   Collection Time: 01/26/22  7:43 AM  Result Value Ref Range   Vancomycin Rm 14     Comment:        Random Vancomycin therapeutic range is dependent on dosage and time of specimen collection. A peak range is 20.0-40.0 ug/mL A trough range is 5.0-15.0 ug/mL        Performed at [facility], [address] [place], [place] [address]   Glucose, capillary     Status: Abnormal   Collection Time: 01/26/22 11:13 AM  Result Value Ref Range   Glucose-Capillary 302 (H) 70 - 99 mg/dL    Comment: Glucose reference range applies only to samples taken after fasting for at least 8 hours.  Glucose, capillary     Status: Abnormal   Collection Time: 01/26/22  4:11 PM  Result Value Ref Range   Glucose-Capillary 302 (H) 70 - 99 mg/dL    Comment: Glucose reference range applies only to samples taken after fasting for at least 8 hours.    Recent Results (from the past 240 hour(s))  Resp Panel by RT-PCR (Flu A&B, Covid) Nasopharyngeal Swab     Status: None   Collection Time: 12/29/2021  8:30 PM   Specimen: Nasopharyngeal Swab;  Nasopharyngeal(NP) swabs in vial transport medium  Result Value Ref Range Status   SARS Coronavirus 2 by RT PCR NEGATIVE NEGATIVE Final    Comment: (NOTE) SARS-CoV-2 target nucleic acids are NOT DETECTED.  The SARS-CoV-2 RNA is generally detectable in upper respiratory specimens during the acute phase of infection. The lowest concentration of SARS-CoV-2 viral copies this assay can detect is 138 copies/mL. A negative result does not preclude SARS-Cov-2 infection and should not be used as the sole basis for treatment or other patient management decisions. A negative result may occur with  improper specimen collection/handling, submission of specimen other than nasopharyngeal swab, presence of viral mutation(s) within the areas targeted by this assay, and inadequate number of viral copies(<138 copies/mL). A negative result must be combined with clinical observations, patient history, and epidemiological information. The expected result is Negative.  Fact Sheet for Patients:  EntrepreneurPulse.com.au  Fact Sheet for Healthcare Providers:  IncredibleEmployment.be  This test is no t yet approved or cleared by the [place] FDA and  has been authorized for detection and/or diagnosis of SARS-CoV-2 by FDA under an Emergency Use Authorization (EUA). This EUA will remain  in effect (meaning this test can be used) for the duration of the COVID-19 declaration under Section 564(b)(1) of the Act, 21 U.S.C.section 360bbb-3(b)(1), unless the authorization is terminated  or revoked sooner.       Influenza A by PCR NEGATIVE NEGATIVE Final   Influenza B by PCR NEGATIVE NEGATIVE Final    Comment: (NOTE) The Xpert Xpress SARS-CoV-2/FLU/RSV plus assay is intended as an aid in the diagnosis of influenza from Nasopharyngeal swab specimens and should not be used as a sole basis for treatment. Nasal washings and aspirates are unacceptable for Xpert Xpress  SARS-CoV-2/FLU/RSV testing.  Fact Sheet for Patients: EntrepreneurPulse.com.au  Fact Sheet for Healthcare Providers: IncredibleEmployment.be  This test is not yet approved or cleared by the [place] FDA and has been authorized for detection and/or diagnosis of SARS-CoV-2 by FDA under an Emergency Use Authorization (EUA). This EUA will remain in effect (meaning this test can be used) for the duration of the COVID-19 declaration under Section 564(b)(1) of the Act, 21 U.S.C.  section 360bbb-3(b)(1), unless the authorization is terminated or revoked.  Performed at [facility], [address] [place], [place] [address]   MRSA Next Gen by PCR, Nasal     Status: None   Collection Time: 01/20/22  8:50 AM   Specimen: Nasal Mucosa; Nasal Swab  Result Value Ref Range Status   MRSA by PCR Next Gen NOT DETECTED NOT DETECTED Final    Comment: (NOTE) The GeneXpert MRSA Assay (FDA approved for NASAL specimens only), is one component of a comprehensive MRSA colonization surveillance program. It is not intended to diagnose MRSA infection nor to guide or monitor treatment for MRSA infections. Test performance is not FDA approved in patients less than 66 years old. Performed at [facility], [address] [place], [place] [address]   Culture, Respiratory w Gram Stain     Status: None   Collection Time: 01/21/22  2:46 AM   Specimen: Tracheal Aspirate; Respiratory  Result Value Ref Range Status   Specimen Description   Final    TRACHEAL ASPIRATE Performed at [facility], [address] [place], [place] [address]    Special Requests   Final    NONE Performed at [facility], [address] [place], [place] [address]    Gram Stain   Final    ABUNDANT WBC PRESENT, PREDOMINANTLY PMN ABUNDANT GRAM POSITIVE COCCI IN CLUSTERS Performed at [facility], [place] [address], [place], [place] [address]     Culture ABUNDANT STAPHYLOCOCCUS AUREUS  Final   Report Status 01/23/2022 FINAL  Final   Organism ID, Bacteria STAPHYLOCOCCUS AUREUS  Final      Susceptibility   Staphylococcus aureus - MIC*    CIPROFLOXACIN <=0.5 SENSITIVE Sensitive     ERYTHROMYCIN <=0.25 SENSITIVE Sensitive     GENTAMICIN <=0.5 SENSITIVE Sensitive     OXACILLIN <=0.25 SENSITIVE Sensitive     TETRACYCLINE <=1 SENSITIVE Sensitive     VANCOMYCIN <=0.5 SENSITIVE Sensitive     TRIMETH/SULFA <=10 SENSITIVE Sensitive     CLINDAMYCIN <=0.25 SENSITIVE Sensitive     RIFAMPIN <=0.5 SENSITIVE Sensitive     Inducible Clindamycin NEGATIVE Sensitive     * ABUNDANT STAPHYLOCOCCUS AUREUS    Lipid Panel Recent Labs    01/26/22 0503  TRIG 532*     Studies/Results: MR BRAIN WO CONTRAST  Result Date: 01/24/2022 CLINICAL DATA:  Stroke suspected altered mental status and increased agitation EXAM: MRI HEAD WITHOUT CONTRAST TECHNIQUE: Multiplanar, multiecho pulse sequences of the brain and surrounding structures were obtained without intravenous contrast. COMPARISON:  01/21/2022 MRI FINDINGS: Brain: Numerous new foci of restricted diffusion in the bilateral cerebral hemispheres, which are new from the prior exam and demonstrate ADC correlates. Some of these are in the ACA-PCA watershed territory (series 5, image 33-35), while others are cortical, as seen in the right frontal lobe (series 5, image 21, right temporal lobe (series 5, image 25 right occipital lobe (series 5, image 21), and left occipital lobe (series 5, image 23). Redemonstrated subacute infarcts seen on the prior exam, including in the left frontal lobe and right posterior temporal lobe. No acute hemorrhage, mass, mass effect, or midline shift. No hydrocephalus or extra-axial collection. Vascular: Normal flow voids. Skull and upper cervical spine: Normal marrow signal. Sinuses/Orbits: Mucosal thickening in the sphenoid sinuses, with air-fluid level in the left sphenoid sinus. The  sinuses are otherwise largely clear. The orbits are unremarkable. Other: Fluid throughout the bilateral mastoid air cells. IMPRESSION: 1. 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 Given multiple vascular territories, a central embolic etiology should be considered. 2. Air-fluid level in the left sphenoid sinus, as can be seen with acute sinusitis. Correlate with symptoms. Impression #1 will be called to the ordering clinician or representative by the Radiologist Assistant, and communication documented in the PACS or [employer]. Electronically Signed   By: Wiliam Ke M.D.   On: 01/24/2022 23:33   DG Chest Port 1 View  Result Date: 01/26/2022 CLINICAL DATA:  Acute respiratory failure with hypoxia. EXAM: PORTABLE CHEST 1 VIEW COMPARISON:  01/23/2022. FINDINGS: Patient rotated to the right. Endotracheal tube tip at the level of the clavicular heads. Gastric tube courses below the diaphragm with the tip outside the field of view. Hazy left basilar opacities. Cardiomediastinal silhouette is similar. Similar position of a left IJ central venous catheter with tip projecting in the region of the proximal SVC. IMPRESSION: Hazy left basilar opacities, which may represent layering pleural effusion with overlying atelectasis and/or pneumonia. Electronically Signed   By: Feliberto Harts M.D.   On: 01/26/2022 08:14   EEG adult  Result Date: 01/24/2022 Jefferson Fuel, MD     01/24/2022  7:50 PM Routine EEG Report Almee Makynli Stills is a 66 y.o. female with a history of severe encephalopathy who is undergoing an EEG to evaluate for seizures. Report: This EEG was acquired with electrodes placed according to the International 10-20 electrode system (including Fp1, Fp2, F3, F4, C3, C4, P3, P4, O1, O2, T3, T4, T5, T6, A1, A2, Fz, Cz, Pz). The  following electrodes were missing or displaced: none. There was no clear waking rhythm and no sleep architecture. Best background was generalized 5-7 Hz theta slowing with intermittent 2-3 Hz delta slowing. There was no focal slowing. There were no interictal epileptiform discharges. There were no electrographic seizures identified. Photic stimulation and hyperventilation were not performed. Impression and clinical correlation: This EEG was obtained while sedated on fentanyl and comatose and was abnormal due to moderate-to-severe diffuse slowing. Epileptiform abnormalities were not seen during this recording. Bing Neighbors, MD [facility] [phone] If 7pm- 7am, please page neurology on call as listed in AMION.    Medications: Scheduled:  albuterol  2.5 mg Nebulization Q6H   aspirin  81 mg Per Tube Daily   chlorhexidine gluconate (MEDLINE KIT)  15 mL Mouth Rinse BID   Chlorhexidine Gluconate Cloth  6 each Topical Q0600   docusate  100 mg Per Tube BID   feeding supplement (PROSource TF)  90 mL Per Tube TID   free water  30 mL Per Tube Q4H   heparin  5,000 Units Subcutaneous Q8H   insulin aspart  0-20 Units Subcutaneous Q4H   insulin glargine-yfgn  8 Units Subcutaneous BID   levothyroxine  200 mcg Per Tube Q0600   mouth rinse  15 mL Mouth Rinse 10 times per day   midodrine  10 mg Per Tube TID WC   multivitamin  1 tablet Per Tube QHS   pantoprazole (PROTONIX) IV  40 mg Intravenous Daily   polyethylene glycol  17 g Per Tube Daily   sodium chloride flush  10-40 mL Intracatheter Q12H   Continuous:  sodium chloride     feeding supplement (VITAL AF 1.2 CAL) 1,000 mL (01/25/22 2325)   fentaNYL infusion INTRAVENOUS 200 mcg/hr (01/26/22 1240)  norepinephrine (LEVOPHED) Adult infusion 5 mcg/min (01/26/22 1355)   vancomycin Stopped (01/23/22 1905)    Assessment: 66 year old female who presented to the hospital with severe metabolic derangements in the setting of missed hemodialysis  sessions. She was subsequently intubated for respiratory distress. She has had persistent AMS since admission. 1. Exam continues to show findings consistent with diffuse cerebral hypofunction and do not improve after holding sedation 2. Agree with Psychiatry that metabolic abnormalities due to failure to keep up with dialysis are most likely contributing to the patient's AMS. Also agree with possible contribution from severe hypothyroidism.   3. Head CT: No acute intracranial abnormality. Mild chronic small vessel ischemia 4. EEG 1/28: Finding of continuous generalized slowing is suggestive of moderate diffuse encephalopathy, nonspecific to etiology. No seizures or epileptiform discharges were seen throughout the recording. 5. MRI brain 1/28: Punctate acute to subacute infarcts in the left inferior frontal gyrus, left occipital lobe, and right temporal lobe periventricular white matter. No associated hemorrhage or mass effect. Mild chronic white matter microangiopathy. 6. Although the tiny foci of acute infarction are not felt to be contributing to her AMS, will need stroke work up.  7. TTE showed no intracardiac clot. Bilateral infarcts c/w central embolic source, but further w/u not c/w GOC 8. MRA head no hemodynamically-significant stenoses  Family has elected to transition to comfort care on Sun. I will f/u tomorrow to show them the MRI scans per their request. No further recommendations given present [place].   Su Monks, MD [facility] [phone]  If 7pm- 7am, please page neurology on call as listed in [facility].

## 2022-01-26 NOTE — Progress Notes (Signed)
NAME:  Regina Jackson, MRN:  [id number], DOB:  May 31, 1956, LOS: 9 ADMISSION DATE:  01/01/2022, CONSULTATION DATE:  01/20/22 REFERRING MD:  Dr. Mal Misty, CHIEF COMPLAINT:  AMS, Acute Respiratory Distress   Brief Pt Description / Synopsis:  66 y.o. Female with PMH significant for ESRD on HD, admitted with Acute Metabolic Encephalopathy due to multiple severe metabolic derangements in setting of missed HD sessions and Hypothyroidism (query ? Myxedema coma).  Developed acute respiratory distress requiring intubation and mechanical ventilation.  History of Present Illness:  Regina Jackson is a 66 year old female with a past medical history significant for ESRD on home hemodialysis with noncompliance at times, hypothyroidism, anemia of chronic disease, endometrial cancer, hypertension, diabetes mellitus who presented to [facility] ED on 01/13/2022 due to altered mental status and increasing agitation.  Patient had no chest pain, shortness of breath, abdominal pain, nausea, vomiting, diarrhea.  The patient's husband reported she had not been compliant with her home hemodialysis in nearly 8 days, also unsure if she has been taking her thyroid medication.  ED Course: Initial vital signs: Temperature 97.7 orally, respiratory 15, pulse 96, blood pressure 199/163 Significant labs: Sodium 134, bicarbonate 16, glucose 211, BUN 49, creatinine 5.9, alkaline phosphatase 132, BNP 329, high-sensitivity troponin 42, WBC 11.3, hemoglobin 10.9, TSH 58.8 Venous blood gas: pH 7.3/PCO2 33/PO2 54/bicarbonate 16.2 COVID-19 PCR negative Imaging: Chest x-ray:Mild cardiomegaly with vascular congestion. Possible small left effusion. No consolidation or pneumothorax CT head:IMPRESSION: 1. No acute intracranial abnormality. 2. Mild chronic small vessel ischemia.   She was placed under IVC, and the hospitalist were asked to admit.  Nephrology was consulted for dialysis during her stay.  However given her agitation and confusion, dialysis  was unable to's be performed safely therefore he was deferred for reevaluation in the next day.   Pertinent  Medical History  ESRD on hemodialysis Noncompliance with dialysis Anemia of chronic disease Endometrial cancer Hypothyroidism Hypertension diabetes mellitus  Micro Data:  1/24: SARS-CoV-2 and influenza PCR>> negative 1/25: HIV screen>> nonreactive 1/27: MRSA PCR>> negative 1/27: Tracheal aspirate>>abundant GPC, Staph aureus, sensitivities pending  Antimicrobials:  1/29 vancomycin>>  Significant Hospital Events: Including procedures, antibiotic start and stop dates in addition to other pertinent events   1/24: Admitted by the hospitalist for altered mental status and hypertensive urgency.  Nephrology consulted 1/25: Unable to safely perform dialysis due to severe agitation and confusion.  Plan to reassess HD tomorrow 1/26: Received IV Haldol and Valium without improvement in mental status.  Psychiatry consulted 1/27: Required emergent intubation due to AMS and Acute Respiratory Distress.  MRI and EEG pending, Neuro consulted.  Plan for HD now that pt is sedated 1/28 remains on vent,poorly responsive on SAT, MRI today 1/29 copious ETT secretions, started Levaquin (allergies to ampicillin/Unasyn), cardiogram done, pending 1/30 MRI +for acute CVA, severe resp failure 1/31 remains on vent and pressors 2/1 MRI shows multiple new strokes, resp failure 2/2 severe brain damage on exam, multiple strokes Interim History / Subjective:   MRI shows multiple new strokes severe brain damage at this time +staph pneumonia Remains critically ill Prognosis is grave On pressors   Objective   Blood pressure (!) 107/55, pulse 99, temperature 99.1 F (37.3 C), temperature source Axillary, resp. rate (!) 30, height 5' 7.52" (1.715 m), weight 117.5 kg, SpO2 91 %.    Vent Mode: PRVC FiO2 (%):  [35 %-50 %] 50 % Set Rate:  [20 bmp] 20 bmp Vt Set:  [500 mL] 500 mL PEEP:  [8 cmH20] 8  cmH20    Intake/Output Summary (Last 24 hours) at 01/26/2022 0925 Last data filed at 01/26/2022 0700 Gross per 24 hour  Intake 2097.82 ml  Output 1270 ml  Net 827.82 ml    Filed Weights   01/23/22 1935 01/25/22 0945 01/25/22 1315  Weight: 119.4 kg 118.6 kg 117.5 kg      REVIEW OF SYSTEMS  PATIENT IS UNABLE TO PROVIDE COMPLETE REVIEW OF SYSTEMS DUE TO SEVERE CRITICAL ILLNESS AND TOXIC METABOLIC ENCEPHALOPATHY    PHYSICAL EXAMINATION:  GENERAL:critically ill appearing, +resp distress EYES: Pupils equal, round, reactive to light.  No scleral icterus.  MOUTH: Moist mucosal membrane. INTUBATED NECK: Supple.  PULMONARY: +rhonchi, +wheezing CARDIOVASCULAR: S1 and S2.  No murmurs  GASTROINTESTINAL: Soft, nontender, -distended. Positive bowel sounds.  MUSCULOSKELETAL: No swelling, clubbing, or edema.  NEUROLOGIC: obtunded SKIN:intact,warm,dry   Assessment & Plan:   Acute Hypoxic Respiratory Failure in the setting of severe metabolic derangements Inability to protect airway with acute CVA, STAPH PNEUMONIA with new multiple acute strokes with signs of severe brain damage  Severe ACUTE Hypoxic and Hypercapnic Respiratory Failure -continue Mechanical Ventilator support -Wean Fio2 and PEEP as tolerated -VAP/VENT bundle implementation - Wean PEEP & FiO2 as tolerated, maintain SpO2 > 88% - Head of bed elevated 30 degrees, VAP protocol in place - Plateau pressures less than 30 cm H20  - Intermittent chest x-ray & ABG PRN - Ensure adequate pulmonary hygiene  -will NOT  perform SAT/SBT when respiratory parameters are met   SEPTIC shock SOURCE-pneumonia -use vasopressors to keep MAP>65 as needed -follow ABG and LA as needed +Vancomycin    NEUROLOGY ACUTE TOXIC METABOLIC ENCEPHALOPATHY Signs of significant brain damage Acute Metabolic Encephalopathy in setting of multiple severe metabolic derangements (AKI and uremia, Hypothyroidism with ? Myxedema coma Multiple  acute  infarcts Sedation needs in the setting of mechanical ventilation Neurology consult- appreciate input    RENAL ESRD on Hemodialysis Mild Hyperkalemia Metabolic Acidosis -HD as per Nephrology    ENDO - ICU hypoglycemic\Hyperglycemia protocol -check FSBS per protocol Diabetes Mellitus Hypothyroidism, query ? Myxedema Coma PMHx: Radioactive Iodine Thyroid ablation -CBG's q4h; Target range of 140 to 180 -SSI -Follow ICU Hypo/Hyperglycemia protocol -Continue IV Synthroid 100 mg daily (received 200 mg x1 earlier in stay) -Follow TSH and thyroid panel   GI GI PROPHYLAXIS as indicated  NUTRITIONAL STATUS DIET-->TF's as tolerated Constipation protocol as indicated   ELECTROLYTES -follow labs as needed -replace as needed -pharmacy consultation and following       Best Practice (right click and "Reselect all SmartList Selections" daily)   Diet/type: NPO DVT prophylaxis: prophylactic heparin  GI prophylaxis: PPI Lines: Central line and yes and it is still needed Foley:  Yes, and it is still needed Code Status: DNR status Last date of multidisciplinary goals of care discussion [1/27]    Labs   CBC: Recent Labs  Lab 01/20/22 0417 01/20/22 1058 01/22/22 0340 01/23/22 0407 01/24/22 0427 01/25/22 0422 01/26/22 0503  WBC 17.7*   < > 17.8* 20.2* 24.0* 21.3* 29.3*  NEUTROABS 15.3*  --   --   --   --  17.7*  --   HGB 9.9*   < > 8.3* 8.6* 8.2* 7.5* 7.7*  HCT 31.5*   < > 27.1* 27.6* 25.4* 23.6* 23.8*  MCV 92.6   < > 96.4 97.5 94.8 94.4 94.1  PLT 285   < > 170 183 196 206 223   < > = values in this interval not displayed.  Basic Metabolic Panel: Recent Labs  Lab 01/21/22 0537 01/22/22 0340 01/23/22 0407 01/24/22 0427 01/25/22 0422 01/26/22 0503  NA  --  132* 135 130* 127* 131*  K  --  3.6 3.7 3.5 3.6 2.9*  CL  --  101 99 93* 90* 96*  CO2  --  20* $Re'22 26 22 23  'mQa$ GLUCOSE  --  230* 235* 289* 357* 313*  BUN  --  58* 86* 58* 98* 69*  CREATININE  --   6.22* 7.47* 5.14* 6.06* 3.96*  CALCIUM  --  8.8* 8.8* 8.7* 8.9 8.4*  MG 1.8 1.8 1.7 1.7 2.2  --   PHOS  --  6.3* 7.1* 6.0* 5.7* 3.2    GFR: Estimated Creatinine Clearance: 19.4 mL/min (A) (by C-G formula based on SCr of 3.96 mg/dL (H)). Recent Labs  Lab 01/20/22 2252 01/21/22 0537 01/22/22 0340 01/23/22 0407 01/24/22 0427 01/25/22 0422 01/26/22 0503  PROCALCITON 0.56 0.80 1.86  --   --   --   --   WBC  --  15.1* 17.8* 20.2* 24.0* 21.3* 29.3*     Liver Function Tests: Recent Labs  Lab 01/20/22 0417 01/20/22 1058 01/21/22 0530 01/22/22 0340 01/23/22 0407 01/26/22 0503  AST 79* 67*  --   --   --   --   ALT 39 37  --   --   --   --   ALKPHOS 126 114  --   --   --   --   BILITOT 0.8 0.7  --   --   --   --   PROT 7.4 6.7  --   --   --   --   ALBUMIN 3.6 3.0* 2.9* 2.6* 2.3* 1.8*    No results for input(s): LIPASE, AMYLASE in the last 168 hours. No results for input(s): AMMONIA in the last 168 hours.   ABG    Component Value Date/Time   PHART 7.18 (LL) 01/23/2022 0318   PCO2ART 43 01/23/2022 0318   PO2ART 84 01/23/2022 0318   HCO3 16.0 (L) 01/23/2022 0318   ACIDBASEDEF 11.7 (H) 01/23/2022 0318   O2SAT 93.1 01/23/2022 0318    HbA1C: Hgb A1c MFr Bld  Date/Time Value Ref Range Status  01/18/2022 06:25 AM 9.2 (H) 4.8 - 5.6 % Final    Comment:    (NOTE)         Prediabetes: 5.7 - 6.4         Diabetes: >6.4         Glycemic control for adults with diabetes: <7.0     CBG: Recent Labs  Lab 01/25/22 1650 01/25/22 1924 01/25/22 2333 01/26/22 0329 01/26/22 0721  GLUCAP 246* 240* 257* 285* 367*    MRI brain 1/28: IMPRESSION: 1. 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 No associated hemorrhage or mass effect. 2. Mild chronic white matter microangiopathy.  2D echo performed today, interpretation pending.   Review of Systems:   Unable to assess due  to AMS/mechanically ventilated status  Allergies Allergies  Allergen Reactions   Amoxicillin    Augmentin [Amoxicillin-Pot Clavulanate]    Ceftin [Cefuroxime]    Compazine [Prochlorperazine]    Demerol [Meperidine Hcl]    Macrobid [Nitrofurantoin]    Phenergan [Promethazine]    Sulfa Antibiotics      Medications   Scheduled Meds:  albuterol  2.5 mg Nebulization Q6H   aspirin  81 mg Per Tube  Daily   chlorhexidine gluconate (MEDLINE KIT)  15 mL Mouth Rinse BID   Chlorhexidine Gluconate Cloth  6 each Topical Q0600   docusate  100 mg Per Tube BID   feeding supplement (PROSource TF)  90 mL Per Tube TID   free water  30 mL Per Tube Q4H   heparin  5,000 Units Subcutaneous Q8H   insulin aspart  0-20 Units Subcutaneous Q4H   insulin glargine-yfgn  8 Units Subcutaneous BID   levothyroxine  200 mcg Per Tube Q0600   mouth rinse  15 mL Mouth Rinse 10 times per day   midodrine  10 mg Per Tube TID WC   multivitamin  1 tablet Per Tube QHS   pantoprazole (PROTONIX) IV  40 mg Intravenous Daily   polyethylene glycol  17 g Per Tube Daily   sodium chloride flush  10-40 mL Intracatheter Q12H   Continuous Infusions:  sodium chloride     feeding supplement (VITAL AF 1.2 CAL) 1,000 mL (01/25/22 2325)   norepinephrine (LEVOPHED) Adult infusion 6 mcg/min (01/26/22 0240)   propofol (DIPRIVAN) infusion 70 mcg/kg/min (01/26/22 0645)   vancomycin Stopped (01/23/22 1905)   PRN Meds:.sodium chloride, acetaminophen **OR** acetaminophen, alteplase, fentaNYL (SUBLIMAZE) injection, heparin, lidocaine (PF), lidocaine-prilocaine, LORazepam, midazolam, ondansetron **OR** ondansetron (ZOFRAN) IV, pentafluoroprop-tetrafluoroeth, sodium chloride flush, vancomycin, vecuronium      DVT/GI PRX  assessed I Assessed the need for Labs I Assessed the need for Foley I Assessed the need for Central Venous Line Family Discussion when available I Assessed the need for Mobilization I made an Assessment of  medications to be adjusted accordingly Safety Risk assessment completed  CASE DISCUSSED IN MULTIDISCIPLINARY ROUNDS WITH ICU TEAM     Critical Care Time devoted to patient care services described in this note is 55 minutes.  Critical care was necessary to treat /prevent imminent and life-threatening deterioration. Overall, patient is critically ill, prognosis is guarded.  Patient with Multiorgan failure and at high risk for cardiac arrest and death.    Corrin Parker, M.D.  [facility]  Medical Director [place] Director [facility] Cardio-Pulmonary Department

## 2022-01-26 NOTE — Progress Notes (Signed)
[facility], [place] 01/26/22  Subjective:   LOS: 9  Patient continues to be critically ill.  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 Underwent hemodialysis treatment yesterday. Family considering comfort care.  Objective:  Vital signs in last 24 hours:  Temp:  [99 F (37.2 C)-99.4 F (37.4 C)] 99.1 F (37.3 C) (02/01 1315) Pulse Rate:  [89-110] 98 (02/02 0700) Resp:  [17-35] 22 (02/02 0700) BP: (81-154)/(33-65) 100/59 (02/02 0700) SpO2:  [88 %-99 %] 90 % (02/02 0700) FiO2 (%):  [40 %] 40 % (02/02 0306) Weight:  [117.5 kg-118.6 kg] 117.5 kg (02/01 1315)  Weight change:  Filed Weights   01/23/22 1935 01/25/22 0945 01/25/22 1315  Weight: 119.4 kg 118.6 kg 117.5 kg    Intake/Output:    Intake/Output Summary (Last 24 hours) at 01/26/2022 0740 Last data filed at 01/26/2022 0700 Gross per 24 hour  Intake 2222.83 ml  Output 1306 ml  Net 916.83 ml      Physical Exam: General: Critically ill-appearing  HEENT Endotracheal tube in place  Pulm/lungs Scattered rhonchi, vent assisted  CVS/Heart S1S2 no rub  Abdomen:  Soft, nontender  Extremities: Trace lower extremity edema  Neurologic: Intubated, not following commands  Skin: Warm, dry  Access: Left arm AV fistula       Basic Metabolic Panel:  Recent Labs  Lab 01/21/22 0537 01/22/22 0340 01/23/22 0407 01/24/22 0427 01/25/22 0422 01/26/22 0503  NA  --  132* 135 130* 127* 131*  K  --  3.6 3.7 3.5 3.6 2.9*  CL  --  101 99 93* 90* 96*  CO2  --  20* _0 GLUCOSE  --  230* 235* 289* 357* 313*  BUN  --  58* 86* 58* 98* 69*  CREATININE  --  6.22* 7.47* 5.14* 6.06* 3.96*  CALCIUM  --  8.8* 8.8* 8.7* 8.9 8.4*  MG 1.8 1.8 1.7 1.7 2.2  --   PHOS  --  6.3* 7.1* 6.0* 5.7* 3.2      CBC: Recent Labs  Lab 01/19/22 0918 01/20/22 0417 01/20/22 1058 01/22/22 0340 01/23/22 0407 01/24/22 0427 01/25/22 0422 01/26/22 0503  WBC 14.0* 17.7*   < > 17.8* 20.2* 24.0* 21.3* 29.3*   NEUTROABS 12.1* 15.3*  --   --   --   --  17.7*  --   HGB 10.2* 9.9*   < > 8.3* 8.6* 8.2* 7.5* 7.7*  HCT 31.6* 31.5*   < > 27.1* 27.6* 25.4* 23.6* 23.8*  MCV 91.1 92.6   < > 96.4 97.5 94.8 94.4 94.1  PLT 260 285   < > 170 183 196 206 223   < > = values in this interval not displayed.       Lab Results  Component Value Date   HEPBSAG NON REACTIVE 01/20/2022   HEPBSAB Reactive (A) 01/20/2022      Microbiology:  Recent Results (from the past 240 hour(s))  Resp Panel by RT-PCR (Flu A&B, Covid) Nasopharyngeal Swab     Status: None   Collection Time: 12/26/2021  8:30 PM   Specimen: Nasopharyngeal Swab; Nasopharyngeal(NP) swabs in vial transport medium  Result Value Ref Range Status   SARS Coronavirus 2 by RT PCR NEGATIVE NEGATIVE Final    Comment: (NOTE) SARS-CoV-2 target nucleic acids are NOT DETECTED.  The SARS-CoV-2 RNA is generally detectable in upper respiratory specimens during the acute phase of infection. The lowest concentration of SARS-CoV-2 viral copies this assay can detect is 138  copies/mL. A negative result does not preclude SARS-Cov-2 infection and should not be used as the sole basis for treatment or other patient management decisions. A negative result may occur with  improper specimen collection/handling, submission of specimen other than nasopharyngeal swab, presence of viral mutation(s) within the areas targeted by this assay, and inadequate number of viral copies(<138 copies/mL). A negative result must be combined with clinical observations, patient history, and epidemiological information. The expected result is Negative.  Fact Sheet for Patients:  EntrepreneurPulse.com.au  Fact Sheet for Healthcare Providers:  IncredibleEmployment.be  This test is no t yet approved or cleared by the [place] FDA and  has been authorized for detection and/or diagnosis of SARS-CoV-2 by FDA under an Emergency Use Authorization  (EUA). This EUA will remain  in effect (meaning this test can be used) for the duration of the COVID-19 declaration under Section 564(b)(1) of the Act, 21 U.S.C.section 360bbb-3(b)(1), unless the authorization is terminated  or revoked sooner.       Influenza A by PCR NEGATIVE NEGATIVE Final   Influenza B by PCR NEGATIVE NEGATIVE Final    Comment: (NOTE) The Xpert Xpress SARS-CoV-2/FLU/RSV plus assay is intended as an aid in the diagnosis of influenza from Nasopharyngeal swab specimens and should not be used as a sole basis for treatment. Nasal washings and aspirates are unacceptable for Xpert Xpress SARS-CoV-2/FLU/RSV testing.  Fact Sheet for Patients: EntrepreneurPulse.com.au  Fact Sheet for Healthcare Providers: IncredibleEmployment.be  This test is not yet approved or cleared by the [place] FDA and has been authorized for detection and/or diagnosis of SARS-CoV-2 by FDA under an Emergency Use Authorization (EUA). This EUA will remain in effect (meaning this test can be used) for the duration of the COVID-19 declaration under Section 564(b)(1) of the Act, 21 U.S.C. section 360bbb-3(b)(1), unless the authorization is terminated or revoked.  Performed at [facility], [address] [place], [place] [address]   MRSA Next Gen by PCR, Nasal     Status: None   Collection Time: 01/20/22  8:50 AM   Specimen: Nasal Mucosa; Nasal Swab  Result Value Ref Range Status   MRSA by PCR Next Gen NOT DETECTED NOT DETECTED Final    Comment: (NOTE) The GeneXpert MRSA Assay (FDA approved for NASAL specimens only), is one component of a comprehensive MRSA colonization surveillance program. It is not intended to diagnose MRSA infection nor to guide or monitor treatment for MRSA infections. Test performance is not FDA approved in patients less than 73 years old. Performed at [facility], [address] [place], [place]  [address]   Culture, Respiratory w Gram Stain     Status: None   Collection Time: 01/21/22  2:46 AM   Specimen: Tracheal Aspirate; Respiratory  Result Value Ref Range Status   Specimen Description   Final    TRACHEAL ASPIRATE Performed at [facility], [address] [place], [place] [address]    Special Requests   Final    NONE Performed at [facility], [address] [place], [place] [address]    Gram Stain   Final    ABUNDANT WBC PRESENT, PREDOMINANTLY PMN ABUNDANT GRAM POSITIVE COCCI IN CLUSTERS Performed at [facility], [place] [address] [place], [place] [address]    Culture ABUNDANT STAPHYLOCOCCUS AUREUS  Final   Report Status 01/23/2022 FINAL  Final   Organism ID, Bacteria STAPHYLOCOCCUS AUREUS  Final      Susceptibility   Staphylococcus aureus - MIC*  CIPROFLOXACIN <=0.5 SENSITIVE Sensitive     ERYTHROMYCIN <=0.25 SENSITIVE Sensitive     GENTAMICIN <=0.5 SENSITIVE Sensitive     OXACILLIN <=0.25 SENSITIVE Sensitive     TETRACYCLINE <=1 SENSITIVE Sensitive     VANCOMYCIN <=0.5 SENSITIVE Sensitive     TRIMETH/SULFA <=10 SENSITIVE Sensitive     CLINDAMYCIN <=0.25 SENSITIVE Sensitive     RIFAMPIN <=0.5 SENSITIVE Sensitive     Inducible Clindamycin NEGATIVE Sensitive     * ABUNDANT STAPHYLOCOCCUS AUREUS    Coagulation Studies: No results for input(s): LABPROT, INR in the last 72 hours.  Urinalysis: No results for input(s): COLORURINE, LABSPEC, PHURINE, GLUCOSEU, HGBUR, BILIRUBINUR, KETONESUR, PROTEINUR, UROBILINOGEN, NITRITE, LEUKOCYTESUR in the last 72 hours.  Invalid input(s): APPERANCEUR     Imaging: MR BRAIN WO CONTRAST  Result Date: 01/24/2022 CLINICAL DATA:  Stroke suspected altered mental status and increased agitation EXAM: MRI HEAD WITHOUT CONTRAST TECHNIQUE: Multiplanar, multiecho pulse sequences of the brain and surrounding structures were obtained without intravenous contrast. COMPARISON:  01/21/2022 MRI FINDINGS: Brain:  Numerous new foci of restricted diffusion in the bilateral cerebral hemispheres, which are new from the prior exam and demonstrate ADC correlates. Some of these are in the ACA-PCA watershed territory (series 5, image 33-35), while others are cortical, as seen in the right frontal lobe (series 5, image 21, right temporal lobe (series 5, image 25 right occipital lobe (series 5, image 21), and left occipital lobe (series 5, image 23). Redemonstrated subacute infarcts seen on the prior exam, including in the left frontal lobe and right posterior temporal lobe. No acute hemorrhage, mass, mass effect, or midline shift. No hydrocephalus or extra-axial collection. Vascular: Normal flow voids. Skull and upper cervical spine: Normal marrow signal. Sinuses/Orbits: Mucosal thickening in the sphenoid sinuses, with air-fluid level in the left sphenoid sinus. The sinuses are otherwise largely clear. The orbits are unremarkable. Other: Fluid throughout the bilateral mastoid air cells. IMPRESSION: 1. 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 Given multiple vascular territories, a central embolic etiology should be considered. 2. Air-fluid level in the left sphenoid sinus, as can be seen with acute sinusitis. Correlate with symptoms. Impression #1 will be called to the ordering clinician or representative by the Radiologist Assistant, and communication documented in the PACS or [employer]. Electronically Signed   By: Merilyn Baba M.D.   On: 01/24/2022 23:33   EEG adult  Result Date: 01/24/2022 Derek Jack, MD     01/24/2022  7:50 PM Routine EEG Report Regina Jackson is a 66 y.o. female with a history of severe encephalopathy who is undergoing an EEG to evaluate for seizures. Report: This EEG was acquired with electrodes placed according to  the International 10-20 electrode system (including Fp1, Fp2, F3, F4, C3, C4, P3, P4, O1, O2, T3, T4, T5, T6, A1, A2, Fz, Cz, Pz). The following electrodes were missing or displaced: none. There was no clear waking rhythm and no sleep architecture. Best background was generalized 5-7 Hz theta slowing with intermittent 2-3 Hz delta slowing. There was no focal slowing. There were no interictal epileptiform discharges. There were no electrographic seizures identified. Photic stimulation and hyperventilation were not performed. Impression and clinical correlation: This EEG was obtained while sedated on fentanyl and comatose and was abnormal due to moderate-to-severe diffuse slowing. Epileptiform abnormalities were not seen during this recording.  Su Monks, MD [facility] [phone] If 7pm- 7am, please page neurology on call as listed in [place].     Medications:    sodium chloride     feeding supplement (VITAL AF 1.2 CAL) 1,000 mL (01/25/22 2325)   norepinephrine (LEVOPHED) Adult infusion 6 mcg/min (01/26/22 0240)   propofol (DIPRIVAN) infusion 70 mcg/kg/min (01/26/22 0645)   vancomycin Stopped (01/23/22 1905)    albuterol  2.5 mg Nebulization Q6H   aspirin  81 mg Per Tube Daily   chlorhexidine gluconate (MEDLINE KIT)  15 mL Mouth Rinse BID   Chlorhexidine Gluconate Cloth  6 each Topical Q0600   docusate  100 mg Per Tube BID   feeding supplement (PROSource TF)  90 mL Per Tube TID   free water  30 mL Per Tube Q4H   heparin  5,000 Units Subcutaneous Q8H   insulin aspart  0-20 Units Subcutaneous Q4H   insulin glargine-yfgn  8 Units Subcutaneous BID   levothyroxine  200 mcg Per Tube Q0600   mouth rinse  15 mL Mouth Rinse 10 times per day   midodrine  10 mg Per Tube TID WC   multivitamin  1 tablet Per Tube QHS   pantoprazole (PROTONIX) IV  40 mg Intravenous Daily   polyethylene glycol  17 g Per Tube Daily   sodium chloride flush  10-40 mL Intracatheter Q12H   sodium chloride,  acetaminophen **OR** acetaminophen, alteplase, fentaNYL (SUBLIMAZE) injection, heparin, lidocaine (PF), lidocaine-prilocaine, LORazepam, midazolam, ondansetron **OR** ondansetron (ZOFRAN) IV, pentafluoroprop-tetrafluoroeth, sodium chloride flush, vancomycin, vecuronium  Assessment/ Plan:  66 y.o. female with medical problems of  End-stage renal disease, on dialysis since 2015, hypothyroidism, hyperlipidemia, diabetes, peripheral neuropathy, morbid obesity, mono, gammopathy, ischemic optic neuropathy, regarding oral thrush, COVID-19 in September 2022 history of endometrial carcinoma, obstructive sleep apnea was admitted on 01/02/2022 for  Principal Problem:   Acute metabolic encephalopathy Active Problems:   Chronic home hemodialysis status (HCC)   Hypertensive urgency   Non-compliance with renal dialysis (HCC)   Anemia of chronic kidney failure, stage 5 (HCC)   Elevated troponin   Fluid overload   Involuntary commitment   Type 2 diabetes mellitus with kidney complication, with long-term current use of insulin (HCC)   Hypothyroidism   Endometrial cancer ([place])   H/O radioactive iodine thyroid ablation   Acute respiratory failure ([place])   Endotracheally intubated   On mechanically assisted ventilation (HCC)  Acute metabolic encephalopathy [N39.76]  #. ESRD, with hyperkalemia Family considering comfort care.  If this is the case we will hold off on further dialysis treatments.  #. Anemia of CKD  Lab Results  Component Value Date   HGB 7.7 (L) 01/26/2022   Hemoglobin down a bit to 7.7.  Continue to monitor CBC periodically.  Phosphorus corrected down to 3.2.    #. Secondary hyperparathyroidism of renal origin N 25.81      Component Value Date/Time   PTH 492 (H) 01/20/2022 1304   Lab Results  Component Value Date   PHOS 3.2 01/26/2022   Serum phosphorus down a bit further to 5.7.  Continue to monitor bone mineral metabolism parameters.   #. Diabetes type 2 with CKD Hgb A1c  MFr Bld (%)  Date Value  01/18/2022 9.2 (H)  Management as per primary team.  Most recent hemoglobin A1c was high at 9.2 as above.  #Severe hypothyroidism, myxedema coma Patient transitioned to PO synthroid.   #Acute respiratory failure. Family considering comfort care.  Still on the ventilator at the moment.  LOS: 9 Hillman Attig 2/2/20237:40 AM  [place], [place]

## 2022-01-27 DIAGNOSIS — I6782 Cerebral ischemia: Secondary | ICD-10-CM

## 2022-01-27 LAB — RENAL FUNCTION PANEL
Albumin: 1.7 g/dL — ABNORMAL LOW (ref 3.5–5.0)
Anion gap: 14 (ref 5–15)
BUN: 89 mg/dL — ABNORMAL HIGH (ref 8–23)
CO2: 22 mmol/L (ref 22–32)
Calcium: 8.9 mg/dL (ref 8.9–10.3)
Chloride: 90 mmol/L — ABNORMAL LOW (ref 98–111)
Creatinine, Ser: 5.11 mg/dL — ABNORMAL HIGH (ref 0.44–1.00)
GFR, Estimated: 9 mL/min — ABNORMAL LOW (ref >60)
Glucose, Bld: 270 mg/dL — ABNORMAL HIGH (ref 70–99)
Phosphorus: 5.6 mg/dL — ABNORMAL HIGH (ref 2.5–4.6)
Potassium: 4.1 mmol/L (ref 3.5–5.1)
Sodium: 126 mmol/L — ABNORMAL LOW (ref 135–145)

## 2022-01-27 LAB — CBC
HCT: 23.9 % — ABNORMAL LOW (ref 36.0–46.0)
Hemoglobin: 7.6 g/dL — ABNORMAL LOW (ref 12.0–15.0)
MCH: 29.9 pg (ref 26.0–34.0)
MCHC: 31.8 g/dL (ref 30.0–36.0)
MCV: 94.1 fL (ref 80.0–100.0)
Platelets: 200 10*3/uL (ref 150–400)
RBC: 2.54 MIL/uL — ABNORMAL LOW (ref 3.87–5.11)
RDW: 14.4 % (ref 11.5–15.5)
WBC: 35.3 10*3/uL — ABNORMAL HIGH (ref 4.0–10.5)
nRBC: 1 % — ABNORMAL HIGH (ref 0.0–0.2)

## 2022-01-27 LAB — THYROID PANEL WITH TSH
Free Thyroxine Index: 0.9 — ABNORMAL LOW (ref 1.2–4.9)
T3 Uptake Ratio: 31 % (ref 24–39)
T4, Total: 2.9 ug/dL — ABNORMAL LOW (ref 4.5–12.0)
TSH: 13.4 u[IU]/mL — ABNORMAL HIGH (ref 0.450–4.500)

## 2022-01-27 LAB — GLUCOSE, CAPILLARY
Glucose-Capillary: 255 mg/dL — ABNORMAL HIGH (ref 70–99)
Glucose-Capillary: 260 mg/dL — ABNORMAL HIGH (ref 70–99)
Glucose-Capillary: 253 mg/dL — ABNORMAL HIGH (ref 70–99)
Glucose-Capillary: 246 mg/dL — ABNORMAL HIGH (ref 70–99)
Glucose-Capillary: 223 mg/dL — ABNORMAL HIGH (ref 70–99)

## 2022-01-27 MED ORDER — GLYCOPYRROLATE 0.2 MG/ML IJ SOLN: 0.2000 mg | INTRAMUSCULAR | Status: DC | PRN

## 2022-01-27 MED ORDER — ALBUMIN HUMAN 25 % IV SOLN
25.0000 g | Freq: Four times a day (QID) | INTRAVENOUS | Status: DC
  Administered 2022-01-27: 25 g via INTRAVENOUS
  Filled 2022-01-27: qty 100

## 2022-01-27 MED ORDER — DIPHENHYDRAMINE HCL 50 MG/ML IJ SOLN: 25.0000 mg | INTRAMUSCULAR | Status: DC | PRN

## 2022-01-27 MED ORDER — DEXTROSE 5 % IV SOLN: INTRAVENOUS | Status: DC

## 2022-01-27 MED ORDER — POLYVINYL ALCOHOL 1.4 % OP SOLN: 1.0000 [drp] | Freq: Four times a day (QID) | OPHTHALMIC | Status: DC | PRN

## 2022-01-27 MED ORDER — GLYCOPYRROLATE 1 MG PO TABS
1.0000 mg | ORAL_TABLET | ORAL | Status: DC | PRN
  Filled 2022-01-27: qty 1

## 2022-01-28 DIAGNOSIS — J15211 Pneumonia due to Methicillin susceptible Staphylococcus aureus: Secondary | ICD-10-CM

## 2022-02-22 NOTE — Progress Notes (Signed)
Neuro: Patient unresponsive throughout shift. Slight head movement during mouth care. Pupils non reactive.  Resp: Vented at 50%, Peep 8 and Tidal volume of 450. Lung sounds diminished. Occasional abdominal breathing. Cardio: Sr-SB GI/GU: foley with scant output Skin: Scattered bruising, dry Psych: appropriate for situation Events: Family at bedside throughout day to visit with patient. Patient will transition to comfort care Sunday at 10am per MD.

## 2022-02-22 NOTE — Progress Notes (Signed)
Patient had 19 beat run of SVT. MD Kasa notified and said to monitor, no interventions at this time.

## 2022-02-22 NOTE — Progress Notes (Deleted)
°   02-04-2022 1500  Clinical Encounter Type  Visited With Patient  Visit Type Follow-up  Referral From Nurse  Consult/Referral To Chaplain   Chaplain Machell Wirthlin responded to nurse consult. No family is present. Patient is moving to comfort care. Chaplain provided compassionate presence and silent prayer at bedside. Family will be coming this weekend. Chaplain on call will follow up.

## 2022-02-22 NOTE — Death Summary Note (Signed)
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 status ([place])   Hypertensive urgency   Acute metabolic encephalopathy   Non-compliance with renal dialysis ([place])   Anemia of chronic kidney failure, stage 5 (HCC)   Elevated troponin   Fluid overload   Involuntary commitment   OSA (obstructive sleep apnea)   Type 2 diabetes mellitus with kidney complication, with long-term current use of insulin ([place])   Hypothyroidism   Endometrial cancer ([place])   H/O radioactive iodine thyroid ablation   Acute hypercapnic respiratory failure ([place])   Endotracheally intubated   On mechanically assisted ventilation ([place])   Brain damage   Cor pulmonale, acute ([place])   Staphylococcus aureus pneumonia [facility])   Brief Hospital Course (including significant findings, care, treatment, and services provided and events leading to death)  Regina Jackson is a 66 y.o. year old female who was brought in [facility] ED via EMS under IVC for altered mental status and increasing agitation on 02/13/22.  On admission patient was also had hypertensive urgency & it was noted the patient had been noncompliant with intermittent hemodialysis for ESRD over the past week.  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  Nephrology and endocrinology both consulted.  On 01/18/2022 nephrology was unable to safely perform dialysis due  to severe agitation and confusion.  On 01/19/2022 patient received IV Haldol and Valium without improvement in mental status and psychiatry was consulted.  On 01/20/2022 patient required emergent intubation due to altered mental status and acute respiratory distress, neurology consulted.  Patient also requiring vasopressor support for shock. 01/21/2022 patient remains on mechanical ventilation but poorly responsive MRI positive for acute punctuate infarcts in the left inferior frontal gyrus, left occipital lobe and right temporal lobe.  EEG found continuous generalized slowing suggestive of moderate diffuse encephalopathy nonspecific to etiology without seizures noted.  TTE showed no intracardiac clot and MRA head no hemodynamically significant stenoses.  01/23/2022 respiratory culture positive for staph aureus and antibiotics adjusted appropriately. Palliative care consulted for ongoing goals of care discussions.  On 2022-02-23 family decided to transition to comfort measures on Sunday, 01/29/2022.  Overnight on 23-Feb-2022 patient began to deteriorate while on mechanical ventilation and vasopressor support.  Family was called bedside due to concerns for impending cardiac arrest.  Decision was made to transition the patient to comfort measures, patient passed quickly with family bedside and chaplain services for support.  Pertinent Labs and Studies  Significant Diagnostic Studies DG Abd 1 View  Result Date: 01/20/2022 CLINICAL DATA:  Nasogastric tube placement. EXAM: ABDOMEN - 1 VIEW COMPARISON:  Chest radiographs today and 02-13-2022. FINDINGS: 1026 hours. Nasogastric tube projects below the diaphragm, tip projected over the mid stomach. The visualized bowel gas pattern is normal. Asymmetric left basilar pulmonary opacity, likely atelectasis with a possible small pleural effusion. IMPRESSION: Nasogastric tube projects over the mid stomach. Electronically Signed   By: Richardean Sale M.D.   On: 01/20/2022 10:49  CT  Head Wo Contrast  Result Date: 12/29/2021 CLINICAL DATA:  Mental status change, unknown cause EXAM: CT HEAD WITHOUT CONTRAST TECHNIQUE: Contiguous axial images were obtained from the base of the skull through the vertex without intravenous contrast. RADIATION DOSE REDUCTION: This exam was performed according to the departmental dose-optimization program which includes automated exposure control, adjustment of the mA and/or kV according to patient size and/or use of iterative reconstruction technique. COMPARISON:  None. FINDINGS: Brain: No intracranial hemorrhage, mass effect, or midline shift. No hydrocephalus. The basilar cisterns are patent. Mild periventricular and deep white matter hypodensity typical of chronic small vessel ischemia. No evidence of territorial infarct or acute ischemia. No extra-axial or intracranial fluid collection. Vascular: Atherosclerosis of skullbase vasculature without hyperdense vessel or abnormal calcification. Skull: Sclerotic cortical thickening of the outer table of the right occipital bone, nonaggressive in appearance. Frontal hyperostosis. No fracture or suspicious bone lesion. Sinuses/Orbits: Paranasal sinuses and mastoid air cells are clear. The visualized orbits are unremarkable. Other: None. IMPRESSION: 1. No acute intracranial abnormality. 2. Mild chronic small vessel ischemia. Electronically Signed   By: Keith Rake M.D.   On: 01/23/2022 22:32   MR ANGIO HEAD WO CONTRAST  Result Date: 01/24/2022 CLINICAL DATA:  Neuro deficit, stroke suspected EXAM: MRA HEAD WITHOUT CONTRAST TECHNIQUE: Angiographic images of the Circle of Willis were acquired using MRA technique without intravenous contrast. COMPARISON:  No prior MRI, correlation is made with MRI 01/21/2022 FINDINGS: Anterior circulation: Both internal carotid arteries are patent to the termini, without stenosis or other abnormality. A1 segments patent. Normal anterior communicating artery. Anterior cerebral  arteries are patent to their distal aspects. No M1 stenosis or occlusion. Normal MCA bifurcations. Distal MCA branches perfused and symmetric. Posterior circulation: Right dominant. Diminutive left V4 segment. Vertebral arteries patent to the vertebrobasilar junction without stenosis. Apparent discontinuity in the proximal V4 segments is favored to be secondary to motion artifact. Posterior inferior cerebral arteries patent bilaterally. Basilar patent to its distal aspect. Superior cerebellar arteries patent bilaterally. Bilateral P1 segments originate from the basilar artery. PCAs perfused to their distal aspects without stenosis. Bilateral posterior communicating arteries are visualized. Anatomic variants: None significant Other: None. IMPRESSION: No intracranial large vessel occlusion or significant stenosis. Electronically Signed   By: Merilyn Baba M.D.   On: 01/24/2022 01:10   MR BRAIN WO CONTRAST  Result Date: 01/24/2022 CLINICAL DATA:  Stroke suspected altered mental status and increased agitation EXAM: MRI HEAD WITHOUT CONTRAST TECHNIQUE: Multiplanar, multiecho pulse sequences of the brain and surrounding structures were obtained without intravenous contrast. COMPARISON:  01/21/2022 MRI FINDINGS: Brain: Numerous new foci of restricted diffusion in the bilateral cerebral hemispheres, which are new from the prior exam and demonstrate ADC correlates. Some of these are in the ACA-PCA watershed territory (series 5, image 33-35), while others are cortical, as seen in the right frontal lobe (series 5, image 21, right temporal lobe (series 5, image 25 right occipital lobe (series 5, image 21), and left occipital lobe (series 5, image 23). Redemonstrated subacute infarcts seen on the prior exam, including in the left frontal lobe and right posterior temporal lobe. No acute hemorrhage, mass, mass effect, or midline shift. No hydrocephalus or extra-axial collection. Vascular: Normal flow voids. Skull and upper  cervical spine: Normal marrow signal. Sinuses/Orbits: Mucosal thickening in the sphenoid sinuses, with air-fluid level in the left sphenoid sinus. The sinuses are otherwise largely clear. The orbits are unremarkable. Other: Fluid throughout the bilateral mastoid air cells. IMPRESSION: 1. 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 Given multiple vascular territories, a central embolic etiology should be considered. 2. Air-fluid level in the left sphenoid sinus, as can be seen with acute sinusitis. Correlate with symptoms. Impression #1 will be called to the ordering clinician or representative by the Radiologist Assistant, and communication documented in the PACS or [employer]. Electronically Signed   By: Merilyn Baba M.D.   On: 01/24/2022 23:33   MR BRAIN WO CONTRAST  Result Date: 01/21/2022 CLINICAL DATA:  Altered mental status EXAM: MRI HEAD WITHOUT CONTRAST TECHNIQUE: Multiplanar, multiecho pulse sequences of the brain and surrounding structures were obtained without intravenous contrast. COMPARISON:  CT head 12/31/2021 FINDINGS: Brain: There is a punctate acute to early subacute infarct in the left inferior frontal gyrus. There is a small acute to subacute infarct in the right posterior temporal lobe periventricular white matter (5-27). There is a punctate acute to subacute infarct in the left occipital lobe (5-19). There is no associated hemorrhage or mass effect. There is no other evidence of acute infarct. There is no acute intracranial hemorrhage or extra-axial fluid collection Background parenchymal volume is normal. The ventricles are normal in size. Patchy foci of FLAIR signal abnormality in the subcortical and periventricular white matter likely reflects sequela of mild chronic white matter microangiopathy. There  is no mass lesion.  There is no midline shift. Vascular: Normal flow voids. Skull and upper cervical spine: Thickening of the right occipital cortex is again seen, without aggressive features. There is no suspicious marrow signal abnormality. Sinuses/Orbits: The paranasal sinuses are clear. The globes and orbits are unremarkable. Other: None. IMPRESSION: 1. 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 No associated hemorrhage or mass effect. 2. Mild chronic white matter microangiopathy. Electronically Signed   By: Valetta Mole M.D.   On: 01/21/2022 15:17   DG Chest Port 1 View  Result Date: 01/26/2022 CLINICAL DATA:  Acute respiratory failure with hypoxia. EXAM: PORTABLE CHEST 1 VIEW COMPARISON:  01/23/2022. FINDINGS: Patient rotated to the right. Endotracheal tube tip at the level of the clavicular heads. Gastric tube courses below the diaphragm with the tip outside the field of view. Hazy left basilar opacities. Cardiomediastinal silhouette is similar. Similar position of a left IJ central venous catheter with tip projecting in the region of the proximal SVC. IMPRESSION: Hazy left basilar opacities, which may represent layering pleural effusion with overlying atelectasis and/or pneumonia. Electronically Signed   By: Margaretha Sheffield M.D.   On: 01/26/2022 08:14   DG Chest Port 1 View  Result Date: 01/23/2022 CLINICAL DATA:  Hypoxia, intubated EXAM: PORTABLE CHEST 1 VIEW COMPARISON:  01/21/2022 FINDINGS: Endotracheal tube at the thoracic inlet, 8 cm above the carina. Enteric tube courses into the mid stomach. Cardiomegaly with pulmonary vascular congestion. No frank interstitial edema. Small left pleural effusion. No pneumothorax. Left IJ venous catheter terminates in the proximal SVC. IMPRESSION: Endotracheal tube at the thoracic inlet, 8 cm above the carina. Additional support apparatus as above. Pulmonary  vascular congestion without frank interstitial edema. Small left pleural effusion. Electronically Signed   By: Julian Hy M.D.   On: 01/23/2022 03:10   DG Chest Port 1 View  Result Date: 01/21/2022 CLINICAL DATA:  Acute respiratory failure EXAM: PORTABLE CHEST 1 VIEW COMPARISON:  Film from the previous day. FINDINGS: Endotracheal tube, gastric catheter and left jugular central line are again seen and stable. Cardiac shadow is stable. Lungs are well aerated bilaterally with mild basilar atelectasis. No other focal abnormality is noted. IMPRESSION: Mild bibasilar atelectasis. Tubes and lines in satisfactory position. Electronically Signed   By: Inez Catalina M.D.   On: 01/21/2022 01:21   Portable Chest x-ray  Result Date: 01/20/2022 CLINICAL DATA:  Central line placement. Endotracheal tube placement. EXAM: PORTABLE CHEST 1 VIEW COMPARISON:  Radiographs 01/07/2022 FINDINGS: 1027 hours. Endotracheal tube tip overlies the mid trachea. There is a new left IJ central venous catheter which has an atypical course, projecting over the aortic arch with its tip overlying the right mainstem bronchus. A nasogastric tube projects below the diaphragm, tip not visualized on this examination. The heart size and mediastinal contours are stable allowing for mild patient rotation. No evidence of mediastinal hematoma. There are increased opacities at both lung bases, likely atelectasis. There is a small left pleural effusion. No evidence of pneumothorax. IMPRESSION: 1. New left IJ central venous catheter follows an atypical course and cannot be confirmed to be intravenous by a single AP radiograph. Tip overlies the right mainstem bronchus. Correlate for venous blood return from the catheter. 2. Satisfactory position of the endotracheal tube. 3. Increased bibasilar atelectasis.  No pneumothorax. 4. These results will be called to the ordering clinician or representative by the Radiologist Assistant, and communication  documented in the PACS or [employer]. Electronically Signed   By: Richardean Sale M.D.   On: 01/20/2022 10:53   DG Chest Port 1 View  Result Date: 01/04/2022 CLINICAL DATA:  Missed dialysis EXAM: PORTABLE CHEST 1 VIEW COMPARISON:  None. FINDINGS: Mild cardiomegaly with vascular congestion. Possible small left effusion. No consolidation or pneumothorax IMPRESSION: Mild cardiomegaly with vascular congestion and possible small left effusion Electronically Signed   By: Donavan Foil M.D.   On: 01/05/2022 20:29   EEG adult  Result Date: 01/24/2022 Derek Jack, MD     01/24/2022  7:50 PM Routine EEG Report Regina Jackson is a 66 y.o. female with a history of severe encephalopathy who is undergoing an EEG to evaluate for seizures. Report: This EEG was acquired with electrodes placed according to the International 10-20 electrode system (including Fp1, Fp2, F3, F4, C3, C4, P3, P4, O1, O2, T3, T4, T5, T6, A1, A2, Fz, Cz, Pz). The following electrodes were missing or displaced: none. There was no clear waking rhythm and no sleep architecture. Best background was generalized 5-7 Hz theta slowing with intermittent 2-3 Hz delta slowing. There was no focal slowing. There were no interictal epileptiform discharges. There were no electrographic seizures identified. Photic stimulation and hyperventilation were not performed. Impression and clinical correlation: This EEG was obtained while sedated on fentanyl and comatose and was abnormal due to moderate-to-severe diffuse slowing. Epileptiform abnormalities were not seen during this recording. Su Monks, MD [facility] [phone] If 7pm- 7am, please page neurology on call as listed in [place].   EEG adult  Result Date: 01/21/2022 Lora Havens, MD     01/21/2022  8:17 AM Patient Name: Regina Jackson MRN: [id number] Epilepsy Attending: Lora Havens Referring Physician/Provider: Kerney Elbe, MD Date: 01/20/2022 Duration: 22.40 mins Patient  history: 66 year old female with persistent AMS since admission on Tuesday.  EEG to evaluate for seizure. Level of alertness: Awake, asleep AEDs during EEG study: Propofol Technical aspects: This EEG study was done with scalp  electrodes positioned according to the 10-20 International system of electrode placement. Electrical activity was acquired at a sampling rate of 500Hz  and reviewed with a high frequency filter of 70Hz  and a low frequency filter of 1Hz . EEG data were recorded continuously and digitally stored. Description: No clear posterior dominant rhythm was seen.Sleep was characterized by vertex waves, sleep spindles (12 to 14 Hz), maximal frontocentral region. EEG showed continuous generalized 5 to 7 Hz theta slowing admixed with intermittent 2-3hz  delta slowing.Hyperventilation and photic stimulation were not performed.   ABNORMALITY - Continuous slow, generalized IMPRESSION: This study is suggestive of moderate diffuse encephalopathy, nonspecific etiology. No seizures or epileptiform discharges were seen throughout the recording. Lora Havens   ECHOCARDIOGRAM COMPLETE  Result Date: 01/22/2022    ECHOCARDIOGRAM REPORT   Patient Name:   Regina Jackson Date of Exam: 01/22/2022 Medical Rec #:  [id number]      Height:       67.5 in Accession #:    [id number]     Weight:       252.2 lb Date of Birth:  02-Aug-1956      BSA:          2.244 m Patient Age:    79 years       BP:           122/61 mmHg Patient Gender: F              HR:           102 bpm. Exam Location:  [facility] Procedure: 2D Echo and Intracardiac Opacification Agent Indications:     Stroke  History:         Patient has no prior history of Echocardiogram examinations.                  Risk Factors:ERSD.  Sonographer:     L Thornton-Maynard Referring Phys:  [id number] O'Donnell L RUST-CHESTER Diagnosing Phys: Neoma Laming  Sonographer Comments: Echo performed with patient supine and on artificial respirator and patient is morbidly obese. Image acquisition  challenging due to patient body habitus. IMPRESSIONS  1. Left ventricular ejection fraction, by estimation, is 60 to 65%. The left ventricle has normal function. The left ventricle has no regional wall motion abnormalities. Left ventricular diastolic parameters are consistent with Grade III diastolic dysfunction (restrictive).  2. Right ventricular systolic function is normal. The right ventricular size is normal.  3. Left atrial size was mild to moderately dilated.  4. Right atrial size was mild to moderately dilated.  5. The mitral valve is normal in structure. Trivial mitral valve regurgitation. No evidence of mitral stenosis.  6. The aortic valve is normal in structure. Aortic valve regurgitation is not visualized. Aortic valve sclerosis is present, with no evidence of aortic valve stenosis.  7. The inferior vena cava is normal in size with greater than 50% respiratory variability, suggesting right atrial pressure of 3 mmHg. FINDINGS  Left Ventricle: Left ventricular ejection fraction, by estimation, is 60 to 65%. The left ventricle has normal function. The left ventricle has no regional wall motion abnormalities. Definity contrast agent was given IV to delineate the left ventricular  endocardial borders. The left ventricular internal cavity size was normal in size. There is no left ventricular hypertrophy. Left ventricular diastolic parameters are consistent with Grade III diastolic dysfunction (restrictive). Right Ventricle: The right ventricular size is normal. No increase in right ventricular wall thickness. Right ventricular systolic function is normal. Left Atrium: Left atrial size was  mild to moderately dilated. Right Atrium: Right atrial size was mild to moderately dilated. Pericardium: There is no evidence of pericardial effusion. Mitral Valve: The mitral valve is normal in structure. Trivial mitral valve regurgitation. No evidence of mitral valve stenosis. MV peak gradient, 5.2 mmHg. The mean mitral  valve gradient is 3.0 mmHg. Tricuspid Valve: The tricuspid valve is normal in structure. Tricuspid valve regurgitation is trivial. No evidence of tricuspid stenosis. Aortic Valve: The aortic valve is normal in structure. Aortic valve regurgitation is not visualized. Aortic valve sclerosis is present, with no evidence of aortic valve stenosis. Aortic valve mean gradient measures 11.0 mmHg. Aortic valve peak gradient measures 18.6 mmHg. Aortic valve area, by VTI measures 1.95 cm. Pulmonic Valve: The pulmonic valve was normal in structure. Pulmonic valve regurgitation is not visualized. No evidence of pulmonic stenosis. Aorta: The aortic root is normal in size and structure. Venous: The inferior vena cava is normal in size with greater than 50% respiratory variability, suggesting right atrial pressure of 3 mmHg. IAS/Shunts: No atrial level shunt detected by color flow Doppler.  LEFT VENTRICLE PLAX 2D LVIDd:         4.00 cm      Diastology LVIDs:         3.00 cm      LV e' medial:    5.11 cm/s LV PW:         1.70 cm      LV E/e' medial:  17.1 LV IVS:        2.10 cm      LV e' lateral:   5.87 cm/s LVOT diam:     2.30 cm      LV E/e' lateral: 14.9 LV SV:         57 LV SV Index:   25 LVOT Area:     4.15 cm  LV Volumes (MOD) LV vol d, MOD A2C: 130.0 ml LV vol d, MOD A4C: 79.1 ml LV vol s, MOD A2C: 24.4 ml LV vol s, MOD A4C: 16.0 ml LV SV MOD A2C:     105.6 ml LV SV MOD A4C:     79.1 ml LV SV MOD BP:      89.8 ml RIGHT VENTRICLE RV S prime:     17.90 cm/s TAPSE (M-mode): 1.8 cm LEFT ATRIUM             Index        RIGHT ATRIUM           Index LA diam:        2.80 cm 1.25 cm/m   RA Area:     13.40 cm LA Vol (A2C):   32.3 ml 14.39 ml/m  RA Volume:   31.40 ml  13.99 ml/m LA Vol (A4C):   39.9 ml 17.78 ml/m LA Biplane Vol: 37.6 ml 16.76 ml/m  AORTIC VALVE                     PULMONIC VALVE AV Area (Vmax):    1.64 cm      PV Vmax:       0.80 m/s AV Area (Vmean):   1.63 cm      PV Peak grad:  2.6 mmHg AV Area (VTI):      1.95 cm AV Vmax:           215.50 cm/s AV Vmean:          152.000 cm/s AV VTI:  0.290 m AV Peak Grad:      18.6 mmHg AV Mean Grad:      11.0 mmHg LVOT Vmax:         84.90 cm/s LVOT Vmean:        59.700 cm/s LVOT VTI:          0.136 m LVOT/AV VTI ratio: 0.47  AORTA Ao Root diam: 3.70 cm MITRAL VALVE MV Area (PHT): 4.68 cm    SHUNTS MV Area VTI:   2.08 cm    Systemic VTI:  0.14 m MV Peak grad:  5.2 mmHg    Systemic Diam: 2.30 cm MV Mean grad:  3.0 mmHg MV Vmax:       1.14 m/s MV Vmean:      86.1 cm/s MV Decel Time: 162 msec MV E velocity: 87.40 cm/s MV A velocity: 96.00 cm/s MV E/A ratio:  0.91 Shaukat Khan Electronically signed by Neoma Laming Signature Date/Time: 01/22/2022/12:00:53 PM    Final     Microbiology Recent Results (from the past 240 hour(s))  MRSA Next Gen by PCR, Nasal     Status: None   Collection Time: 01/20/22  8:50 AM   Specimen: Nasal Mucosa; Nasal Swab  Result Value Ref Range Status   MRSA by PCR Next Gen NOT DETECTED NOT DETECTED Final    Comment: (NOTE) The GeneXpert MRSA Assay (FDA approved for NASAL specimens only), is one component of a comprehensive MRSA colonization surveillance program. It is not intended to diagnose MRSA infection nor to guide or monitor treatment for MRSA infections. Test performance is not FDA approved in patients less than 52 years old. Performed at [facility], [address] [place], [place] [address]   Culture, Respiratory w Gram Stain     Status: None   Collection Time: 01/21/22  2:46 AM   Specimen: Tracheal Aspirate; Respiratory  Result Value Ref Range Status   Specimen Description   Final    TRACHEAL ASPIRATE Performed at [facility], [address], [place], [place] [address]    Special Requests   Final    NONE Performed at [facility], [address] [place], [place] [address]    Gram Stain   Final    ABUNDANT WBC PRESENT, PREDOMINANTLY PMN ABUNDANT GRAM POSITIVE COCCI IN  CLUSTERS Performed at [facility], [place] [address] [place], [place] [address]    Culture ABUNDANT STAPHYLOCOCCUS AUREUS  Final   Report Status 01/23/2022 FINAL  Final   Organism ID, Bacteria STAPHYLOCOCCUS AUREUS  Final      Susceptibility   Staphylococcus aureus - MIC*    CIPROFLOXACIN <=0.5 SENSITIVE Sensitive     ERYTHROMYCIN <=0.25 SENSITIVE Sensitive     GENTAMICIN <=0.5 SENSITIVE Sensitive     OXACILLIN <=0.25 SENSITIVE Sensitive     TETRACYCLINE <=1 SENSITIVE Sensitive     VANCOMYCIN <=0.5 SENSITIVE Sensitive     TRIMETH/SULFA <=10 SENSITIVE Sensitive     CLINDAMYCIN <=0.25 SENSITIVE Sensitive     RIFAMPIN <=0.5 SENSITIVE Sensitive     Inducible Clindamycin NEGATIVE Sensitive     * ABUNDANT STAPHYLOCOCCUS AUREUS    Lab Basic Metabolic Panel: Recent Labs  Lab 01/21/22 0537 01/22/22 0340 01/23/22 0407 01/24/22 0427 01/25/22 0422 01/26/22 0503 02-04-22 0506  NA  --  132* 135 130* 127* 131* 126*  K  --  3.6 3.7 3.5 3.6 2.9* 4.1  CL  --  101 99 93* 90* 96* 90*  CO2  --  20* 22  26 22 23 22   GLUCOSE  --  230* 235* 289* 357* 313* 270*  BUN  --  58* 86* 58* 98* 69* 89*  CREATININE  --  6.22* 7.47* 5.14* 6.06* 3.96* 5.11*  CALCIUM  --  8.8* 8.8* 8.7* 8.9 8.4* 8.9  MG 1.8 1.8 1.7 1.7 2.2  --   --   PHOS  --  6.3* 7.1* 6.0* 5.7* 3.2 5.6*   Liver Function Tests: Recent Labs  Lab 01/21/22 0530 01/22/22 0340 01/23/22 0407 01/26/22 0503 23-Feb-2022 0506  ALBUMIN 2.9* 2.6* 2.3* 1.8* 1.7*   No results for input(s): LIPASE, AMYLASE in the last 168 hours. No results for input(s): AMMONIA in the last 168 hours. CBC: Recent Labs  Lab 01/23/22 0407 01/24/22 0427 01/25/22 0422 01/26/22 0503 02-23-2022 0506  WBC 20.2* 24.0* 21.3* 29.3* 35.3*  NEUTROABS  --   --  17.7*  --   --   HGB 8.6* 8.2* 7.5* 7.7* 7.6*  HCT 27.6* 25.4* 23.6* 23.8* 23.9*  MCV 97.5 94.8 94.4 94.1 94.1  PLT 183 196 206 223 200   Cardiac Enzymes: No results for input(s): CKTOTAL, CKMB,  CKMBINDEX, TROPONINI in the last 168 hours. Sepsis Labs: Recent Labs  Lab 01/21/22 0537 01/22/22 0340 01/23/22 0407 01/24/22 0427 01/25/22 0422 01/26/22 0503 02-23-2022 0506  PROCALCITON 0.80 1.86  --   --   --   --   --   WBC 15.1* 17.8*   < > 24.0* 21.3* 29.3* 35.3*   < > = values in this interval not displayed.    Procedures/Operations  01/20/22: CVC placement 01/20/22: ETT placement 01/24/22: EEG   Domingo Pulse [place], AGACNP-BC Acute Care Nurse Practitioner [place]   [phone] / [phone] Please see Amion for pager details.   Lorisa Scheid L Rust-Chester February 23, 2022, 11:37 PM

## 2022-02-22 NOTE — Progress Notes (Signed)
Goals of Care  Family called bedside as the patient's clinical status worsened. Patient increasingly bradycardic with pauses, hypotensive & hypoxic on mechanical ventilation. The Clinical status was relayed to family in detail.  Patient is in the Dying  Process associated with Suffering.  Family understands the situation.  They have consented and agreed to proceed with Comfort care measures.  Family are satisfied with Plan of action and management. All questions answered    Domingo Pulse [place], AGACNP-BC [facility]    Please see Amion for pager details.

## 2022-02-22 NOTE — Progress Notes (Signed)
[facility], [place] Feb 05, 2022  Subjective:   LOS: 10  Patient seen and evaluated at bedside. She remains critically ill. Family has decided upon comfort care. Therefore no further dialysis to be pursued at this time.  Objective:  Vital signs in last 24 hours:  Temp:  [99.8 F (37.7 C)-101.6 F (38.7 C)] 99.8 F (37.7 C) (02/03 0400) Pulse Rate:  [88-109] 102 (02/03 0630) Resp:  [18-34] 26 (02/03 0630) BP: (63-115)/(15-61) 108/54 (02/03 0630) SpO2:  [89 %-97 %] 92 % (02/03 0837) FiO2 (%):  [50 %] 50 % (02/03 0837) Weight:  [127.8 kg] 127.8 kg (02/03 0600)  Weight change: 9.2 kg Filed Weights   01/25/22 0945 01/25/22 1315 02-05-22 0600  Weight: 118.6 kg 117.5 kg 127.8 kg    Intake/Output:    Intake/Output Summary (Last 24 hours) at 05-Feb-2022 0932 Last data filed at 05-Feb-2022 0600 Gross per 24 hour  Intake 1981.42 ml  Output 0 ml  Net 1981.42 ml      Physical Exam: General: Critically ill-appearing  HEENT Endotracheal tube in place  Pulm/lungs Scattered rhonchi, vent assisted  CVS/Heart S1S2 no rub  Abdomen:  Soft, nontender  Extremities: Trace lower extremity edema  Neurologic: Intubated, not following commands  Skin: Warm, dry  Access: Left arm AV fistula       Basic Metabolic Panel:  Recent Labs  Lab 01/21/22 0537 01/22/22 0340 01/23/22 0407 01/24/22 0427 01/25/22 0422 01/26/22 0503 2022/02/05 0506  NA  --  132* 135 130* 127* 131* 126*  K  --  3.6 3.7 3.5 3.6 2.9* 4.1  CL  --  101 99 93* 90* 96* 90*  CO2  --  20* $Re'22 26 22 23 22  'idC$ GLUCOSE  --  230* 235* 289* 357* 313* 270*  BUN  --  58* 86* 58* 98* 69* 89*  CREATININE  --  6.22* 7.47* 5.14* 6.06* 3.96* 5.11*  CALCIUM  --  8.8* 8.8* 8.7* 8.9 8.4* 8.9  MG 1.8 1.8 1.7 1.7 2.2  --   --   PHOS  --  6.3* 7.1* 6.0* 5.7* 3.2 5.6*      CBC: Recent Labs  Lab 01/23/22 0407 01/24/22 0427 01/25/22 0422 01/26/22 0503 05-Feb-2022 0506  WBC 20.2* 24.0* 21.3* 29.3* 35.3*   NEUTROABS  --   --  17.7*  --   --   HGB 8.6* 8.2* 7.5* 7.7* 7.6*  HCT 27.6* 25.4* 23.6* 23.8* 23.9*  MCV 97.5 94.8 94.4 94.1 94.1  PLT 183 196 206 223 200       Lab Results  Component Value Date   HEPBSAG NON REACTIVE 01/20/2022   HEPBSAB Reactive (A) 01/20/2022      Microbiology:  Recent Results (from the past 240 hour(s))  Resp Panel by RT-PCR (Flu A&B, Covid) Nasopharyngeal Swab     Status: None   Collection Time: 01/10/2022  8:30 PM   Specimen: Nasopharyngeal Swab; Nasopharyngeal(NP) swabs in vial transport medium  Result Value Ref Range Status   SARS Coronavirus 2 by RT PCR NEGATIVE NEGATIVE Final    Comment: (NOTE) SARS-CoV-2 target nucleic acids are NOT DETECTED.  The SARS-CoV-2 RNA is generally detectable in upper respiratory specimens during the acute phase of infection. The lowest concentration of SARS-CoV-2 viral copies this assay can detect is 138 copies/mL. A negative result does not preclude SARS-Cov-2 infection and should not be used as the sole basis for treatment or other patient management decisions. A negative result may occur with  improper specimen  collection/handling, submission of specimen other than nasopharyngeal swab, presence of viral mutation(s) within the areas targeted by this assay, and inadequate number of viral copies(<138 copies/mL). A negative result must be combined with clinical observations, patient history, and epidemiological information. The expected result is Negative.  Fact Sheet for Patients:  EntrepreneurPulse.com.au  Fact Sheet for Healthcare Providers:  IncredibleEmployment.be  This test is no t yet approved or cleared by the [place] FDA and  has been authorized for detection and/or diagnosis of SARS-CoV-2 by FDA under an Emergency Use Authorization (EUA). This EUA will remain  in effect (meaning this test can be used) for the duration of the COVID-19 declaration under Section  564(b)(1) of the Act, 21 U.S.C.section 360bbb-3(b)(1), unless the authorization is terminated  or revoked sooner.       Influenza A by PCR NEGATIVE NEGATIVE Final   Influenza B by PCR NEGATIVE NEGATIVE Final    Comment: (NOTE) The Xpert Xpress SARS-CoV-2/FLU/RSV plus assay is intended as an aid in the diagnosis of influenza from Nasopharyngeal swab specimens and should not be used as a sole basis for treatment. Nasal washings and aspirates are unacceptable for Xpert Xpress SARS-CoV-2/FLU/RSV testing.  Fact Sheet for Patients: EntrepreneurPulse.com.au  Fact Sheet for Healthcare Providers: IncredibleEmployment.be  This test is not yet approved or cleared by the [place] FDA and has been authorized for detection and/or diagnosis of SARS-CoV-2 by FDA under an Emergency Use Authorization (EUA). This EUA will remain in effect (meaning this test can be used) for the duration of the COVID-19 declaration under Section 564(b)(1) of the Act, 21 U.S.C. section 360bbb-3(b)(1), unless the authorization is terminated or revoked.  Performed at [facility], [address] [place], [place] [address]   MRSA Next Gen by PCR, Nasal     Status: None   Collection Time: 01/20/22  8:50 AM   Specimen: Nasal Mucosa; Nasal Swab  Result Value Ref Range Status   MRSA by PCR Next Gen NOT DETECTED NOT DETECTED Final    Comment: (NOTE) The GeneXpert MRSA Assay (FDA approved for NASAL specimens only), is one component of a comprehensive MRSA colonization surveillance program. It is not intended to diagnose MRSA infection nor to guide or monitor treatment for MRSA infections. Test performance is not FDA approved in patients less than 3 years old. Performed at [facility], [address] [place], [place] [address]   Culture, Respiratory w Gram Stain     Status: None   Collection Time: 01/21/22  2:46 AM   Specimen: Tracheal Aspirate;  Respiratory  Result Value Ref Range Status   Specimen Description   Final    TRACHEAL ASPIRATE Performed at [facility], [address], [place], [place] [address]    Special Requests   Final    NONE Performed at [facility], [address] [place], [place] [address]    Gram Stain   Final    ABUNDANT WBC PRESENT, PREDOMINANTLY PMN ABUNDANT GRAM POSITIVE COCCI IN CLUSTERS Performed at [facility], [place] [address], [place],  [address]    Culture ABUNDANT STAPHYLOCOCCUS AUREUS  Final   Report Status 01/23/2022 FINAL  Final   Organism ID, Bacteria STAPHYLOCOCCUS AUREUS  Final      Susceptibility   Staphylococcus aureus - MIC*    CIPROFLOXACIN <=0.5 SENSITIVE Sensitive     ERYTHROMYCIN <=0.25 SENSITIVE Sensitive     GENTAMICIN <=0.5 SENSITIVE Sensitive     OXACILLIN <=0.25 SENSITIVE Sensitive  TETRACYCLINE <=1 SENSITIVE Sensitive     VANCOMYCIN <=0.5 SENSITIVE Sensitive     TRIMETH/SULFA <=10 SENSITIVE Sensitive     CLINDAMYCIN <=0.25 SENSITIVE Sensitive     RIFAMPIN <=0.5 SENSITIVE Sensitive     Inducible Clindamycin NEGATIVE Sensitive     * ABUNDANT STAPHYLOCOCCUS AUREUS    Coagulation Studies: No results for input(s): LABPROT, INR in the last 72 hours.  Urinalysis: No results for input(s): COLORURINE, LABSPEC, PHURINE, GLUCOSEU, HGBUR, BILIRUBINUR, KETONESUR, PROTEINUR, UROBILINOGEN, NITRITE, LEUKOCYTESUR in the last 72 hours.  Invalid input(s): APPERANCEUR     Imaging: DG Chest Port 1 View  Result Date: 01/26/2022 CLINICAL DATA:  Acute respiratory failure with hypoxia. EXAM: PORTABLE CHEST 1 VIEW COMPARISON:  01/23/2022. FINDINGS: Patient rotated to the right. Endotracheal tube tip at the level of the clavicular heads. Gastric tube courses below the diaphragm with the tip outside the field of view. Hazy left basilar opacities. Cardiomediastinal silhouette is similar. Similar position of a left IJ central venous catheter with tip  projecting in the region of the proximal SVC. IMPRESSION: Hazy left basilar opacities, which may represent layering pleural effusion with overlying atelectasis and/or pneumonia. Electronically Signed   By: Margaretha Sheffield M.D.   On: 01/26/2022 08:14     Medications:    sodium chloride     feeding supplement (VITAL AF 1.2 CAL) 1,000 mL (01/25/22 2325)   fentaNYL infusion INTRAVENOUS 200 mcg/hr (23-Feb-2022 0600)   norepinephrine (LEVOPHED) Adult infusion 6 mcg/min (23-Feb-2022 4680)   vancomycin Stopped (01/23/22 1905)    albuterol  2.5 mg Nebulization Q6H   aspirin  81 mg Per Tube Daily   chlorhexidine gluconate (MEDLINE KIT)  15 mL Mouth Rinse BID   Chlorhexidine Gluconate Cloth  6 each Topical Q0600   docusate  100 mg Per Tube BID   feeding supplement (PROSource TF)  90 mL Per Tube TID   free water  30 mL Per Tube Q4H   heparin  5,000 Units Subcutaneous Q8H   insulin aspart  0-20 Units Subcutaneous Q4H   insulin glargine-yfgn  8 Units Subcutaneous BID   levothyroxine  200 mcg Per Tube Q0600   mouth rinse  15 mL Mouth Rinse 10 times per day   midodrine  10 mg Per Tube TID WC   multivitamin  1 tablet Per Tube QHS   pantoprazole (PROTONIX) IV  40 mg Intravenous Daily   polyethylene glycol  17 g Per Tube Daily   sodium chloride flush  10-40 mL Intracatheter Q12H   sodium chloride, acetaminophen **OR** acetaminophen, alteplase, fentaNYL, fentaNYL (SUBLIMAZE) injection, heparin, lidocaine (PF), lidocaine-prilocaine, LORazepam, midazolam, ondansetron **OR** ondansetron (ZOFRAN) IV, pentafluoroprop-tetrafluoroeth, sodium chloride flush, vancomycin, vecuronium  Assessment/ Plan:  66 y.o. female with medical problems of  End-stage renal disease, on dialysis since 2015, hypothyroidism, hyperlipidemia, diabetes, peripheral neuropathy, morbid obesity, mono, gammopathy, ischemic optic neuropathy, regarding oral thrush, COVID-19 in September 2022 history of endometrial carcinoma, obstructive sleep  apnea was admitted on 01/05/2022 for  Principal Problem:   Ischemic cerebrovascular disease Active Problems:   Chronic home hemodialysis status ([place])   Hypertensive urgency   Acute metabolic encephalopathy   Non-compliance with renal dialysis (HCC)   Anemia of chronic kidney failure, stage 5 (HCC)   Elevated troponin   Fluid overload   Involuntary commitment   OSA (obstructive sleep apnea)   Type 2 diabetes mellitus with kidney complication, with long-term current use of insulin (HCC)   Hypothyroidism   Endometrial cancer ([place])   H/O radioactive iodine thyroid  ablation   Acute hypercapnic respiratory failure (HCC)   Endotracheally intubated   On mechanically assisted ventilation (HCC)   Brain damage   Cor pulmonale, acute ([place])  Acute metabolic encephalopathy [Y17.12]  #. ESRD, with hyperkalemia It appears at the family has opted for comfort care which will be initiated on Sunday.  Therefore we will not pursue any further dialysis treatment at this time.  #. Anemia of CKD  Lab Results  Component Value Date   HGB 7.6 (L) February 02, 2022   No additional need for Epogen at this time.  #. Secondary hyperparathyroidism of renal origin N 25.81      Component Value Date/Time   PTH 492 (H) 01/20/2022 1304   Lab Results  Component Value Date   PHOS 5.6 (H) 2022/02/02   Phosphorus rechecked this a.m. and found to be 5.6 which is close to target.  No further need to check phosphorus however.  Glucose management as per primary team.   #. Diabetes type 2 with CKD Hgb A1c MFr Bld (%)  Date Value  01/18/2022 9.2 (H)  Management as per primary team.  Most recent hemoglobin A1c was high at 9.2 as above.  #Severe hypothyroidism, myxedema coma Patient transitioned to PO synthroid.   #Acute respiratory failure. Patient's family has opted for comfort care.  Terminal extubation to be performed on Sunday.   LOS: 10 Regina Jackson [id number] [place], [place]

## 2022-02-22 NOTE — Progress Notes (Signed)
°   02/26/2022 1000  Clinical Encounter Type  Visited With Patient  Visit Type Initial  Referral From Nurse  Consult/Referral To Chaplain   Chaplain Quinntin Malter responded to consult for EOL. No family present. Provided silent prayer. When family arrives please page on call chaplain to follow up.

## 2022-02-22 NOTE — Progress Notes (Signed)
Actively Dying and Death Visit.  Provided Emotional and Spiritual support per family request including prayer and comfort touch blessing. Family described Regina Jackson as loving, stubborn, did things her way. Offered support at time of death. Family will be using [name]. Family reports arrangements are in place.

## 2022-02-22 NOTE — Consult Note (Signed)
Pharmacy Antibiotic Note  Regina Jackson is a 66 y.o. female with medical history including ESRD on HD, hypothyroidism, anemia of chronic disease, endometrial cancer, HTN, diabetes admitted on 01/10/2022 with  acute metabolic encephalopathy in setting of severe metabolic derangements s/t missed hemodialysis, hypothyroidism . Patient ultimately developed acute respiratory distress requiring intubation and mechanical ventilation.  Pharmacy has been consulted for vancomycin dosing.  Today, 02/04/22  --Palliative care meeting with family yesterday. Plan is for one way extubation on 2/5 --Per nephrology note, no further HD given GOC --WBC continues to climb, patient with fevers overnight  Plan:  Discussed with PCCM during rounds; given Colon with planned one way extubation on 2/5 and no further plans for dialysis will discontinue vancomycin at this point with no plans for further dosing  Height: 5' 7.52" (171.5 cm) Weight: 127.8 kg (281 lb 12 oz) IBW/kg (Calculated) : 62.8  Temp (24hrs), Avg:100.2 F (37.9 C), Min:98.8 F (37.1 C), Max:101.6 F (38.7 C)  Recent Labs  Lab 01/23/22 0407 01/24/22 0427 01/25/22 0422 01/26/22 0503 01/26/22 0743 02/04/22 0506  WBC 20.2* 24.0* 21.3* 29.3*  --  35.3*  CREATININE 7.47* 5.14* 6.06* 3.96*  --  5.11*  VANCORANDOM  --   --   --   --  14  --      Estimated Creatinine Clearance: 15.8 mL/min (A) (by C-G formula based on SCr of 5.11 mg/dL (H)).    Allergies  Allergen Reactions   Amoxicillin    Augmentin [Amoxicillin-Pot Clavulanate]    Ceftin [Cefuroxime]    Compazine [Prochlorperazine]    Demerol [Meperidine Hcl]    Macrobid [Nitrofurantoin]    Phenergan [Promethazine]    Sulfa Antibiotics     Antimicrobials this admission: Vancomycin 1/29 >>   Dose adjustments this admission: N/A  Microbiology results: 1/27 MRSA PCR: (-) 1/28 Sputum: Staphylococcus aureus, pan sensitive   Thank you for allowing pharmacy to be a part of this  patients care.  Benita Gutter Feb 04, 2022 12:12 PM

## 2022-02-22 NOTE — Progress Notes (Signed)
NAME:  Regina Jackson, MRN:  [id number], DOB:  03-Mar-1956, LOS: 45 ADMISSION DATE:  01/12/2022, CONSULTATION DATE:  01/20/22 REFERRING MD:  Dr. Mal Misty, CHIEF COMPLAINT:  AMS, Acute Respiratory Distress   Brief Pt Description / Synopsis:  66 y.o. Female with PMH significant for ESRD on HD, admitted with Acute Metabolic Encephalopathy due to multiple severe metabolic derangements in setting of missed HD sessions and Hypothyroidism (query ? Myxedema coma).  Developed acute respiratory distress requiring intubation and mechanical ventilation. Complicated by severe acute strokes and brain damage  Significant labs: Sodium 134, bicarbonate 16, glucose 211, BUN 49, creatinine 5.9, alkaline phosphatase 132, BNP 329, high-sensitivity troponin 42, WBC 11.3, hemoglobin 10.9, TSH 58.8 Venous blood gas: pH 7.3/PCO2 33/PO2 54/bicarbonate 16.2 COVID-19 PCR negative Imaging: Chest x-ray:Mild cardiomegaly with vascular congestion. Possible small left effusion. No consolidation or pneumothorax CT head:IMPRESSION: 1. No acute intracranial abnormality. 2. Mild chronic small vessel ischemia.   Pertinent  Medical History  ESRD on hemodialysis Noncompliance with dialysis Anemia of chronic disease Endometrial cancer Hypothyroidism Hypertension diabetes mellitus  Micro Data:  1/24: SARS-CoV-2 and influenza PCR>> negative 1/25: HIV screen>> nonreactive 1/27: MRSA PCR>> negative 1/27: Tracheal aspirate>>abundant GPC, Staph aureus, sensitivities pending  Antimicrobials:  1/29 vancomycin>>  Significant Hospital Events: Including procedures, antibiotic start and stop dates in addition to other pertinent events   1/24: Admitted by the hospitalist for altered mental status and hypertensive urgency.  Nephrology consulted 1/25: Unable to safely perform dialysis due to severe agitation and confusion.  Plan to reassess HD tomorrow 1/26: Received IV Haldol and Valium without improvement in mental status.  Psychiatry  consulted 1/27: Required emergent intubation due to AMS and Acute Respiratory Distress.  MRI and EEG pending, Neuro consulted.  Plan for HD now that pt is sedated 1/28 remains on vent,poorly responsive on SAT, MRI today 1/29 copious ETT secretions, started Levaquin (allergies to ampicillin/Unasyn), cardiogram done, pending 1/30 MRI +for acute CVA, severe resp failure 1/31 remains on vent and pressors 2/1 MRI shows multiple new strokes, resp failure 2/2 severe brain damage on exam, multiple strokes 2/3 remains on vent, plan CC over the weekend Interim History / Subjective:   Prognosis is grave On pressors Plan for CC on Sunday  MRI shows multiple new strokes severe brain damage at this time Remains critically ill  Objective   Blood pressure (!) 108/54, pulse (!) 102, temperature 99.8 F (37.7 C), temperature source Axillary, resp. rate (!) 26, height 5' 7.52" (1.715 m), weight 127.8 kg, SpO2 93 %.    Vent Mode: PRVC FiO2 (%):  [35 %-50 %] 50 % Set Rate:  [20 bmp] 20 bmp Vt Set:  [500 mL] 500 mL PEEP:  [8 cmH20] 8 cmH20   Intake/Output Summary (Last 24 hours) at 01-30-22 0755 Last data filed at 01/30/22 0600 Gross per 24 hour  Intake 1981.42 ml  Output 40 ml  Net 1941.42 ml    Filed Weights   01/25/22 0945 01/25/22 1315 January 30, 2022 0600  Weight: 118.6 kg 117.5 kg 127.8 kg     REVIEW OF SYSTEMS  PATIENT IS UNABLE TO PROVIDE COMPLETE REVIEW OF SYSTEMS DUE TO SEVERE CRITICAL ILLNESS AND TOXIC METABOLIC ENCEPHALOPATHY    PHYSICAL EXAMINATION:  GENERAL:critically ill appearing, +resp distress EYES: Pupils equal, round, reactive to light.  No scleral icterus.  MOUTH: Moist mucosal membrane. INTUBATED NECK: Supple.  PULMONARY: +rhonchi, +wheezing CARDIOVASCULAR: S1 and S2.  No murmurs  GASTROINTESTINAL: Soft, nontender, -distended. Positive bowel sounds.  MUSCULOSKELETAL: No swelling, clubbing, or  edema.  NEUROLOGIC: obtunded SKIN:intact,warm,dry    Assessment &  Plan:   Acute Hypoxic Respiratory Failure in the setting of severe metabolic derangements Inability to protect airway with acute CVA, STAPH PNEUMONIA with new multiple acute strokes with signs of severe brain damage    Severe ACUTE Hypoxic and Hypercapnic Respiratory Failure -continue Mechanical Ventilator support -Wean Fio2 and PEEP as tolerated -VAP/VENT bundle implementation -will NOT  perform SAT/SBT when respiratory parameters are met   SEPTIC shock SOURCE-pneumonia -use vasopressors to keep MAP>65 as needed -follow ABG and LA as needed +Vancomycin    NEUROLOGY ACUTE TOXIC METABOLIC ENCEPHALOPATHY Signs of significant brain damage Acute Metabolic Encephalopathy in setting of multiple severe metabolic derangements (AKI and uremia, Hypothyroidism with ? Myxedema coma Multiple  acute infarcts Sedation needs in the setting of mechanical ventilation Neurology consult- appreciate input    RENAL -HD as per Nephrology    ENDO - ICU hypoglycemic\Hyperglycemia protocol -check FSBS per protocol Diabetes Mellitus Hypothyroidism, query ? Myxedema Coma PMHx: Radioactive Iodine Thyroid ablation -CBG's q4h; Target range of 140 to 180 -SSI -Follow ICU Hypo/Hyperglycemia protocol -Continue IV Synthroid 100 mg daily (received 200 mg x1 earlier in stay) -Follow TSH and thyroid panel  ELECTROLYTES -follow labs as needed -replace as needed -pharmacy consultation and following   GI GI PROPHYLAXIS as indicated  NUTRITIONAL STATUS DIET-->TF's as tolerated Constipation protocol as indicated  ACUTE ANEMIA- TRANSFUSE AS NEEDED CONSIDER TRANSFUSION  IF HGB<7 DVT PRX with TED/SCD's ONLY    Best Practice (right click and "Reselect all SmartList Selections" daily)   Diet/type: NPO DVT prophylaxis: prophylactic heparin  GI prophylaxis: PPI Lines: Central line and yes and it is still needed Foley:  Yes, and it is still needed Code Status: DNR status Last date of  multidisciplinary goals of care discussion [1/27]    Labs   CBC: Recent Labs  Lab 01/23/22 0407 01/24/22 0427 01/25/22 0422 01/26/22 0503 01-28-2022 0506  WBC 20.2* 24.0* 21.3* 29.3* 35.3*  NEUTROABS  --   --  17.7*  --   --   HGB 8.6* 8.2* 7.5* 7.7* 7.6*  HCT 27.6* 25.4* 23.6* 23.8* 23.9*  MCV 97.5 94.8 94.4 94.1 94.1  PLT 183 196 206 223 200     Basic Metabolic Panel: Recent Labs  Lab 01/21/22 0537 01/22/22 0340 01/23/22 0407 01/24/22 0427 01/25/22 0422 01/26/22 0503 2022/01/28 0506  NA  --  132* 135 130* 127* 131* 126*  K  --  3.6 3.7 3.5 3.6 2.9* 4.1  CL  --  101 99 93* 90* 96* 90*  CO2  --  20* _0 GLUCOSE  --  230* 235* 289* 357* 313* 270*  BUN  --  58* 86* 58* 98* 69* 89*  CREATININE  --  6.22* 7.47* 5.14* 6.06* 3.96* 5.11*  CALCIUM  --  8.8* 8.8* 8.7* 8.9 8.4* 8.9  MG 1.8 1.8 1.7 1.7 2.2  --   --   PHOS  --  6.3* 7.1* 6.0* 5.7* 3.2 5.6*    GFR: Estimated Creatinine Clearance: 15.8 mL/min (A) (by C-G formula based on SCr of 5.11 mg/dL (H)). Recent Labs  Lab 01/20/22 2252 01/21/22 0537 01/22/22 0340 01/23/22 0407 01/24/22 0427 01/25/22 0422 01/26/22 0503 2022/01/28 0506  PROCALCITON 0.56 0.80 1.86  --   --   --   --   --   WBC  --  15.1* 17.8*   < > 24.0* 21.3* 29.3* 35.3*   < > = values in  this interval not displayed.     Liver Function Tests: Recent Labs  Lab 01/20/22 1058 01/21/22 0530 01/22/22 0340 01/23/22 0407 01/26/22 0503 02-14-2022 0506  AST 67*  --   --   --   --   --   ALT 37  --   --   --   --   --   ALKPHOS 114  --   --   --   --   --   BILITOT 0.7  --   --   --   --   --   PROT 6.7  --   --   --   --   --   ALBUMIN 3.0* 2.9* 2.6* 2.3* 1.8* 1.7*    No results for input(s): LIPASE, AMYLASE in the last 168 hours. No results for input(s): AMMONIA in the last 168 hours.   ABG    Component Value Date/Time   PHART 7.18 (LL) 01/23/2022 0318   PCO2ART 43 01/23/2022 0318   PO2ART 84 01/23/2022 0318   HCO3 16.0 (L)  01/23/2022 0318   ACIDBASEDEF 11.7 (H) 01/23/2022 0318   O2SAT 93.1 01/23/2022 0318    HbA1C: Hgb A1c MFr Bld  Date/Time Value Ref Range Status  01/18/2022 06:25 AM 9.2 (H) 4.8 - 5.6 % Final    Comment:    (NOTE)         Prediabetes: 5.7 - 6.4         Diabetes: >6.4         Glycemic control for adults with diabetes: <7.0     CBG: Recent Labs  Lab 01/26/22 1611 01/26/22 1913 01/26/22 2329 14-Feb-2022 0516 02/14/22 0719  GLUCAP 302* 277* 257* 255* 260*    Allergies Allergies  Allergen Reactions   Amoxicillin    Augmentin [Amoxicillin-Pot Clavulanate]    Ceftin [Cefuroxime]    Compazine [Prochlorperazine]    Demerol [Meperidine Hcl]    Macrobid [Nitrofurantoin]    Phenergan [Promethazine]    Sulfa Antibiotics      Medications   Scheduled Meds:  albuterol  2.5 mg Nebulization Q6H   aspirin  81 mg Per Tube Daily   chlorhexidine gluconate (MEDLINE KIT)  15 mL Mouth Rinse BID   Chlorhexidine Gluconate Cloth  6 each Topical Q0600   docusate  100 mg Per Tube BID   feeding supplement (PROSource TF)  90 mL Per Tube TID   free water  30 mL Per Tube Q4H   heparin  5,000 Units Subcutaneous Q8H   insulin aspart  0-20 Units Subcutaneous Q4H   insulin glargine-yfgn  8 Units Subcutaneous BID   levothyroxine  200 mcg Per Tube Q0600   mouth rinse  15 mL Mouth Rinse 10 times per day   midodrine  10 mg Per Tube TID WC   multivitamin  1 tablet Per Tube QHS   pantoprazole (PROTONIX) IV  40 mg Intravenous Daily   polyethylene glycol  17 g Per Tube Daily   sodium chloride flush  10-40 mL Intracatheter Q12H   Continuous Infusions:  sodium chloride     feeding supplement (VITAL AF 1.2 CAL) 1,000 mL (01/25/22 2325)   fentaNYL infusion INTRAVENOUS 200 mcg/hr (14-Feb-2022 0600)   norepinephrine (LEVOPHED) Adult infusion 4 mcg/min (2022-02-14 0600)   vancomycin Stopped (01/23/22 1905)   PRN Meds:.sodium chloride, acetaminophen **OR** acetaminophen, alteplase, fentaNYL, fentaNYL  (SUBLIMAZE) injection, heparin, lidocaine (PF), lidocaine-prilocaine, LORazepam, midazolam, ondansetron **OR** ondansetron (ZOFRAN) IV, pentafluoroprop-tetrafluoroeth, sodium chloride flush, vancomycin, vecuronium  DVT/GI PRX  assessed I Assessed the need for Labs I Assessed the need for Foley I Assessed the need for Central Venous Line Family Discussion when available I Assessed the need for Mobilization I made an Assessment of medications to be adjusted accordingly Safety Risk assessment completed  CASE DISCUSSED IN MULTIDISCIPLINARY ROUNDS WITH ICU TEAM     Critical Care Time devoted to patient care services described in this note is 45 minutes.  Critical care was necessary to treat /prevent imminent and life-threatening deterioration. Overall, patient is critically ill, prognosis is guarded.  Patient with Multiorgan failure and at high risk for cardiac arrest and death.    Corrin Parker, M.D.  [facility]  Medical Director [place] Director [facility] Cardio-Pulmonary Department

## 2022-02-22 DEATH — deceased
